# Patient Record
Sex: Male | Born: 1975 | Race: White | Hispanic: No | Marital: Single | Smoking: Former smoker
Health system: Southern US, Community
[De-identification: ages and names within clinical notes are randomized; demographics above are authoritative.]

## PROBLEM LIST (undated history)

## (undated) DIAGNOSIS — L309 Dermatitis, unspecified: Secondary | ICD-10-CM

## (undated) DIAGNOSIS — K222 Esophageal obstruction: Secondary | ICD-10-CM

## (undated) DIAGNOSIS — T7840XA Allergy, unspecified, initial encounter: Secondary | ICD-10-CM

## (undated) DIAGNOSIS — K219 Gastro-esophageal reflux disease without esophagitis: Secondary | ICD-10-CM

## (undated) DIAGNOSIS — Z8489 Family history of other specified conditions: Secondary | ICD-10-CM

## (undated) DIAGNOSIS — S0300XA Dislocation of jaw, unspecified side, initial encounter: Secondary | ICD-10-CM

## (undated) DIAGNOSIS — F419 Anxiety disorder, unspecified: Secondary | ICD-10-CM

## (undated) HISTORY — PX: HERNIA REPAIR: SHX51

## (undated) HISTORY — DX: Allergy, unspecified, initial encounter: T78.40XA

## (undated) HISTORY — DX: Anxiety disorder, unspecified: F41.9

## (undated) HISTORY — DX: Dermatitis, unspecified: L30.9

## (undated) HISTORY — DX: Gastro-esophageal reflux disease without esophagitis: K21.9

## (undated) HISTORY — PX: COLONOSCOPY, ESOPHAGOGASTRODUODENOSCOPY (EGD) AND ESOPHAGEAL DILATION: SHX5781

## (undated) HISTORY — DX: Esophageal obstruction: K22.2

## (undated) HISTORY — PX: ESOPHAGEAL DILATION: SHX303

## (undated) HISTORY — PX: TYMPANOSTOMY TUBE PLACEMENT: SHX32

## (undated) HISTORY — DX: Dislocation of jaw, unspecified side, initial encounter: S03.00XA

---

## 2015-12-23 ENCOUNTER — Encounter: Payer: Self-pay | Admitting: Family Medicine

## 2015-12-23 ENCOUNTER — Ambulatory Visit (INDEPENDENT_AMBULATORY_CARE_PROVIDER_SITE_OTHER): Payer: BC Managed Care – PPO | Admitting: Family Medicine

## 2015-12-23 VITALS — BP 112/73 | HR 71 | Temp 97.8°F | Ht 71.0 in | Wt 191.0 lb

## 2015-12-23 DIAGNOSIS — K9041 Non-celiac gluten sensitivity: Secondary | ICD-10-CM | POA: Diagnosis not present

## 2015-12-23 DIAGNOSIS — K219 Gastro-esophageal reflux disease without esophagitis: Secondary | ICD-10-CM | POA: Diagnosis not present

## 2015-12-23 DIAGNOSIS — Z9109 Other allergy status, other than to drugs and biological substances: Secondary | ICD-10-CM | POA: Diagnosis not present

## 2015-12-23 DIAGNOSIS — Z8719 Personal history of other diseases of the digestive system: Secondary | ICD-10-CM

## 2015-12-23 DIAGNOSIS — F4010 Social phobia, unspecified: Secondary | ICD-10-CM | POA: Diagnosis not present

## 2015-12-23 DIAGNOSIS — L200 Besnier's prurigo: Secondary | ICD-10-CM

## 2015-12-23 DIAGNOSIS — L239 Allergic contact dermatitis, unspecified cause: Secondary | ICD-10-CM

## 2015-12-23 DIAGNOSIS — Z23 Encounter for immunization: Secondary | ICD-10-CM | POA: Diagnosis not present

## 2015-12-23 MED ORDER — TRIAMCINOLONE ACETONIDE 0.5 % EX CREA: 1.0000 "application " | TOPICAL_CREAM | Freq: Two times a day (BID) | CUTANEOUS | Status: DC

## 2015-12-23 MED ORDER — BETAMETHASONE DIPROPIONATE 0.05 % EX CREA: TOPICAL_CREAM | Freq: Two times a day (BID) | CUTANEOUS | Status: DC

## 2015-12-23 MED ORDER — OMEPRAZOLE 20 MG PO CPDR: 20.0000 mg | DELAYED_RELEASE_CAPSULE | Freq: Every day | ORAL | Status: DC

## 2015-12-23 MED ORDER — SERTRALINE HCL 50 MG PO TABS: 50.0000 mg | ORAL_TABLET | Freq: Every day | ORAL | Status: DC

## 2015-12-23 MED ORDER — PROPRANOLOL HCL 40 MG PO TABS: 40.0000 mg | ORAL_TABLET | Freq: Three times a day (TID) | ORAL | Status: DC | PRN

## 2015-12-23 NOTE — Progress Notes (Signed)
BP 112/73 mmHg  Pulse 71  Temp(Src) 97.8 F (36.6 C)  Ht 5\' 11"  (1.803 m)  Wt 191 lb (86.637 kg)  BMI 26.65 kg/m2  SpO2 97%   Subjective:    Patient ID: Joe Patterson, male    DOB: 08/26/1976, 40 y.o.   MRN: [id number]  HPI: Joe RumpsScott W Hollander is a 40 y.o. male  Chief Complaint  Patient presents with  . Establish Care    He just needs to get reestablished and refills.   He has had eczema for maybe ten years; cold weather flares it up; gluten sensitivity flares it up too; betamethasone works well on elbows and knees; has skin allergies, has sensitive skin, uses free and clear laundry detergent; manmade chemicals are worse than pollen, etc.; he has the triamcinolone which is weaker for the eczema on the ears or in other areas on the summer  He has severe allergies, and is exquisitely sensitive to smells including strong colognes, after-shaves, smoke, other smells; letter rquests he be quartered by himself, private quarters; takes zyrtec 10 mg OTC daily; not using nasal spray; the strong colognes and after-shaves are the worst; had to be with a partner on a ride, Public relations account executivecorrectional officer; actually vomited from a severe reaction once in a car; he brought in a letter from his previous provider and asked if I could write something similar (see letter, submitted for scanning, should be found later under media tab)  He has reflux and had an esophageal stricture; he was told to take the PPI forever; he was told to keep on taking it, even if not bothering him because the stricture could come back; trying to eat Paleo with plenty of vegetables; no blood in the stools  He takes propranolol; used to be a Engineer, 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  Review of Systems Per HPI unless specifically indicated above     Objective:    BP 112/73 mmHg  Pulse 71  Temp(Src) 97.8 F (36.6 C)  Ht 5\' 11"  (1.803 m)  Wt 191 lb (86.637 kg)  BMI 26.65 kg/m2  SpO2 97%  Wt Readings from Last 3 Encounters:  12/23/15 191 lb (86.637 kg)    Physical Exam  Constitutional: He appears well-developed and well-nourished. No distress.  HENT:  Head: Normocephalic and atraumatic.  Eyes: EOM are normal. No scleral icterus.  Neck: No thyromegaly present.  Cardiovascular: Normal rate and regular rhythm.   Pulmonary/Chest: Effort normal and breath sounds normal.  Abdominal: Soft. Bowel sounds are normal. He exhibits no distension.  Musculoskeletal: He exhibits no edema.  Neurological: Coordination normal.  Skin: Skin is warm and dry. No pallor.  Psychiatric: He has a normal mood and affect. His behavior is normal. Judgment and thought content normal.      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    And strong odors/chemicals; letter written so patient can avoid/minimize exposure to offending agents      History of esophageal stricture    Discussed risk of long-term use of PPIs; continue, with caveat to consume adequate Mg2+, Ca2+, iron; no red flags at this time      Non-celiac gluten sensitivity    Patient denies celiac disease; avoiding gluten in diet       Other Visit Diagnoses    Need for Tdap vaccination        given today;  tetanus-diphtheria components good for up to 10 years, pertussis booster good for life    Relevant Orders    Tdap vaccine greater than or equal to 7yo IM (Completed)       Follow up plan: No Follow-up on file.  An after-visit summary was printed and given to the patient at check-out.  Please see the patient instructions which may contain other information and recommendations beyond what is mentioned above in the assessment and plan. Meds ordered this encounter  Medications  . DISCONTD: sertraline (ZOLOFT) 50 MG tablet    Sig: Take 50 mg by mouth daily.  Marland Kitchen DISCONTD: betamethasone dipropionate (DIPROLENE) 0.05 % cream    Sig: Apply topically 2 (two) times daily.   Marland Kitchen DISCONTD: omeprazole (PRILOSEC) 20 MG capsule    Sig: Take 20 mg by mouth daily.  Marland Kitchen DISCONTD: triamcinolone cream (KENALOG) 0.5 %    Sig: Apply 1 application topically 2 (two) times daily.  Marland Kitchen DISCONTD: propranolol (INDERAL) 40 MG tablet    Sig: Take 40 mg by mouth as needed.  . cetirizine (ZYRTEC) 10 MG tablet    Sig: Take 10 mg by mouth daily.  Marland Kitchen triamcinolone cream (KENALOG) 0.5 %    Sig: Apply 1 application topically 2 (two) times daily.    Dispense:  30 g    Refill:  11    Okay for either 90 day or 30 day supply, whichever insurance will cover  . sertraline (ZOLOFT) 50 MG tablet    Sig: Take 1 tablet (50 mg total) by mouth daily.    Dispense:  30 tablet    Refill:  11    Okay for either 90 day or 30 day supply, whichever insurance will cover  . propranolol (INDERAL) 40 MG tablet    Sig: Take 1 tablet (40 mg total) by mouth every 8 (eight) hours as needed.    Dispense:  30 tablet    Refill:  11    Okay for either 90 day or 30 day supply, whichever insurance will cover  . omeprazole (PRILOSEC) 20 MG capsule    Sig: Take 1 capsule (20 mg total) by mouth daily.    Dispense:  30 capsule    Refill:  11    Okay for either 90 day or 30 day supply, whichever insurance will cover  . betamethasone dipropionate  (DIPROLENE) 0.05 % cream    Sig: Apply topically 2 (two) times daily.    Dispense:  30 g    Refill:  11    Okay for either 90 day or 30 day supply, whichever insurance will cover   Face-to-face time with patient was more than 30 minutes, >50% time spent counseling and coordination of care

## 2015-12-23 NOTE — Patient Instructions (Signed)
Return for physical when due Your refills have been sent to your pharmacy

## 2015-12-29 DIAGNOSIS — K9041 Non-celiac gluten sensitivity: Secondary | ICD-10-CM

## 2015-12-29 DIAGNOSIS — F4010 Social phobia, unspecified: Secondary | ICD-10-CM | POA: Insufficient documentation

## 2015-12-29 DIAGNOSIS — L239 Allergic contact dermatitis, unspecified cause: Secondary | ICD-10-CM | POA: Insufficient documentation

## 2015-12-29 DIAGNOSIS — K219 Gastro-esophageal reflux disease without esophagitis: Secondary | ICD-10-CM | POA: Insufficient documentation

## 2015-12-29 DIAGNOSIS — Z8719 Personal history of other diseases of the digestive system: Secondary | ICD-10-CM | POA: Insufficient documentation

## 2015-12-29 DIAGNOSIS — Z9109 Other allergy status, other than to drugs and biological substances: Secondary | ICD-10-CM | POA: Insufficient documentation

## 2015-12-29 HISTORY — DX: Non-celiac gluten sensitivity: K90.41

## 2015-12-29 NOTE — Assessment & Plan Note (Signed)
Refills provided of beta-blocker and SSRI; continue same, working well per patient

## 2015-12-29 NOTE — Assessment & Plan Note (Signed)
Topical agents, avoidance of offenders when possible

## 2015-12-29 NOTE — Assessment & Plan Note (Signed)
Patient denies celiac disease; avoiding gluten in diet

## 2015-12-29 NOTE — Assessment & Plan Note (Signed)
Discussed risk of long-term use of PPIs; continue, with caveat to consume adequate Mg2+, Ca2+, iron; no red flags at this time

## 2015-12-29 NOTE — Assessment & Plan Note (Signed)
And strong odors/chemicals; letter written so patient can avoid/minimize exposure to offending agents

## 2016-06-22 ENCOUNTER — Ambulatory Visit
Admission: RE | Admit: 2016-06-22 | Discharge: 2016-06-22 | Disposition: A | Discharge status: Home / Self Care | Payer: Managed Care, Other (non HMO) | Source: Ambulatory Visit | Attending: Family Medicine | Admitting: Family Medicine

## 2016-06-22 ENCOUNTER — Ambulatory Visit (INDEPENDENT_AMBULATORY_CARE_PROVIDER_SITE_OTHER): Payer: Managed Care, Other (non HMO) | Admitting: Family Medicine

## 2016-06-22 ENCOUNTER — Encounter: Payer: Self-pay | Admitting: Family Medicine

## 2016-06-22 VITALS — BP 114/64 | HR 77 | Temp 97.7°F | Resp 14 | Wt 193.0 lb

## 2016-06-22 DIAGNOSIS — M25511 Pain in right shoulder: Secondary | ICD-10-CM | POA: Diagnosis not present

## 2016-06-22 DIAGNOSIS — M25512 Pain in left shoulder: Secondary | ICD-10-CM | POA: Diagnosis present

## 2016-06-22 DIAGNOSIS — M26609 Unspecified temporomandibular joint disorder, unspecified side: Secondary | ICD-10-CM

## 2016-06-22 DIAGNOSIS — L720 Epidermal cyst: Secondary | ICD-10-CM | POA: Diagnosis not present

## 2016-06-22 DIAGNOSIS — Z0000 Encounter for general adult medical examination without abnormal findings: Secondary | ICD-10-CM | POA: Diagnosis not present

## 2016-06-22 HISTORY — DX: Epidermal cyst: L72.0

## 2016-06-22 LAB — CBC WITH DIFFERENTIAL/PLATELET
Basophils Absolute: 0 cells/uL (ref 0–200)
Basophils Relative: 0 %
Eosinophils Absolute: 110 cells/uL (ref 15–500)
Eosinophils Relative: 2 %
HEMATOCRIT: 41.9 % (ref 38.5–50.0)
HEMOGLOBIN: 13.8 g/dL (ref 13.2–17.1)
LYMPHS ABS: 1155 {cells}/uL (ref 850–3900)
Lymphocytes Relative: 21 %
MCH: 29.6 pg (ref 27.0–33.0)
MCHC: 32.9 g/dL (ref 32.0–36.0)
MCV: 89.9 fL (ref 80.0–100.0)
MPV: 10.2 fL (ref 7.5–12.5)
Monocytes Absolute: 440 cells/uL (ref 200–950)
Monocytes Relative: 8 %
NEUTROS PCT: 69 %
Neutro Abs: 3795 cells/uL (ref 1500–7800)
Platelets: 270 10*3/uL (ref 140–400)
RBC: 4.66 MIL/uL (ref 4.20–5.80)
RDW: 13.5 % (ref 11.0–15.0)
WBC: 5.5 10*3/uL (ref 3.8–10.8)

## 2016-06-22 LAB — COMPREHENSIVE METABOLIC PANEL
ALK PHOS: 55 U/L (ref 40–115)
ALT: 26 U/L (ref 9–46)
AST: 22 U/L (ref 10–40)
Albumin: 5 g/dL (ref 3.6–5.1)
BILIRUBIN TOTAL: 0.4 mg/dL (ref 0.2–1.2)
BUN: 8 mg/dL (ref 7–25)
CALCIUM: 9.2 mg/dL (ref 8.6–10.3)
CO2: 24 mmol/L (ref 20–31)
Chloride: 106 mmol/L (ref 98–110)
Creat: 0.78 mg/dL (ref 0.60–1.35)
GLUCOSE: 86 mg/dL (ref 65–99)
Potassium: 4.3 mmol/L (ref 3.5–5.3)
SODIUM: 140 mmol/L (ref 135–146)
Total Protein: 6.7 g/dL (ref 6.1–8.1)

## 2016-06-22 LAB — LIPID PANEL
Cholesterol: 153 mg/dL (ref 125–200)
HDL: 44 mg/dL (ref >=40)
LDL CALC: 93 mg/dL (ref <130)
Total CHOL/HDL Ratio: 3.5 Ratio (ref <=5.0)
Triglycerides: 81 mg/dL (ref <150)
VLDL: 16 mg/dL (ref <30)

## 2016-06-22 NOTE — Progress Notes (Signed)
Patient ID: Joe Patterson, male   DOB: 04/06/1976, 40 y.o.   MRN: [id number]   Subjective:   Joe Patterson is a 40 y.o. male here for a complete physical exam  Interim issues since last visit: tightness in the right shoulder for 24 hours; maybe arthritis; discomfort in other shoulder too; used to sleep on his right side and had trouble for years, and then started sleeping on the left side, and now having trouble with the left shoulder; takes tylenol or ibuprofen for relief; today it's not so bad to need something; mother has a lot of arthritis, lots of joint problems; mother has TMJ; willing to do xrays; does weight lifting and doing overhead presses with dumbbells hurts; no limiting of ROM; hurts later after weight-lifting; no weakness in arms; no electricity or numbness in arms; does a lot of typing during the day; wears wrist braces at night, helping; ibuprofen does not upset stomach  USPSTF grade A and B recommendations Alcohol: no alcohol, none in 16 years Depression:  [facility] screen [facility]/22/2016  Decreased Interest 0  Down, Depressed, Hopeless 0  PHQ - 2 Score 0   Hypertension: well-controlled Obesity: none Tobacco use: former smoker, quit 9 years ago; used nicotine gum then regular gum, chews all the time HIV: declined STD testing and prevention (chl/gon/syphilis): declined Lipids: fasting Glucose: fasting Colorectal cancer: uncle has Crohn's disease Aspirin: n/a Diet: good eater Exercise: weight-lift, warm-ups, jumping jacks to get warmed up Skin cancer: nothing worrisome  Past Medical History  Diagnosis Date  . Anxiety   . Eczema   . GERD (gastroesophageal reflux disease)   . TMJ (dislocation of temporomandibular joint)   . Esophageal stricture    Past Surgical History  Procedure Laterality Date  . Hernia repair    . Tympanostomy tube placement      as a child   Family History  Problem Relation Age of Onset  . Cancer Maternal Grandmother    lymphoma  . Heart disease Maternal Grandfather     early 6440s  . Lung disease Paternal Grandmother   . COPD Neg Hx   . Diabetes Neg Hx   . Hypertension Neg Hx   . Stroke Neg Hx    Social History  Substance Use Topics  . Smoking status: Former Smoker -- 1.00 packs/day for 15 years    Types: Cigarettes    Quit date: 12/23/2007  . Smokeless tobacco: Former NeurosurgeonUser    Types: Chew  . Alcohol Use: No   Review of Systems  Respiratory: Negative for shortness of breath.   Cardiovascular: Negative for chest pain.  Musculoskeletal: Positive for arthralgias (shoulder pain bilaterally).    Objective:   Filed Vitals:   06/22/16 0829  BP: 114/64  Pulse: 77  Temp: 97.7 F (36.5 C)  TempSrc: Oral  Resp: 14  Weight: 193 lb (87.544 kg)  SpO2: 94%   Body mass index is 26.93 kg/(m^2). Wt Readings from Last 3 Encounters:  06/22/16 193 lb (87.544 kg)  12/23/15 191 lb (86.637 kg)   Physical Exam  Constitutional: He appears well-developed and well-nourished. No distress.  HENT:  Head: Normocephalic and atraumatic.  Nose: Nose normal.  Mouth/Throat: Oropharynx is clear and moist.  Eyes: EOM are normal. No scleral icterus.  Neck: No JVD present. No thyromegaly present.  Cardiovascular: Normal rate, regular rhythm and normal heart sounds.   Pulmonary/Chest: Effort normal and breath sounds normal. No respiratory distress. He has no wheezes. He has no rales.  Abdominal: Soft. Bowel sounds are normal. He exhibits no distension. There is no tenderness. There is no guarding.  Musculoskeletal: Normal range of motion. He exhibits no edema.       Right shoulder: He exhibits crepitus. He exhibits normal range of motion, no tenderness and no swelling.       Left shoulder: He exhibits crepitus. He exhibits normal range of motion, no tenderness and no swelling.  Lymphadenopathy:    He has no cervical adenopathy.  Neurological: He is alert. He displays normal reflexes. He exhibits normal muscle tone.  Coordination normal.  Skin: Skin is warm and dry. No rash noted. He is not diaphoretic. No erythema. No pallor.  cystic lesion upper center back; two oval lesions upper right side back with erythema (patient reports these are unchanged for years)  Psychiatric: He has a normal mood and affect. His behavior is normal. Judgment and thought content normal.   Assessment/Plan:   Problem List Items Addressed This Visit      Musculoskeletal and Integument   TMJ (temporomandibular joint syndrome)    Try turmeric as an anti-inflammatory; patient already aware that chewing gum not helpful        Other   Shoulder pain, bilateral    Suspect arthritis; offered xrays; suggested turmeric, tylenol, topicals, temperature (heat or ice)      Relevant Orders   DG Shoulder Left (Completed)   DG Shoulder Right (Completed)   Preventative health care - Primary    USPSTF grade A and B recommendations reviewed with patient; age-appropriate recommendations, preventive care, screening tests, etc discussed and encouraged; healthy living encouraged; see AVS for patient education given to patient      Relevant Orders   Comprehensive Metabolic Panel (CMET) (Completed)   Lipid panel (Completed)   CBC with Differential/Platelet (Completed)   Cyst of skin and subcutaneous tissue    Large cyst upper central back; two other lesions which appear different upper right side back; refer to dermatologist      Relevant Orders   Ambulatory referral to Dermatology      No orders of the defined types were placed in this encounter.   Orders Placed This Encounter  Procedures  . DG Shoulder Left    Order Specific Question:  Reason for Exam (SYMPTOM  OR DIAGNOSIS REQUIRED)    Answer:  bilateral shoulder pain, suspect OA    Order Specific Question:  Preferred imaging location?    Answer:  [facility]  . DG Shoulder Right    Order Specific Question:  Reason for Exam (SYMPTOM  OR DIAGNOSIS REQUIRED)    Answer:   bilateral shoulder pain, suspect OA    Order Specific Question:  Preferred imaging location?    Answer:  [facility]  . Comprehensive Metabolic Panel (CMET)    Order Specific Question:  Has the patient fasted?    Answer:  Yes  . Lipid panel    Order Specific Question:  Has the patient fasted?    Answer:  Yes  . CBC with Differential/Platelet  . Ambulatory referral to Dermatology    Referral Priority:  Routine    Referral Type:  Consultation    Referral Reason:  Specialty Services Required    Requested Specialty:  Dermatology    Number of Visits Requested:  1    Follow up plan: Return in about 1 year (around 06/22/2017) for complete physical.  An After Visit Summary was printed and given to the patient.

## 2016-06-22 NOTE — Assessment & Plan Note (Signed)
Suspect arthritis; offered xrays; suggested turmeric, tylenol, topicals, temperature (heat or ice)

## 2016-06-22 NOTE — Patient Instructions (Addendum)
Let's get xrays of the shoulders today  Try turmeric as a natural anti-inflammatory (for pain and arthritis). It comes in capsules where you buy aspirin and fish oil, but also as a spice where you buy pepper and garlic powder.  Let's get fasting labs today  Health Maintenance, Male A healthy lifestyle and preventative care can promote health and wellness.  Maintain regular health, dental, and eye exams.  Eat a healthy diet. Foods like vegetables, fruits, whole grains, low-fat dairy products, and lean protein foods contain the nutrients you need and are low in calories. Decrease your intake of foods high in solid fats, added sugars, and salt. Get information about a proper diet from your health care provider, if necessary.  Regular physical exercise is one of the most important things you can do for your health. Most adults should get at least 150 minutes of moderate-intensity exercise (any activity that increases your heart rate and causes you to sweat) each week. In addition, most adults need muscle-strengthening exercises on 2 or more days a week.   Maintain a healthy weight. The body mass index (BMI) is a screening tool to identify possible weight problems. It provides an estimate of body fat based on height and weight. Your health care provider can find your BMI and can help you achieve or maintain a healthy weight. For males 20 years and older:  A BMI below 18.5 is considered underweight.  A BMI of 18.5 to 24.9 is normal.  A BMI of 25 to 29.9 is considered overweight.  A BMI of 30 and above is considered obese.  Maintain normal blood lipids and cholesterol by exercising and minimizing your intake of saturated fat. Eat a balanced diet with plenty of fruits and vegetables. Blood tests for lipids and cholesterol should begin at age 39 and be repeated every 5 years. If your lipid or cholesterol levels are high, you are over age 73, or you are at high risk for heart disease, you may need  your cholesterol levels checked more frequently.Ongoing high lipid and cholesterol levels should be treated with medicines if diet and exercise are not working.  If you smoke, find out from your health care provider how to quit. If you do not use tobacco, do not start.  Lung cancer screening is recommended for adults aged 55-80 years who are at high risk for developing lung cancer because of a history of smoking. A yearly low-dose CT scan of the lungs is recommended for people who have at least a 30-pack-year history of smoking and are current smokers or have quit within the past 15 years. A pack year of smoking is smoking an average of 1 pack of cigarettes a day for 1 year (for example, a 30-pack-year history of smoking could mean smoking 1 pack a day for 30 years or 2 packs a day for 15 years). Yearly screening should continue until the smoker has stopped smoking for at least 15 years. Yearly screening should be stopped for people who develop a health problem that would prevent them from having lung cancer treatment.  If you choose to drink alcohol, do not have more than 2 drinks per day. One drink is considered to be 12 oz (360 mL) of beer, 5 oz (150 mL) of wine, or 1.5 oz (45 mL) of liquor.  Avoid the use of street drugs. Do not share needles with anyone. Ask for help if you need support or instructions about stopping the use of drugs.  High blood pressure  causes heart disease and increases the risk of stroke. High blood pressure is more likely to develop in:  People who have blood pressure in the end of the normal range (100-139/85-89 mm Hg).  People who are overweight or obese.  People who are African American.  If you are 2118-39 years of age, have your blood pressure checked every 3-5 years. If you are 39 years of age or older, have your blood pressure checked every year. You should have your blood pressure measured twice--once when you are at a hospital or clinic, and once when you are not  at a hospital or clinic. Record the average of the two measurements. To check your blood pressure when you are not at a hospital or clinic, you can use:  An automated blood pressure machine at a pharmacy.  A home blood pressure monitor.  If you are 8245-39 years old, ask your health care provider if you should take aspirin to prevent heart disease.  Diabetes screening involves taking a blood sample to check your fasting blood sugar level. This should be done once every 3 years after age 39 if you are at a normal weight and without risk factors for diabetes. Testing should be considered at a younger age or be carried out more frequently if you are overweight and have at least 1 risk factor for diabetes.  Colorectal cancer can be detected and often prevented. Most routine colorectal cancer screening begins at the age of 39 and continues through age 39. However, your health care provider may recommend screening at an earlier age if you have risk factors for colon cancer. On a yearly basis, your health care provider may provide home test kits to check for hidden blood in the stool. A small camera at the end of a tube may be used to directly examine the colon (sigmoidoscopy or colonoscopy) to detect the earliest forms of colorectal cancer. Talk to your health care provider about this at age 39 when routine screening begins. A direct exam of the colon should be repeated every 5-10 years through age 39, unless early forms of precancerous polyps or small growths are found.  People who are at an increased risk for hepatitis B should be screened for this virus. You are considered at high risk for hepatitis B if:  You were born in a country where hepatitis B occurs often. Talk with your health care provider about which countries are considered high risk.  Your parents were born in a high-risk country and you have not received a shot to protect against hepatitis B (hepatitis B vaccine).  You have HIV or  AIDS.  You use needles to inject street drugs.  You live with, or have sex with, someone who has hepatitis B.  You are a man who has sex with other men (MSM).  You get hemodialysis treatment.  You take certain medicines for conditions like cancer, organ transplantation, and autoimmune conditions.  Hepatitis C blood testing is recommended for all people born from [id number] through 1965 and any individual with known risk factors for hepatitis C.  Healthy men should no longer receive prostate-specific antigen (PSA) blood tests as part of routine cancer screening. Talk to your health care provider about prostate cancer screening.  Testicular cancer screening is not recommended for adolescents or adult males who have no symptoms. Screening includes self-exam, a health care provider exam, and other screening tests. Consult with your health care provider about any symptoms you have or any concerns you have  about testicular cancer.  Practice safe sex. Use condoms and avoid high-risk sexual practices to reduce the spread of sexually transmitted infections (STIs).  You should be screened for STIs, including gonorrhea and chlamydia if:  You are sexually active and are younger than 24 years.  You are older than 24 years, and your health care provider tells you that you are at risk for this type of infection.  Your sexual activity has changed since you were last screened, and you are at an increased risk for chlamydia or gonorrhea. Ask your health care provider if you are at risk.  If you are at risk of being infected with HIV, it is recommended that you take a prescription medicine daily to prevent HIV infection. This is called pre-exposure prophylaxis (PrEP). You are considered at risk if:  You are a man who has sex with other men (MSM).  You are a heterosexual man who is sexually active with multiple partners.  You take drugs by injection.  You are sexually active with a partner who has  HIV.  Talk with your health care provider about whether you are at high risk of being infected with HIV. If you choose to begin PrEP, you should first be tested for HIV. You should then be tested every 3 months for as long as you are taking PrEP.  Use sunscreen. Apply sunscreen liberally and repeatedly throughout the day. You should seek shade when your shadow is shorter than you. Protect yourself by wearing long sleeves, pants, a wide-brimmed hat, and sunglasses year round whenever you are outdoors.  Tell your health care provider of new moles or changes in moles, especially if there is a change in shape or color. Also, tell your health care provider if a mole is larger than the size of a pencil eraser.  A one-time screening for abdominal aortic aneurysm (AAA) and surgical repair of large AAAs by ultrasound is recommended for men aged 65-75 years who are current or former smokers.  Stay current with your vaccines (immunizations).   This information is not intended to replace advice given to you by your health care provider. Make sure you discuss any questions you have with your health care provider.   Document Released: 05/26/2008 Document Revised: 12/19/2014 Document Reviewed: 04/25/2011 Elsevier Interactive Patient Education [employer]

## 2016-06-23 NOTE — Assessment & Plan Note (Signed)
Try turmeric as an anti-inflammatory; patient already aware that chewing gum not helpful

## 2016-06-23 NOTE — Assessment & Plan Note (Signed)
Large cyst upper central back; two other lesions which appear different upper right side back; refer to dermatologist

## 2016-06-23 NOTE — Assessment & Plan Note (Signed)
USPSTF grade A and B recommendations reviewed with patient; age-appropriate recommendations, preventive care, screening tests, etc discussed and encouraged; healthy living encouraged; see AVS for patient education given to patient  

## 2016-10-06 ENCOUNTER — Ambulatory Visit (INDEPENDENT_AMBULATORY_CARE_PROVIDER_SITE_OTHER): Payer: Managed Care, Other (non HMO) | Admitting: Family Medicine

## 2016-10-06 ENCOUNTER — Encounter: Payer: Self-pay | Admitting: Family Medicine

## 2016-10-06 VITALS — BP 128/82 | HR 93 | Temp 100.7°F | Resp 14 | Ht 71.0 in | Wt 201.4 lb

## 2016-10-06 DIAGNOSIS — R509 Fever, unspecified: Secondary | ICD-10-CM | POA: Diagnosis not present

## 2016-10-06 DIAGNOSIS — J111 Influenza due to unidentified influenza virus with other respiratory manifestations: Secondary | ICD-10-CM

## 2016-10-06 DIAGNOSIS — R69 Illness, unspecified: Secondary | ICD-10-CM | POA: Diagnosis not present

## 2016-10-06 MED ORDER — OSELTAMIVIR PHOSPHATE 75 MG PO CAPS: 75.0000 mg | ORAL_CAPSULE | Freq: Two times a day (BID) | ORAL | 0 refills | Status: AC

## 2016-10-06 NOTE — Progress Notes (Signed)
BP 128/82 (BP Location: Left Arm, Patient Position: Sitting, Cuff Size: Normal)   Pulse 93   Temp (!) 100.7 F (38.2 C) (Oral)   Resp 14   Ht '5\' 11"'  (1.803 m)   Wt 201 lb 6 oz (91.3 kg)   SpO2 96%   BMI 28.09 kg/m    Subjective:    Patient ID: Joe Patterson, male    DOB: 24-Aug-1976, 40 y.o.   MRN: [id number]  HPI: Joe Patterson is a 40 y.o. male  Chief Complaint  Patient presents with  . 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 Anxiety   . Eczema   . Esophageal stricture   . GERD (gastroesophageal reflux disease)   . TMJ (dislocation of temporomandibular joint)    Past Surgical History:  Procedure Laterality Date  . HERNIA REPAIR    . TYMPANOSTOMY TUBE PLACEMENT     as a child   Social History  Substance Use Topics  . Smoking status: Former Smoker    Packs/day: 1.00    Years: 15.00    Types: Cigarettes    Quit date: 12/23/2007  . Smokeless tobacco: Former Systems developer    Types: Chew  . Alcohol use No   Interim medical history since last visit reviewed. Allergies and medications reviewed  Review of Systems Per HPI unless  specifically indicated above     Objective:    BP 128/82 (BP Location: Left Arm, Patient Position: Sitting, Cuff Size: Normal)   Pulse 93   Temp (!) 100.7 F (38.2 C) (Oral)   Resp 14   Ht '5\' 11"'  (1.803 m)   Wt 201 lb 6 oz (91.3 kg)   SpO2 96%   BMI 28.09 kg/m   Wt Readings from Last 3 Encounters:  10/06/16 201 lb 6 oz (91.3 kg)  06/22/16 193 lb (87.5 kg)  12/23/15 191 lb (86.6 kg)    Physical Exam  Constitutional: He appears well-developed and well-nourished. No distress.  HENT:  Head: Normocephalic and atraumatic.  Right Ear: External ear normal.  Left Ear: External ear normal.  Nose: Nose normal.  Mouth/Throat: Oropharynx is clear and moist. No oropharyngeal exudate.  Eyes: Conjunctivae and EOM are normal. Right eye exhibits no discharge. Left eye exhibits no discharge. No scleral icterus.  Neck: Normal range of motion. Neck supple. No thyromegaly present.  Cardiovascular: Normal rate and regular rhythm.   No extrasystoles are present.  No murmur heard. Pulmonary/Chest: Effort normal and breath sounds normal. He has no decreased breath sounds. He has no wheezes. He has no rhonchi.  Abdominal: Soft. Bowel  sounds are normal. He exhibits no distension.  Musculoskeletal: He exhibits no edema.  Lymphadenopathy:    He has no cervical adenopathy.  Neurological: He is alert. He displays no tremor. No cranial nerve deficit. Coordination and gait normal.  Negative Kernigs and brudzkinski's signs  Skin: Skin is warm and dry. No rash noted. He is not diaphoretic. No pallor.  Psychiatric: He has a normal mood and affect. His behavior is normal. Judgment and thought content normal.   Results for orders placed or performed in visit on 06/22/16  Comprehensive Metabolic Panel (CMET)  Result Value Ref Range   Sodium 140 135 - 146 mmol/L   Potassium 4.3 3.5 - 5.3 mmol/L   Chloride 106 98 - 110 mmol/L   CO2 24 20 - 31 mmol/L   Glucose, Bld 86 65 - 99 mg/dL   BUN 8 7 - 25 mg/dL    Creat 0.78 0.60 - 1.35 mg/dL   Total Bilirubin 0.4 0.2 - 1.2 mg/dL   Alkaline Phosphatase 55 40 - 115 U/L   AST 22 10 - 40 U/L   ALT 26 9 - 46 U/L   Total Protein 6.7 6.1 - 8.1 g/dL   Albumin 5.0 3.6 - 5.1 g/dL   Calcium 9.2 8.6 - 10.3 mg/dL  Lipid panel  Result Value Ref Range   Cholesterol 153 125 - 200 mg/dL   Triglycerides 81 <150 mg/dL   HDL 44 >=40 mg/dL   Total CHOL/HDL Ratio 3.5 <=5.0 Ratio   VLDL 16 <30 mg/dL   LDL Cholesterol 93 <130 mg/dL  CBC with Differential/Platelet  Result Value Ref Range   WBC 5.5 3.8 - 10.8 K/uL   RBC 4.66 4.20 - 5.80 MIL/uL   Hemoglobin 13.8 13.2 - 17.1 g/dL   HCT 41.9 38.5 - 50.0 %   MCV 89.9 80.0 - 100.0 fL   MCH 29.6 27.0 - 33.0 pg   MCHC 32.9 32.0 - 36.0 g/dL   RDW 13.5 11.0 - 15.0 %   Platelets 270 140 - 400 K/uL   MPV 10.2 7.5 - 12.5 fL   Neutro Abs 3,795 1,500 - 7,800 cells/uL   Lymphs Abs 1,155 850 - 3,900 cells/uL   Monocytes Absolute 440 200 - 950 cells/uL   Eosinophils Absolute 110 15 - 500 cells/uL   Basophils Absolute 0 0 - 200 cells/uL   Neutrophils Relative % 69 %   Lymphocytes Relative 21 %   Monocytes Relative 8 %   Eosinophils Relative 2 %   Basophils Relative 0 %   Smear Review Criteria for review not met       Assessment & Plan:   Problem List Items Addressed This Visit      Other   Fever    influenza-liek illness; reasons to seek help reviewed; start tamiflu; hydration, rest, out of work, contagious until afebrile x 48 hours       Other Visit Diagnoses    Influenza-like illness    -  Primary   staff initially reported positive flu, then said it was negative; will treat with tamiflu given clinical presentation; reviewed warnings; reasons to seek help      Follow up plan: Return if symptoms worsen or fail to improve.  An after-visit summary was printed and given to the patient at [facility].  Please see the patient instructions which may contain other information and recommendations beyond what is  mentioned above in the assessment and plan.  Meds ordered this encounter  Medications  . oseltamivir (TAMIFLU)  75 MG capsule    Sig: Take 1 capsule (75 mg total) by mouth 2 (two) times daily.    Dispense:  10 capsule    Refill:  0

## 2016-10-06 NOTE — Patient Instructions (Addendum)

## 2016-10-07 ENCOUNTER — Ambulatory Visit: Payer: Managed Care, Other (non HMO) | Admitting: Family Medicine

## 2016-10-10 ENCOUNTER — Ambulatory Visit
Admission: RE | Admit: 2016-10-10 | Discharge: 2016-10-10 | Disposition: A | Discharge status: Home / Self Care | Payer: Managed Care, Other (non HMO) | Source: Ambulatory Visit | Attending: Family Medicine | Admitting: Family Medicine

## 2016-10-10 ENCOUNTER — Encounter: Payer: Self-pay | Admitting: Family Medicine

## 2016-10-10 ENCOUNTER — Telehealth: Payer: Self-pay | Admitting: Family Medicine

## 2016-10-10 DIAGNOSIS — R05 Cough: Secondary | ICD-10-CM | POA: Insufficient documentation

## 2016-10-10 DIAGNOSIS — R509 Fever, unspecified: Secondary | ICD-10-CM | POA: Insufficient documentation

## 2016-10-10 DIAGNOSIS — R059 Cough, unspecified: Secondary | ICD-10-CM

## 2016-10-10 NOTE — Assessment & Plan Note (Signed)
xray

## 2016-10-10 NOTE — Telephone Encounter (Signed)
Pt.notified

## 2016-10-10 NOTE — Telephone Encounter (Signed)
Please advise 

## 2016-10-10 NOTE — Telephone Encounter (Signed)
Pt states he came in last Thursday 10/06/16 and was diagnosed with the flu. Pt states he has taken all of the Tamiflu and pt states he still has fever of 101.0 and he is having tightness in chest and feels no better than he did last week. Pt would like a call back to be advised.

## 2016-10-10 NOTE — Telephone Encounter (Signed)
Please thank him for calling Ask him to go to get a CXR across the street and we'll make sure he doesn't have pneumonia Visit to urgent care if worse

## 2016-10-13 NOTE — Assessment & Plan Note (Signed)
influenza-liek illness; reasons to seek help reviewed; start tamiflu; hydration, rest, out of work, contagious until afebrile x 48 hours

## 2016-12-15 ENCOUNTER — Ambulatory Visit: Payer: Managed Care, Other (non HMO) | Admitting: Family Medicine

## 2016-12-15 ENCOUNTER — Ambulatory Visit (INDEPENDENT_AMBULATORY_CARE_PROVIDER_SITE_OTHER): Payer: Managed Care, Other (non HMO) | Admitting: Family Medicine

## 2016-12-15 ENCOUNTER — Encounter: Payer: Self-pay | Admitting: Family Medicine

## 2016-12-15 DIAGNOSIS — F4010 Social phobia, unspecified: Secondary | ICD-10-CM

## 2016-12-15 DIAGNOSIS — K219 Gastro-esophageal reflux disease without esophagitis: Secondary | ICD-10-CM

## 2016-12-15 MED ORDER — TRIAMCINOLONE ACETONIDE 0.5 % EX CREA: 1.0000 "application " | TOPICAL_CREAM | Freq: Two times a day (BID) | CUTANEOUS | 11 refills | Status: DC

## 2016-12-15 MED ORDER — TRIAMCINOLONE ACETONIDE 0.1 % EX CREA: 1.0000 "application " | TOPICAL_CREAM | Freq: Two times a day (BID) | CUTANEOUS | 11 refills | Status: DC

## 2016-12-15 MED ORDER — SERTRALINE HCL 50 MG PO TABS: 50.0000 mg | ORAL_TABLET | Freq: Every day | ORAL | 3 refills | Status: DC

## 2016-12-15 MED ORDER — PROPRANOLOL HCL 40 MG PO TABS: 80.0000 mg | ORAL_TABLET | Freq: Two times a day (BID) | ORAL | 3 refills | Status: DC | PRN

## 2016-12-15 MED ORDER — OMEPRAZOLE 20 MG PO CPDR: 20.0000 mg | DELAYED_RELEASE_CAPSULE | Freq: Every day | ORAL | 3 refills | Status: DC

## 2016-12-15 NOTE — Progress Notes (Signed)
BP 114/82   Pulse 78   Temp 98 F (36.7 C) (Oral)   Resp 14   Wt 197 lb (89.4 kg)   SpO2 95%   BMI 27.48 kg/m    Subjective:    Patient ID: Joe Patterson, male    DOB: 08/09/1976, 41 y.o.   MRN: [id number]  HPI: Joe Patterson is a 41 y.o. male  Chief Complaint  Patient presents with  . Medication Refill  . Follow-up   GERD, esophageal stricture hx; on PPI Went to the dentist and the dentist saw evidence of reflux in his teeth; elevated HOB Silent acid reflux; too many chili beans did cause symptoms once, but mostly silent Hx of esophageal dilatation, maybe 7 years ago; not getting hung up now, nothing similar Food was sticking mid chest, but not now  Anxiety; on sertraline; can't really tell any problems with that; used to take 150 mg daily years ago, managed to get down to 50 mg but did not do well going any lower on the dose; his med list says that he takes propranolol 80 mg three times a day; needing a lot of coffee to do his job, then has meetings and has to take more beta-blocker; if he has an afternoon meeting, then he takes 120 mg in afternoon; he only takes the propranolol only for social anxiety and only at work or parties; he talked to his sister who is a Teacher, early years/prepharmacist and she said it was okay to take it this way he says; he monitors his pulse; does not go below 60, no problems with 120 mg of propranolol at a time  Eczema; under pretty good control; he went to dermatologist, they gave him 0.1% and he would like both, some more mild for face if needed  [facility] 2[facility] 12/15/2016 10/06/2016 06/22/2016  Decreased Interest 0 0 0  Down, Depressed, Hopeless 0 0 0  PHQ - 2 Score 0 0 0   Relevant past medical, surgical, family and social history reviewed Past Medical History:  Diagnosis Date  . Anxiety   . Eczema   . Esophageal stricture   . GERD (gastroesophageal reflux disease)   . TMJ (dislocation of temporomandibular joint)    Past Surgical History:    Procedure Laterality Date  . HERNIA REPAIR    . TYMPANOSTOMY TUBE PLACEMENT     as a child   Family History  Problem Relation Age of Onset  . Cancer Maternal Grandmother     lymphoma  . Heart disease Maternal Grandfather     early 3540s  . Lung disease Paternal Grandmother   . COPD Neg Hx   . Diabetes Neg Hx   . Hypertension Neg Hx   . Stroke Neg Hx    Social History  Substance Use Topics  . Smoking status: Former Smoker    Packs/day: 1.00    Years: 15.00    Types: Cigarettes    Quit date: 12/23/2007  . Smokeless tobacco: Former NeurosurgeonUser    Types: Chew  . Alcohol use No   Interim medical history since last visit reviewed. Allergies and medications reviewed  Review of Systems Per HPI unless specifically indicated above     Objective:    BP 114/82   Pulse 78   Temp 98 F (36.7 C) (Oral)   Resp 14   Wt 197 lb (89.4 kg)   SpO2 95%   BMI 27.48 kg/m   Wt Readings from Last 3 Encounters:  12/15/16  197 lb (89.4 kg)  10/06/16 201 lb 6 oz (91.3 kg)  06/22/16 193 lb (87.5 kg)    Physical Exam  Constitutional: He appears well-developed and well-nourished. No distress.  Eyes: EOM are normal. No scleral icterus.  Cardiovascular: Normal rate, regular rhythm and normal heart sounds.   Pulmonary/Chest: Effort normal and breath sounds normal. No respiratory distress. He has no wheezes. He has no rales.  Abdominal: Soft. Bowel sounds are normal. He exhibits no distension. There is no tenderness. There is no guarding.  Musculoskeletal: Normal range of motion. He exhibits no edema.  Neurological: He is alert. He displays normal reflexes. He exhibits normal muscle tone. Coordination normal.  Skin: Skin is warm and dry. No rash noted. He is not diaphoretic. No pallor.  Psychiatric: He has a normal mood and affect. His behavior is normal. Judgment and thought content normal. His mood appears not anxious. He does not exhibit a depressed mood.      Assessment & Plan:   Problem List  Items Addressed This Visit      Digestive   Gastroesophageal reflux    Continue current therapy with PPI; cautioned about risk; back to GI if s/s of stricture recur      Relevant Medications   omeprazole (PRILOSEC) 20 MG capsule     Other   Social anxiety disorder    Once we clarified how he is taking his propranolol, will continue same PRN dose; cautions given, and I reviewed with him max doses; monitor pulse; he agrees; continue the SSRI          Follow up plan: Return in about 6 months (around 06/14/2017) for complete physical.  An after-visit summary was printed and given to the patient at check-out.  Please see the patient instructions which may contain other information and recommendations beyond what is mentioned above in the assessment and plan.  Meds ordered this encounter  Medications  . omeprazole (PRILOSEC) 20 MG capsule    Sig: Take 1 capsule (20 mg total) by mouth daily.    Dispense:  90 capsule    Refill:  3    Okay for either 90 day or 30 day supply, whichever insurance will cover  . sertraline (ZOLOFT) 50 MG tablet    Sig: Take 1 tablet (50 mg total) by mouth daily.    Dispense:  90 tablet    Refill:  3    Okay for either 90 day or 30 day supply, whichever insurance will cover  . propranolol (INDERAL) 40 MG tablet    Sig: Take 2 tablets (80 mg total) by mouth every 12 (twelve) hours as needed.    Dispense:  200 tablet    Refill:  3    Okay for either 90 day or 30 day supply, whichever insurance will cover  . triamcinolone cream (KENALOG) 0.5 %    Sig: Apply 1 application topically 2 (two) times daily. (stronger cream)    Dispense:  30 g    Refill:  11    Okay for either 90 day or 30 day supply, whichever insurance will cover  . triamcinolone cream (KENALOG) 0.1 %    Sig: Apply 1 application topically 2 (two) times daily. If needed (weaker strength)    Dispense:  30 g    Refill:  11    No orders of the defined types were placed in this  encounter.

## 2016-12-18 NOTE — Assessment & Plan Note (Signed)
Continue current therapy with PPI; cautioned about risk; back to GI if s/s of stricture recur

## 2016-12-18 NOTE — Assessment & Plan Note (Addendum)
Once we clarified how he is taking his propranolol, will continue same PRN dose; cautions given, and I reviewed with him max doses; monitor pulse; he agrees; continue the SSRI

## 2017-02-22 ENCOUNTER — Telehealth: Payer: Self-pay | Admitting: Family Medicine

## 2017-02-22 NOTE — Telephone Encounter (Signed)
Pt was told if he needed the cream for his ezema [facility] to [facility]

## 2017-02-23 NOTE — Telephone Encounter (Signed)
It looks like I approved a year of each of the strengths; please have him contact pharmacy

## 2017-02-23 NOTE — Telephone Encounter (Signed)
Left detailed voicemail

## 2017-02-27 ENCOUNTER — Encounter: Payer: Self-pay | Admitting: Family Medicine

## 2017-03-01 ENCOUNTER — Telehealth: Payer: Self-pay

## 2017-03-01 MED ORDER — BETAMETHASONE DIPROPIONATE 0.05 % EX CREA: TOPICAL_CREAM | Freq: Two times a day (BID) | CUTANEOUS | 1 refills | Status: DC

## 2017-03-01 NOTE — Telephone Encounter (Signed)
done

## 2017-03-01 NOTE — Telephone Encounter (Signed)
Requesting rx refill on betmethasone 0.05%

## 2017-05-23 ENCOUNTER — Other Ambulatory Visit: Payer: Self-pay

## 2017-05-23 NOTE — Telephone Encounter (Signed)
Left voicemail with pharmacy 

## 2017-05-23 NOTE — Telephone Encounter (Signed)
I sent in a prescription in January for 30 grams with eleven refills He should not be out of this already If so, please recommend that he see a dermatologist

## 2017-06-20 ENCOUNTER — Encounter: Payer: Managed Care, Other (non HMO) | Admitting: Family Medicine

## 2017-07-05 ENCOUNTER — Encounter: Payer: Self-pay | Admitting: Family Medicine

## 2017-07-05 ENCOUNTER — Ambulatory Visit (INDEPENDENT_AMBULATORY_CARE_PROVIDER_SITE_OTHER): Payer: Managed Care, Other (non HMO) | Admitting: Family Medicine

## 2017-07-05 VITALS — BP 132/70 | HR 83 | Temp 97.9°F | Resp 16 | Ht 70.03 in | Wt 211.6 lb

## 2017-07-05 DIAGNOSIS — F4010 Social phobia, unspecified: Secondary | ICD-10-CM

## 2017-07-05 DIAGNOSIS — Z9109 Other allergy status, other than to drugs and biological substances: Secondary | ICD-10-CM

## 2017-07-05 DIAGNOSIS — Z0000 Encounter for general adult medical examination without abnormal findings: Secondary | ICD-10-CM

## 2017-07-05 DIAGNOSIS — K219 Gastro-esophageal reflux disease without esophagitis: Secondary | ICD-10-CM | POA: Diagnosis not present

## 2017-07-05 DIAGNOSIS — L239 Allergic contact dermatitis, unspecified cause: Secondary | ICD-10-CM | POA: Diagnosis not present

## 2017-07-05 HISTORY — DX: Encounter for general adult medical examination without abnormal findings: Z00.00

## 2017-07-05 LAB — COMPLETE METABOLIC PANEL WITH GFR
ALBUMIN: 4.7 g/dL (ref 3.6–5.1)
ALK PHOS: 43 U/L (ref 40–115)
ALT: 42 U/L (ref 9–46)
AST: 27 U/L (ref 10–40)
BILIRUBIN TOTAL: 0.6 mg/dL (ref 0.2–1.2)
BUN: 14 mg/dL (ref 7–25)
CO2: 26 mmol/L (ref 20–31)
Calcium: 9.4 mg/dL (ref 8.6–10.3)
Chloride: 105 mmol/L (ref 98–110)
Creat: 0.87 mg/dL (ref 0.60–1.35)
GFR, Est African American: >89 mL/min (ref >=60)
GFR, Est Non African American: >89 mL/min (ref >=60)
Glucose, Bld: 88 mg/dL (ref 65–99)
Potassium: 4 mmol/L (ref 3.5–5.3)
SODIUM: 140 mmol/L (ref 135–146)
TOTAL PROTEIN: 6.7 g/dL (ref 6.1–8.1)

## 2017-07-05 LAB — LIPID PANEL
CHOL/HDL RATIO: 4.1 ratio (ref <5.0)
Cholesterol: 196 mg/dL (ref <200)
HDL: 48 mg/dL (ref >40)
LDL Cholesterol: 127 mg/dL — ABNORMAL HIGH (ref <100)
Triglycerides: 106 mg/dL (ref <150)
VLDL: 21 mg/dL (ref <30)

## 2017-07-05 LAB — CBC WITH DIFFERENTIAL/PLATELET
BASOS PCT: 0 %
Basophils Absolute: 0 cells/uL (ref 0–200)
EOS PCT: 2 %
Eosinophils Absolute: 112 cells/uL (ref 15–500)
HCT: 41.4 % (ref 38.5–50.0)
Hemoglobin: 13.7 g/dL (ref 13.2–17.1)
Lymphocytes Relative: 18 %
Lymphs Abs: 1008 cells/uL (ref 850–3900)
MCH: 29.8 pg (ref 27.0–33.0)
MCHC: 33.1 g/dL (ref 32.0–36.0)
MCV: 90 fL (ref 80.0–100.0)
MPV: 8.9 fL (ref 7.5–12.5)
Monocytes Absolute: 616 cells/uL (ref 200–950)
Monocytes Relative: 11 %
NEUTROS ABS: 3864 {cells}/uL (ref 1500–7800)
Neutrophils Relative %: 69 %
Platelets: 260 10*3/uL (ref 140–400)
RBC: 4.6 MIL/uL (ref 4.20–5.80)
RDW: 13 % (ref 11.0–15.0)
WBC: 5.6 10*3/uL (ref 3.8–10.8)

## 2017-07-05 LAB — TSH: TSH: 1.75 m[IU]/L (ref 0.40–4.50)

## 2017-07-05 MED ORDER — TRIAMCINOLONE ACETONIDE 0.1 % EX CREA: 1.0000 "application " | TOPICAL_CREAM | Freq: Two times a day (BID) | CUTANEOUS | 11 refills | Status: DC

## 2017-07-05 MED ORDER — BETAMETHASONE DIPROPIONATE 0.05 % EX CREA: TOPICAL_CREAM | Freq: Two times a day (BID) | CUTANEOUS | 5 refills | Status: DC

## 2017-07-05 NOTE — Patient Instructions (Addendum)
We'll get labs today If you have not heard anything from my staff in a week about any orders/referrals/studies from today, please contact us here to follow-up [phone]   Health Maintenance, Male A healthy lifestyle and preventive care is important for your health and wellness. Ask your health care provider about what schedule of regular examinations is right for you. What should I know about weight and diet? Eat a Healthy Diet  Eat plenty of vegetables, fruits, whole grains, low-fat dairy products, and lean protein.  Do not eat a lot of foods high in solid fats, added sugars, or salt.  Maintain a Healthy Weight Regular exercise can help you achieve or maintain a healthy weight. You should:  Do at least 150 minutes of exercise each week. The exercise should increase your heart rate and make you sweat (moderate-intensity exercise).  Do strength-training exercises at least twice a week.  Watch Your Levels of Cholesterol and Blood Lipids  Have your blood tested for lipids and cholesterol every 5 years starting at 40 years of age. If you are at high risk for heart disease, you should start having your blood tested when you are 40 years old. You may need to have your cholesterol levels checked more often if: ? Your lipid or cholesterol levels are high. ? You are older than 40 years of age. ? You are at high risk for heart disease.  What should I know about cancer screening? Many types of cancers can be detected early and may often be prevented. Lung Cancer  You should be screened every year for lung cancer if: ? You are a current smoker who has smoked for at least 30 years. ? You are a former smoker who has quit within the past 15 years.  Talk to your health care provider about your screening options, when you should start screening, and how often you should be screened.  Colorectal Cancer  Routine colorectal cancer screening usually begins at 40 years of age and should be  repeated every 5-10 years until you are 40 years old. You may need to be screened more often if early forms of precancerous polyps or small growths are found. Your health care provider may recommend screening at an earlier age if you have risk factors for colon cancer.  Your health care provider may recommend using home test kits to check for hidden blood in the stool.  A small camera at the end of a tube can be used to examine your colon (sigmoidoscopy or colonoscopy). This checks for the earliest forms of colorectal cancer.  Prostate and Testicular Cancer  Depending on your age and overall health, your health care provider may do certain tests to screen for prostate and testicular cancer.  Talk to your health care provider about any symptoms or concerns you have about testicular or prostate cancer.  Skin Cancer  Check your skin from head to toe regularly.  Tell your health care provider about any new moles or changes in moles, especially if: ? There is a change in a mole's size, shape, or color. ? You have a mole that is larger than a pencil eraser.  Always use sunscreen. Apply sunscreen liberally and repeat throughout the day.  Protect yourself by wearing long sleeves, pants, a wide-brimmed hat, and sunglasses when outside.  What should I know about heart disease, diabetes, and high blood pressure?  If you are 18-39 years of age, have your blood pressure checked every 3-5 years. If you are 40   years of age or older, have your blood pressure checked every year. You should have your blood pressure measured twice-once when you are at a hospital or clinic, and once when you are not at a hospital or clinic. Record the average of the two measurements. To check your blood pressure when you are not at a hospital or clinic, you can use: ? An automated blood pressure machine at a pharmacy. ? A home blood pressure monitor.  Talk to your health care provider about your target blood  pressure.  If you are between 45-79 years old, ask your health care provider if you should take aspirin to prevent heart disease.  Have regular diabetes screenings by checking your fasting blood sugar level. ? If you are at a normal weight and have a low risk for diabetes, have this test once every three years after the age of 45. ? If you are overweight and have a high risk for diabetes, consider being tested at a younger age or more often.  A one-time screening for abdominal aortic aneurysm (AAA) by ultrasound is recommended for men aged 65-75 years who are current or former smokers. What should I know about preventing infection? Hepatitis B If you have a higher risk for hepatitis B, you should be screened for this virus. Talk with your health care provider to find out if you are at risk for hepatitis B infection. Hepatitis C Blood testing is recommended for:  Everyone born from 1945 through 1965.  Anyone with known risk factors for hepatitis C.  Sexually Transmitted Diseases (STDs)  You should be screened each year for STDs including gonorrhea and chlamydia if: ? You are sexually active and are younger than 40 years of age. ? You are older than 40 years of age and your health care provider tells you that you are at risk for this type of infection. ? Your sexual activity has changed since you were last screened and you are at an increased risk for chlamydia or gonorrhea. Ask your health care provider if you are at risk.  Talk with your health care provider about whether you are at high risk of being infected with HIV. Your health care provider may recommend a prescription medicine to help prevent HIV infection.  What else can I do?  Schedule regular health, dental, and eye exams.  Stay current with your vaccines (immunizations).  Do not use any tobacco products, such as cigarettes, chewing tobacco, and e-cigarettes. If you need help quitting, ask your health care  provider.  Limit alcohol intake to no more than 2 drinks per day. One drink equals 12 ounces of beer, 5 ounces of wine, or 1 ounces of hard liquor.  Do not use street drugs.  Do not share needles.  Ask your health care provider for help if you need support or information about quitting drugs.  Tell your health care provider if you often feel depressed.  Tell your health care provider if you have ever been abused or do not feel safe at home. This information is not intended to replace advice given to you by your health care provider. Make sure you discuss any questions you have with your health care provider. Document Released: 05/26/2008 Document Revised: 07/27/2016 Document Reviewed: 09/01/2015 Elsevier Interactive Patient Education  2018 [employer]  

## 2017-07-05 NOTE — Assessment & Plan Note (Signed)
Using the beta-blocker

## 2017-07-05 NOTE — Progress Notes (Signed)
Patient ID: Joe Patterson, male   DOB: 10/28/1976, 41 y.o.   MRN: [id number]   Subjective:   Joe Patterson is a 41 y.o. male here for a complete physical exam  Interim issues since last visit: saw dermatologist, they did a skin exam Also to discuss his medicines He uses the steroid creams for eczema; allergic to strong scents, perfumes etc; needs another note for work  GERD; he switched to taking the PPI at night; dentist noticed the acid reflux marks on his teeth; now taking at night; elevated HOB slightly; silent, so not aware of triggers; extra fluoride toothpaste  Social anxiety; on SSRI; using beta-blocker and dosing per needs; no slow heart rate or feeling overly tired  Using RYR extract for cholesterol; trying to avoid processed meats  -------------------------------------------------------- USPSTF grade A and B recommendations Depression:  [facility] 07/05/2017 12/15/2016 10/06/2016 06/22/2016  Decreased Interest 0 0 0 0  Down, Depressed, Hopeless 0 0 0 0  PHQ - 2 Score 0 0 0 0   Hypertension: BP Readings from Last 3 Encounters:  07/05/17 132/70  12/15/16 114/82  10/06/16 128/82   Obesity: max weight was 230 pounds, got down, back on bread now Wt Readings from Last 3 Encounters:  07/05/17 211 lb 9.6 oz (96 kg)  12/15/16 197 lb (89.4 kg)  10/06/16 201 lb 6 oz (91.3 kg)   BMI Readings from Last 3 Encounters:  07/05/17 30.34 kg/m  12/15/16 27.48 kg/m  10/06/16 28.09 kg/m    Alcohol: no Tobacco use: n/a HIV, hep B, hep C: declined Single STD testing and prevention (chl/gon/syphilis): test Lipids:  Lab Results  Component Value Date   CHOL 153 06/22/2016   Lab Results  Component Value Date   HDL 44 06/22/2016   Lab Results  Component Value Date   LDLCALC 93 06/22/2016   Lab Results  Component Value Date   TRIG 81 06/22/2016   Lab Results  Component Value Date   CHOLHDL 3.5 06/22/2016   No results found for: LDLDIRECT Glucose:   Glucose, Bld  Date Value Ref Range Status  06/22/2016 86 65 - 99 mg/dL Final   Colorectal cancer: start at age 40 per 3 groups, 45 per 1 group Prostate cancer: n/a No results found for: PSA Lung cancer:  n/a AAA: n/a Aspirin: n/a Diet: pretty good diet, eating too much; not many sweets, lots of bread Exercise: probably not, trying though, trying to do stuff that's good for his back Skin cancer: no new moles   Past Medical History:  Diagnosis Date  . Anxiety   . Eczema   . Esophageal stricture   . GERD (gastroesophageal reflux disease)   . TMJ (dislocation of temporomandibular joint)    Past Surgical History:  Procedure Laterality Date  . HERNIA REPAIR    . TYMPANOSTOMY TUBE PLACEMENT     as a child   Family History  Problem Relation Age of Onset  . Multiple sclerosis Mother   . Hearing loss Father   . Cancer Maternal Grandmother        lymphoma  . Heart disease Maternal Grandfather        early 8740s  . Lung disease Paternal Grandmother   . Blindness Paternal Grandmother   . Parkinson's disease Paternal Grandfather   . COPD Neg Hx   . Diabetes Neg Hx   . Hypertension Neg Hx   . Stroke Neg Hx   . Colon cancer Neg Hx    Social  History  Substance Use Topics  . Smoking status: Former Smoker    Packs/day: 1.00    Years: 15.00    Types: Cigarettes    Quit date: 12/23/2007  . Smokeless tobacco: Former Neurosurgeon    Types: Chew    Quit date: 2009  . Alcohol use No   Review of Systems  Constitutional: Positive for unexpected weight change.  HENT: Positive for hearing loss (just a little bit, maybe concerts and loud music, some hereditary).   Eyes: Negative for visual disturbance.  Respiratory: Negative for shortness of breath.   Cardiovascular: Negative for chest pain.  Gastrointestinal: Positive for blood in stool (hemorrhoids, has been checked out).  Endocrine: Negative for polydipsia.  Genitourinary: Negative for hematuria.  Skin:       Eczema; aware strong  cream can cause atrophy, stretch marks, skin thinning  Allergic/Immunologic: Negative for food allergies.  Neurological: Negative for tremors.  Hematological: Bruises/bleeds easily (when he uses stronger cream).  Psychiatric/Behavioral: Negative for dysphoric mood.    Objective:   Vitals:   07/05/17 0822  BP: 132/70  Pulse: 83  Resp: 16  Temp: 97.9 F (36.6 C)  TempSrc: Oral  SpO2: 97%  Weight: 211 lb 9.6 oz (96 kg)  Height: 5' 10.03" (1.779 m)   Body mass index is 30.34 kg/m. Wt Readings from Last 3 Encounters:  07/05/17 211 lb 9.6 oz (96 kg)  12/15/16 197 lb (89.4 kg)  10/06/16 201 lb 6 oz (91.3 kg)   Physical Exam  Constitutional: He appears well-developed and well-nourished. No distress.  HENT:  Head: Normocephalic and atraumatic.  Nose: Nose normal.  Mouth/Throat: Oropharynx is clear and moist.  Eyes: EOM are normal. No scleral icterus.  Neck: No JVD present. No thyromegaly present.  Cardiovascular: Normal rate, regular rhythm and normal heart sounds.   Pulmonary/Chest: Effort normal and breath sounds normal. No respiratory distress. He has no wheezes. He has no rales.  Abdominal: Soft. Bowel sounds are normal. He exhibits no distension. There is no tenderness. There is no guarding.  Musculoskeletal: Normal range of motion. He exhibits no edema.  Lymphadenopathy:    He has no cervical adenopathy.  Neurological: He is alert. He displays normal reflexes. He exhibits normal muscle tone. Coordination normal.  Reflex Scores:      Patellar reflexes are 2+ on the right side and 2+ on the left side. Skin: Skin is warm and dry. Rash (eczematous erythematous rash on the right shin) noted. He is not diaphoretic. No cyanosis or erythema. No pallor.  Psychiatric: He has a normal mood and affect. His behavior is normal. Judgment and thought content normal.   Assessment/Plan:   Problem List Items Addressed This Visit      Digestive   Gastroesophageal reflux (Chronic)     Elevate HOB; avoid trigger foods, continue medicine        Musculoskeletal and Integument   Eczema, allergic    Suggested eucrisa, but patient wishes to continue the two steroid creams; discussed the risk of atrophy and stretch marks; continue one stronger and one weaker cream for now; avoid triggers        Other   Social anxiety disorder (Chronic)    Using the beta-blocker      Preventative health care - Primary    USPSTF grade A and B recommendations reviewed with patient; age-appropriate recommendations, preventive care, screening tests, etc discussed and encouraged; healthy living encouraged; see AVS for patient education given to patient  Relevant Orders   CBC with Differential/Platelet   COMPLETE METABOLIC PANEL WITH GFR   Lipid panel   TSH   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 Red Yeast Rice Extract (RED YEAST RICE PO)    Sig: Take 500 mg by mouth daily.  . betamethasone dipropionate (DIPROLENE) 0.05 % cream    Sig: Apply topically 2 (two) times daily. Very sparingly; do not apply to face, underarms, or in groin    Dispense:  30 g    Refill:  5  . triamcinolone cream (KENALOG) 0.1 %    Sig: Apply 1 application topically 2 (two) times daily. If needed (weaker strength)    Dispense:  80 g    Refill:  11   Orders Placed This Encounter  Procedures  . CBC with Differential/Platelet  . COMPLETE METABOLIC PANEL WITH GFR  . Lipid panel  . TSH    Follow up plan: Return in about 1 year (around 07/05/2018) for complete physical.  An After Visit Summary was printed and given to the patient.

## 2017-07-05 NOTE — Assessment & Plan Note (Signed)
Elevate HOB; avoid trigger foods, continue medicine

## 2017-07-05 NOTE — Assessment & Plan Note (Signed)
Suggested eucrisa, but patient wishes to continue the two steroid creams; discussed the risk of atrophy and stretch marks; continue one stronger and one weaker cream for now; avoid triggers

## 2017-07-05 NOTE — Assessment & Plan Note (Signed)
Letter written today for work

## 2017-07-05 NOTE — Assessment & Plan Note (Signed)
USPSTF grade A and B recommendations reviewed with patient; age-appropriate recommendations, preventive care, screening tests, etc discussed and encouraged; healthy living encouraged; see AVS for patient education given to patient  

## 2017-11-14 ENCOUNTER — Encounter: Payer: Self-pay | Admitting: Family Medicine

## 2017-11-14 ENCOUNTER — Ambulatory Visit: Payer: Managed Care, Other (non HMO) | Admitting: Family Medicine

## 2017-11-14 DIAGNOSIS — F4010 Social phobia, unspecified: Secondary | ICD-10-CM | POA: Diagnosis not present

## 2017-11-14 DIAGNOSIS — E669 Obesity, unspecified: Secondary | ICD-10-CM

## 2017-11-14 DIAGNOSIS — L239 Allergic contact dermatitis, unspecified cause: Secondary | ICD-10-CM | POA: Diagnosis not present

## 2017-11-14 DIAGNOSIS — E785 Hyperlipidemia, unspecified: Secondary | ICD-10-CM

## 2017-11-14 DIAGNOSIS — K219 Gastro-esophageal reflux disease without esophagitis: Secondary | ICD-10-CM

## 2017-11-14 DIAGNOSIS — R635 Abnormal weight gain: Secondary | ICD-10-CM

## 2017-11-14 DIAGNOSIS — M26609 Unspecified temporomandibular joint disorder, unspecified side: Secondary | ICD-10-CM | POA: Diagnosis not present

## 2017-11-14 DIAGNOSIS — Z9109 Other allergy status, other than to drugs and biological substances: Secondary | ICD-10-CM

## 2017-11-14 DIAGNOSIS — J309 Allergic rhinitis, unspecified: Secondary | ICD-10-CM | POA: Insufficient documentation

## 2017-11-14 DIAGNOSIS — J301 Allergic rhinitis due to pollen: Secondary | ICD-10-CM

## 2017-11-14 HISTORY — DX: Abnormal weight gain: R63.5

## 2017-11-14 LAB — LIPID PANEL
Cholesterol: 224 mg/dL — ABNORMAL HIGH (ref <200)
HDL: 44 mg/dL (ref >40)
LDL Cholesterol (Calc): 160 mg/dL (calc) — ABNORMAL HIGH
NON-HDL CHOLESTEROL (CALC): 180 mg/dL — AB (ref <130)
TRIGLYCERIDES: 91 mg/dL (ref <150)
Total CHOL/HDL Ratio: 5.1 (calc) — ABNORMAL HIGH (ref <5.0)

## 2017-11-14 LAB — TSH: TSH: 1.63 mIU/L (ref 0.40–4.50)

## 2017-11-14 MED ORDER — SERTRALINE HCL 50 MG PO TABS: 50.0000 mg | ORAL_TABLET | Freq: Every day | ORAL | 11 refills | Status: DC

## 2017-11-14 MED ORDER — CETIRIZINE HCL 10 MG PO TABS: 10.0000 mg | ORAL_TABLET | Freq: Every day | ORAL | 3 refills | Status: DC

## 2017-11-14 MED ORDER — OMEPRAZOLE 20 MG PO CPDR: 20.0000 mg | DELAYED_RELEASE_CAPSULE | Freq: Every day | ORAL | 11 refills | Status: DC

## 2017-11-14 NOTE — Assessment & Plan Note (Signed)
Seeing dentist and wearing night guard

## 2017-11-14 NOTE — Assessment & Plan Note (Signed)
Low dose sertraline

## 2017-11-14 NOTE — Assessment & Plan Note (Signed)
Highly sensitive; will provide letter today to allow for environment free of strong scents and chemicals

## 2017-11-14 NOTE — Progress Notes (Signed)
BP 120/78   Pulse 81   Temp 97.8 F (36.6 C) (Oral)   Resp 16   Wt 225 lb 3.2 oz (102.2 kg)   SpO2 99%   BMI 32.29 kg/m    Subjective:    Patient ID: Joe Patterson, male    DOB: 1976-09-16, 41 y.o.   MRN: [id number]  HPI: Joe Patterson is a 41 y.o. male  Chief Complaint  Patient presents with  . 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; zyrtec is slightly better; no somnolence with zyrtec; allergic to ragweed, pollen in the spring, dust year-round; no pets at home; thinks he is allergic to cats; he is highly sensitive to perfumes and colognes; needs note; perfume causes intense headaches and shutting of eyes if strong enough; needs work environment free of strong perfumed and colognes and strong chemicals and scents  GERD; taking omeprazole; doing okay; silent kind, dentist noticed changes in enamel; elevated HOB; tries to not eat late; loves spicy foods; no blood in the stool  High cholesterol; LDL was 127; previously higher but now on RYR capsules; tries to limit bacon and sausage, but not as good lately  Obesity; has gained weight over the last year; has been up to 230 pounds; no thyroid disease in the family; normal BMs; no fatigue or decreased energy  On zoloft for anxiety and mood; it's doing "okay"; affects his TMJ, worse with higher dose; doing better on lower dose; using propranolol if needed  Eczema, on topical agent, working well; same location  Depression screen [facility] 2/9 11/14/2017 07/05/2017 12/15/2016 10/06/2016 06/22/2016  Decreased Interest 0 0 0 0 0  Down, Depressed, Hopeless 0 0 0 0 0  PHQ - 2 Score 0 0 0 0 0    Relevant past medical, surgical, family and social history reviewed Past Medical History:  Diagnosis Date  . Anxiety   . Eczema   . Esophageal stricture   . GERD (gastroesophageal reflux disease)   . TMJ (dislocation of  temporomandibular joint)    Past Surgical History:  Procedure Laterality Date  . HERNIA REPAIR    . TYMPANOSTOMY TUBE PLACEMENT     as a child   Family History  Problem Relation Age of Onset  . Multiple sclerosis Mother   . Hearing loss Father   . Cancer Maternal Grandmother        lymphoma  . Heart disease Maternal Grandfather        early 74s  . Lung disease Paternal Grandmother   . Blindness Paternal Grandmother   . Parkinson's disease Paternal Grandfather   . COPD Neg Hx   . Diabetes Neg Hx   . Hypertension Neg Hx   . Stroke Neg Hx   . Colon cancer Neg Hx    Social History   Tobacco Use  . Smoking status: Former Smoker    Packs/day: 1.00    Years: 15.00    Pack years: 15.00    Types: Cigarettes    Last attempt to quit: 12/23/2007    Years since quitting: 9.9  . Smokeless tobacco: Former Systems developer    Types: Chew    Quit date: 2009  Substance Use Topics  . Alcohol use: No  . Drug use: No    Interim medical history since last visit reviewed. Allergies and medications reviewed  Review of Systems Per  HPI unless specifically indicated above     Objective:    BP 120/78   Pulse 81   Temp 97.8 F (36.6 C) (Oral)   Resp 16   Wt 225 lb 3.2 oz (102.2 kg)   SpO2 99%   BMI 32.29 kg/m   Wt Readings from Last 3 Encounters:  11/14/17 225 lb 3.2 oz (102.2 kg)  07/05/17 211 lb 9.6 oz (96 kg)  12/15/16 197 lb (89.4 kg)    Physical Exam  Constitutional: He appears well-developed and well-nourished. No distress.  Eyes: EOM are normal. No scleral icterus.  Cardiovascular: Normal rate, regular rhythm and normal heart sounds.  Pulmonary/Chest: Effort normal and breath sounds normal. No respiratory distress. He has no wheezes. He has no rales.  Abdominal: Soft. Bowel sounds are normal. He exhibits no distension. There is no tenderness. There is no guarding.  Musculoskeletal: Normal range of motion. He exhibits no edema.  Neurological: He is alert. He displays normal  reflexes. He exhibits normal muscle tone. Coordination normal.  Skin: Skin is warm and dry. No rash noted. He is not diaphoretic. No pallor.  Psychiatric: He has a normal mood and affect. His behavior is normal. Judgment and thought content normal. His mood appears not anxious. He does not exhibit a depressed mood.    Results for orders placed or performed in visit on 07/05/17  CBC with Differential/Platelet  Result Value Ref Range   WBC 5.6 3.8 - 10.8 K/uL   RBC 4.60 4.20 - 5.80 MIL/uL   Hemoglobin 13.7 13.2 - 17.1 g/dL   HCT 41.4 38.5 - 50.0 %   MCV 90.0 80.0 - 100.0 fL   MCH 29.8 27.0 - 33.0 pg   MCHC 33.1 32.0 - 36.0 g/dL   RDW 13.0 11.0 - 15.0 %   Platelets 260 140 - 400 K/uL   MPV 8.9 7.5 - 12.5 fL   Neutro Abs 3,864 1,500 - 7,800 cells/uL   Lymphs Abs 1,008 850 - 3,900 cells/uL   Monocytes Absolute 616 200 - 950 cells/uL   Eosinophils Absolute 112 15 - 500 cells/uL   Basophils Absolute 0 0 - 200 cells/uL   Neutrophils Relative % 69 %   Lymphocytes Relative 18 %   Monocytes Relative 11 %   Eosinophils Relative 2 %   Basophils Relative 0 %   Smear Review Criteria for review not met   COMPLETE METABOLIC PANEL WITH GFR  Result Value Ref Range   Sodium 140 135 - 146 mmol/L   Potassium 4.0 3.5 - 5.3 mmol/L   Chloride 105 98 - 110 mmol/L   CO2 26 20 - 31 mmol/L   Glucose, Bld 88 65 - 99 mg/dL   BUN 14 7 - 25 mg/dL   Creat 0.87 0.60 - 1.35 mg/dL   Total Bilirubin 0.6 0.2 - 1.2 mg/dL   Alkaline Phosphatase 43 40 - 115 U/L   AST 27 10 - 40 U/L   ALT 42 9 - 46 U/L   Total Protein 6.7 6.1 - 8.1 g/dL   Albumin 4.7 3.6 - 5.1 g/dL   Calcium 9.4 8.6 - 10.3 mg/dL   GFR, Est African American >89 >=60 mL/min   GFR, Est Non African American >89 >=60 mL/min  Lipid panel  Result Value Ref Range   Cholesterol 196 <200 mg/dL   Triglycerides 106 <150 mg/dL   HDL 48 >40 mg/dL   Total CHOL/HDL Ratio 4.1 <5.0 Ratio   VLDL 21 <30 mg/dL   LDL Cholesterol  127 (H) <100 mg/dL  TSH    Result Value Ref Range   TSH 1.75 0.40 - 4.50 mIU/L      Assessment & Plan:   Problem List Items Addressed This Visit      Respiratory   Allergic rhinitis    On medicines        Digestive   Gastroesophageal reflux (Chronic)    Avoid trigger foods when possible; elevate HOB; continue PPI      Relevant Medications   omeprazole (PRILOSEC) 20 MG capsule     Musculoskeletal and Integument   TMJ (temporomandibular joint syndrome)    Seeing dentist and wearing night guard      Eczema, allergic    Continue medicines as already doing        Other   Weight gain    Check TSH one more time; if normal, then likely just diet and inactivity      Relevant Orders   Lipid panel   TSH   Social anxiety disorder (Chronic)    Low dose sertraline      Relevant Medications   sertraline (ZOLOFT) 50 MG tablet   Obesity (BMI 30.0-34.9)    Encouraged weight loss, check thyroid      Borderline hyperlipidemia    Limit saturated fats; check lipids; work on weight loss      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  An after-visit summary was printed and given to the patient at [place].  Please see the patient instructions which may contain other information and recommendations beyond what is mentioned above in the assessment and plan.  Meds ordered this encounter  Medications  . cetirizine (ZYRTEC) 10 MG tablet    Sig: Take 1 tablet (10 mg total) by mouth daily.    Dispense:  90 tablet    Refill:  3  . omeprazole (PRILOSEC) 20 MG capsule    Sig: Take 1 capsule (20 mg total) by mouth daily.    Dispense:  30 capsule    Refill:  11    Okay for either 90 day or 30 day supply, whichever insurance will cover  . sertraline (ZOLOFT) 50 MG tablet    Sig: Take 1 tablet (50 mg  total) by mouth daily.    Dispense:  30 tablet    Refill:  11    Okay for either 90 day or 30 day supply, whichever insurance will cover    Orders Placed This Encounter  Procedures  . Lipid panel  . TSH

## 2017-11-14 NOTE — Assessment & Plan Note (Signed)
Limit saturated fats; check lipids; work on weight loss

## 2017-11-14 NOTE — Assessment & Plan Note (Signed)
Check TSH one more time; if normal, then likely just diet and inactivity

## 2017-11-14 NOTE — Patient Instructions (Addendum)
We'll get labs today Check out the information at familydoctor.org entitled "Nutrition for Weight Loss: What You Need to Know about Fad Diets" Try to lose between 1-2 pounds per week by taking in fewer calories and burning off more calories You can succeed by limiting portions, limiting foods dense in calories and fat, becoming more active, and drinking 8 glasses of water a day (64 ounces) Don't skip meals, especially breakfast, as skipping meals may alter your metabolism Do not use over-the-counter weight loss pills or gimmicks that claim rapid weight loss A healthy BMI (or body mass index) is between 18.5 and 24.9 You can calculate your ideal BMI at the NIH website [employer].huhttp://www.nhlbi.nih.gov/health/educational/lose_wt/BMI/bmicalc.htm If you have not heard anything from my staff in a week about any orders/referrals/studies from today, please contact us here to follow-up [phone]

## 2017-11-14 NOTE — Assessment & Plan Note (Signed)
Avoid trigger foods when possible; elevate HOB; continue PPI

## 2017-11-14 NOTE — Assessment & Plan Note (Signed)
On medicines

## 2017-11-14 NOTE — Assessment & Plan Note (Signed)
Continue medicines as already doing

## 2017-11-14 NOTE — Assessment & Plan Note (Signed)
Encouraged weight loss, check thyroid

## 2017-11-20 ENCOUNTER — Other Ambulatory Visit: Payer: Self-pay | Admitting: Family Medicine

## 2017-11-20 DIAGNOSIS — Z5181 Encounter for therapeutic drug level monitoring: Secondary | ICD-10-CM

## 2017-11-20 DIAGNOSIS — E782 Mixed hyperlipidemia: Secondary | ICD-10-CM

## 2017-11-20 HISTORY — DX: Encounter for therapeutic drug level monitoring: Z51.81

## 2017-11-20 MED ORDER — ATORVASTATIN CALCIUM 20 MG PO TABS: 20.0000 mg | ORAL_TABLET | Freq: Every day | ORAL | 1 refills | Status: DC

## 2018-01-04 ENCOUNTER — Encounter: Payer: Self-pay | Admitting: Family Medicine

## 2018-01-04 ENCOUNTER — Ambulatory Visit: Payer: BC Managed Care – PPO | Admitting: Family Medicine

## 2018-01-04 VITALS — BP 102/64 | HR 111 | Temp 100.2°F | Resp 16 | Wt 226.3 lb

## 2018-01-04 DIAGNOSIS — R112 Nausea with vomiting, unspecified: Secondary | ICD-10-CM

## 2018-01-04 DIAGNOSIS — R52 Pain, unspecified: Secondary | ICD-10-CM

## 2018-01-04 DIAGNOSIS — R509 Fever, unspecified: Secondary | ICD-10-CM | POA: Diagnosis not present

## 2018-01-04 LAB — POCT INFLUENZA A/B
INFLUENZA A, POC: NEGATIVE
Influenza B, POC: NEGATIVE

## 2018-01-04 MED ORDER — OSELTAMIVIR PHOSPHATE 75 MG PO CAPS: 75.0000 mg | ORAL_CAPSULE | Freq: Two times a day (BID) | ORAL | 0 refills | Status: DC

## 2018-01-04 MED ORDER — PROMETHAZINE HCL 25 MG PO TABS: 25.0000 mg | ORAL_TABLET | Freq: Three times a day (TID) | ORAL | 0 refills | Status: DC | PRN

## 2018-01-04 NOTE — Progress Notes (Signed)
BP 102/64   Pulse (!) 111   Temp 100.2 F (37.9 C) (Oral)   Resp 16   Wt 226 lb 4.8 oz (102.6 kg)   SpO2 94%   BMI 32.45 kg/m    Subjective:    Patient ID: Joe Patterson, male    DOB: Jun 28, 1976, 42 y.o.   MRN: [id number]  HPI: Joe Patterson is a 42 y.o. male  Chief Complaint  Patient presents with  . Emesis    all night, with body aches    HPI Patient is here for an acute visit He got sick last night, started vomiting, no blood Having body aches No diarrhea Works in juvenile [employer]; no known outbreak in the facility Fever and chills, got up to 102 degrees; headache; no rash other than normal eczema Taking tylenol No travel Did get flu shot this past season Will pick up some Gatorade and try to drink more  Depression screen [facility] 2/9 01/04/2018 11/14/2017 07/05/2017 12/15/2016 10/06/2016  Decreased Interest 0 0 0 0 0  Down, Depressed, Hopeless 0 0 0 0 0  PHQ - 2 Score 0 0 0 0 0    Relevant past medical, surgical, family and social history reviewed Past Medical History:  Diagnosis Date  . Anxiety   . Eczema   . Esophageal stricture   . GERD (gastroesophageal reflux disease)   . TMJ (dislocation of temporomandibular joint)    Past Surgical History:  Procedure Laterality Date  . HERNIA REPAIR    . TYMPANOSTOMY TUBE PLACEMENT     as a child   Family History  Problem Relation Age of Onset  . Multiple sclerosis Mother   . Hearing loss Father   . Cancer Maternal Grandmother        lymphoma  . Heart disease Maternal Grandfather        early 18s  . Lung disease Paternal Grandmother   . Blindness Paternal Grandmother   . Parkinson's disease Paternal Grandfather   . COPD Neg Hx   . Diabetes Neg Hx   . Hypertension Neg Hx   . Stroke Neg Hx   . Colon cancer Neg Hx    Social History   Tobacco Use  . Smoking status: Former Smoker    Packs/day: 1.00    Years: 15.00    Pack years: 15.00    Types: Cigarettes    Last attempt to quit: 12/23/2007      Years since quitting: 10.0  . Smokeless tobacco: Former Neurosurgeon    Types: Chew    Quit date: 2009  Substance Use Topics  . Alcohol use: No  . Drug use: No    Interim medical history since last visit reviewed. Allergies and medications reviewed  Review of Systems Per HPI unless specifically indicated above     Objective:    BP 102/64   Pulse (!) 111   Temp 100.2 F (37.9 C) (Oral)   Resp 16   Wt 226 lb 4.8 oz (102.6 kg)   SpO2 94%   BMI 32.45 kg/m   Wt Readings from Last 3 Encounters:  01/04/18 226 lb 4.8 oz (102.6 kg)  11/14/17 225 lb 3.2 oz (102.2 kg)  07/05/17 211 lb 9.6 oz (96 kg)    Physical Exam  Constitutional: He appears well-developed and well-nourished. No distress (looks like he doesn't feel well, but no acute sitress, nontoxic).  HENT:  Right Ear: External ear normal.  Left Ear: External ear normal.  Nose: Nose normal.  Mouth/Throat: Oropharynx is clear and moist. No oropharyngeal exudate.  Eyes: Right conjunctiva is injected. Left conjunctiva is injected. No scleral icterus.  Cardiovascular: Regular rhythm. Tachycardia present.  Pulmonary/Chest: Effort normal and breath sounds normal. No accessory muscle usage. No respiratory distress. He has no decreased breath sounds. He has no wheezes. He has no rhonchi.  Lymphadenopathy:    He has no cervical adenopathy.  Neurological: He is alert.  Skin: No rash noted. He is not diaphoretic. No pallor.  Warm to the touch    Results for orders placed or performed in visit on 01/04/18  POCT Influenza A/B  Result Value Ref Range   Influenza A, POC Negative Negative   Influenza B, POC Negative Negative      Assessment & Plan:   Problem List Items Addressed This Visit    None    Visit Diagnoses    Fever, unspecified fever cause    -  Primary   he looks like he has influenza and I will treat him as such; discussed risk of pneumonia, reasons to go to urgent care; rest, hydration, out of work   Relevant Orders    POCT Influenza A/B (Completed)   Nausea and vomiting, intractability of vomiting not specified, unspecified vomiting type       promethazine, gatorade; to urgent care or ER if not able to keep down fluids or stay hydrated   Relevant Orders   POCT Influenza A/B (Completed)   Body aches       he looks like he has the flu   Relevant Orders   POCT Influenza A/B (Completed)       Follow up plan: No Follow-up on file.  An after-visit summary was printed and given to the patient at check-out.  Please see the patient instructions which may contain other information and recommendations beyond what is mentioned above in the assessment and plan.  Meds ordered this encounter  Medications  . promethazine (PHENERGAN) 25 MG tablet    Sig: Take 1 tablet (25 mg total) by mouth every 8 (eight) hours as needed for nausea or vomiting.    Dispense:  20 tablet    Refill:  0  . oseltamivir (TAMIFLU) 75 MG capsule    Sig: Take 1 capsule (75 mg total) by mouth 2 (two) times daily.    Dispense:  10 capsule    Refill:  0    Orders Placed This Encounter  Procedures  . POCT Influenza A/B

## 2018-01-04 NOTE — Patient Instructions (Addendum)
Out work through next Thursday, return on Friday of next week Call us if not ready to go back and we can extend further Start the Tamiflu Use the promethazine if needed for nausea  Influenza, Adult Influenza, more commonly known as "the flu," is a viral infection that primarily affects the respiratory tract. The respiratory tract includes organs that help you breathe, such as the lungs, nose, and throat. The flu causes many common cold symptoms, as well as a high fever and body aches. The flu spreads easily from person to person (is contagious). Getting a flu shot (influenza vaccination) every year is the best way to prevent influenza. What are the causes? Influenza is caused by a virus. You can catch the virus by:  Breathing in droplets from an infected person's cough or sneeze.  Touching something that was recently contaminated with the virus and then touching your mouth, nose, or eyes.  What increases the risk? The following factors may make you more likely to get the flu:  Not cleaning your hands frequently with soap and water or alcohol-based hand sanitizer.  Having close contact with many people during cold and flu season.  Touching your mouth, eyes, or nose without washing or sanitizing your hands first.  Not drinking enough fluids or not eating a healthy diet.  Not getting enough sleep or exercise.  Being under a high amount of stress.  Not getting a yearly (annual) flu shot.  You may be at a higher risk of complications from the flu, such as a severe lung infection (pneumonia), if you:  Are over the age of 79.  Are pregnant.  Have a weakened disease-fighting system (immune system). You may have a weakened immune system if you: ? Have HIV or AIDS. ? Are undergoing chemotherapy. ? Aretaking medicines that reduce the activity of (suppress) the immune system.  Have a long-term (chronic) illness, such as heart disease, kidney disease, diabetes, or lung disease.  Have  a liver disorder.  Are obese.  Have anemia.  What are the signs or symptoms? Symptoms of this condition typically last 4-10 days and may include:  Fever.  Chills.  Headache, body aches, or muscle aches.  Sore throat.  Cough.  Runny or congested nose.  Chest discomfort and cough.  Poor appetite.  Weakness or tiredness (fatigue).  Dizziness.  Nausea or vomiting.  How is this diagnosed? This condition may be diagnosed based on your medical history and a physical exam. Your health care provider may do a nose or throat swab test to confirm the diagnosis. How is this treated? If influenza is detected early, you can be treated with antiviral medicine that can reduce the length of your illness and the severity of your symptoms. This medicine may be given by mouth (orally) or through an IV tube that is inserted in one of your veins. The goal of treatment is to relieve symptoms by taking care of yourself at home. This may include taking over-the-counter medicines, drinking plenty of fluids, and adding humidity to the air in your home. In some cases, influenza goes away on its own. Severe influenza or complications from influenza may be treated in a hospital. Follow these instructions at home:  Take over-the-counter and prescription medicines only as told by your health care provider.  Use a cool mist humidifier to add humidity to the air in your home. This can make breathing easier.  Rest as needed.  Drink enough fluid to keep your urine clear or pale yellow.  Cover your mouth and nose when you cough or sneeze.  Wash your hands with soap and water often, especially after you cough or sneeze. If soap and water are not available, use hand sanitizer.  Stay home from work or school as told by your health care provider. Unless you are visiting your health care provider, try to avoid leaving home until your fever has been gone for 24 hours without the use of medicine.  Keep all  follow-up visits as told by your health care provider. This is important. How is this prevented?  Getting an annual flu shot is the best way to avoid getting the flu. You may get the flu shot in late summer, fall, or winter. Ask your health care provider when you should get your flu shot.  Wash your hands often or use hand sanitizer often.  Avoid contact with people who are sick during cold and flu season.  Eat a healthy diet, drink plenty of fluids, get enough sleep, and exercise regularly. Contact a health care provider if:  You develop new symptoms.  You have: ? Chest pain. ? Diarrhea. ? A fever.  Your cough gets worse.  You produce more mucus.  You feel nauseous or you vomit. Get help right away if:  You develop shortness of breath or difficulty breathing.  Your skin or nails turn a bluish color.  You have severe pain or stiffness in your neck.  You develop a sudden headache or sudden pain in your face or ear.  You cannot stop vomiting. This information is not intended to replace advice given to you by your health care provider. Make sure you discuss any questions you have with your health care provider. Document Released: 11/25/2000 Document Revised: 05/05/2016 Document Reviewed: 09/22/2015 Elsevier Interactive Patient Education  2017 [employer]

## 2018-01-05 ENCOUNTER — Ambulatory Visit: Payer: Managed Care, Other (non HMO) | Admitting: Family Medicine

## 2018-01-11 ENCOUNTER — Other Ambulatory Visit: Payer: Self-pay

## 2018-01-11 ENCOUNTER — Other Ambulatory Visit: Payer: Self-pay | Admitting: Family Medicine

## 2018-01-11 DIAGNOSIS — E782 Mixed hyperlipidemia: Secondary | ICD-10-CM

## 2018-01-11 DIAGNOSIS — Z5181 Encounter for therapeutic drug level monitoring: Secondary | ICD-10-CM

## 2018-01-11 NOTE — Telephone Encounter (Signed)
Please let pt know that we'll see what his lipid panel shows before refilling the medicine, in case the dose needs to be changed There are orders already in the system He should not run out of medicine before the labs are back Thank you

## 2018-01-11 NOTE — Telephone Encounter (Signed)
Left detailed refill

## 2018-01-15 ENCOUNTER — Other Ambulatory Visit: Payer: Self-pay | Admitting: Family Medicine

## 2018-01-19 ENCOUNTER — Other Ambulatory Visit: Payer: Self-pay

## 2018-01-19 DIAGNOSIS — E782 Mixed hyperlipidemia: Secondary | ICD-10-CM

## 2018-01-19 MED ORDER — ATORVASTATIN CALCIUM 20 MG PO TABS: 20.0000 mg | ORAL_TABLET | Freq: Every day | ORAL | 0 refills | Status: DC

## 2018-01-19 NOTE — Telephone Encounter (Signed)
Incoming request for atorvastatin from pt's pharmacy. Called pt left message informing him of the need to come and have labs done. Please advise on refill. Thanks!

## 2018-01-19 NOTE — Telephone Encounter (Signed)
Thank you I've sent in two week supply, but need to see labs to see if dose goes up or down

## 2018-01-23 LAB — LIPID PANEL
CHOL/HDL RATIO: 3 (calc) (ref <5.0)
CHOLESTEROL: 111 mg/dL (ref <200)
HDL: 37 mg/dL — AB (ref >40)
LDL CHOLESTEROL (CALC): 59 mg/dL
Non-HDL Cholesterol (Calc): 74 mg/dL (calc) (ref <130)
Triglycerides: 71 mg/dL (ref <150)

## 2018-01-23 LAB — ALT: ALT: 25 U/L (ref 9–46)

## 2018-01-24 ENCOUNTER — Other Ambulatory Visit: Payer: Self-pay | Admitting: Family Medicine

## 2018-01-24 DIAGNOSIS — E782 Mixed hyperlipidemia: Secondary | ICD-10-CM

## 2018-01-24 MED ORDER — ATORVASTATIN CALCIUM 20 MG PO TABS: 20.0000 mg | ORAL_TABLET | Freq: Every day | ORAL | 11 refills | Status: DC

## 2018-01-24 NOTE — Progress Notes (Signed)
Refills of statin sent 

## 2018-02-24 ENCOUNTER — Other Ambulatory Visit: Payer: Self-pay | Admitting: Family Medicine

## 2018-04-02 ENCOUNTER — Other Ambulatory Visit: Payer: Self-pay | Admitting: Family Medicine

## 2018-04-03 ENCOUNTER — Other Ambulatory Visit: Payer: Self-pay | Admitting: Family Medicine

## 2018-05-08 ENCOUNTER — Ambulatory Visit: Payer: BC Managed Care – PPO | Admitting: Nurse Practitioner

## 2018-05-08 ENCOUNTER — Encounter: Payer: Self-pay | Admitting: Nurse Practitioner

## 2018-05-08 ENCOUNTER — Encounter: Payer: Self-pay | Admitting: Emergency Medicine

## 2018-05-08 VITALS — BP 120/82 | HR 73 | Temp 98.1°F | Resp 18 | Ht 70.0 in | Wt 231.1 lb

## 2018-05-08 DIAGNOSIS — E782 Mixed hyperlipidemia: Secondary | ICD-10-CM

## 2018-05-08 DIAGNOSIS — J301 Allergic rhinitis due to pollen: Secondary | ICD-10-CM

## 2018-05-08 DIAGNOSIS — B001 Herpesviral vesicular dermatitis: Secondary | ICD-10-CM | POA: Insufficient documentation

## 2018-05-08 DIAGNOSIS — K2100 Gastro-esophageal reflux disease with esophagitis, without bleeding: Secondary | ICD-10-CM

## 2018-05-08 DIAGNOSIS — L239 Allergic contact dermatitis, unspecified cause: Secondary | ICD-10-CM | POA: Diagnosis not present

## 2018-05-08 MED ORDER — OMEPRAZOLE 20 MG PO CPDR: 20.0000 mg | DELAYED_RELEASE_CAPSULE | Freq: Every day | ORAL | 5 refills | Status: DC

## 2018-05-08 MED ORDER — TRIAMCINOLONE ACETONIDE 0.1 % EX CREA: 1.0000 "application " | TOPICAL_CREAM | Freq: Two times a day (BID) | CUTANEOUS | 3 refills | Status: DC

## 2018-05-08 MED ORDER — BETAMETHASONE DIPROPIONATE 0.05 % EX CREA: TOPICAL_CREAM | Freq: Two times a day (BID) | CUTANEOUS | 0 refills | Status: DC

## 2018-05-08 MED ORDER — MONTELUKAST SODIUM 10 MG PO TABS: 10.0000 mg | ORAL_TABLET | Freq: Every day | ORAL | 5 refills | Status: DC

## 2018-05-08 MED ORDER — ATORVASTATIN CALCIUM 20 MG PO TABS: 20.0000 mg | ORAL_TABLET | Freq: Every day | ORAL | 5 refills | Status: DC

## 2018-05-08 MED ORDER — OMEPRAZOLE 40 MG PO CPDR
40.0000 mg | DELAYED_RELEASE_CAPSULE | Freq: Every day | ORAL | 5 refills | Status: DC
Start: 2018-05-08 — End: 2018-11-03

## 2018-05-08 MED ORDER — VALACYCLOVIR HCL 500 MG PO TABS: 500.0000 mg | ORAL_TABLET | Freq: Two times a day (BID) | ORAL | 3 refills | Status: DC

## 2018-05-08 NOTE — Progress Notes (Addendum)
Name: Joe Patterson   MRN: [id number]    DOB: [birth year]   Date:05/08/2018       Progress Note  Subjective  Chief Complaint  Chief Complaint  Patient presents with  . Allergic Rhinitis   . Blister  . Medication Refill    HPI Fever blisters Patient states has outbreaks maybe once a year was on medication in the past but doesn't recall name has been over a years since received new script. Last took in December. Patient notes has been having sneezing, and headaches relieved with tylenol, feels like allergies are getting worse with dry heat. Notes he usually get blisters when he gets ill and noticed on pop up last night midline above upper lip  Endorses dry cough, mild shob when outside- grass and pollen was getting to him. Denies fever, malaise, myalgia   GERD: takes Prilosec every night, dentist informed him acid is eating away at his molars- had esophageal strictures in the past, states is silent is asymptomatic for him   Cholesterol: takes lipitor every night denies myaglias  Zoloft: works well  has TMJ can make it worse but benefits outweigh pain for him.   Depression screen [facility] 2/9 05/08/2018 01/04/2018 11/14/2017  Decreased Interest 0 0 0  Down, Depressed, Hopeless 0 0 0  PHQ - 2 Score 0 0 0  Altered sleeping 0 - -  Tired, decreased energy 0 - -  Change in appetite 1 - -  Feeling bad or failure about yourself  0 - -  Trouble concentrating 0 - -  Moving slowly or fidgety/restless 0 - -  Suicidal thoughts 0 - -  PHQ-9 Score 1 - -  Difficult doing work/chores Not difficult at all - -   GAD 7 : Generalized Anxiety Score 05/08/2018  Nervous, Anxious, on Edge 0  Control/stop worrying 0  Worry too much - different things 0  Trouble relaxing 0  Restless 0  Easily annoyed or irritable 0  Afraid - awful might happen 0  Total GAD 7 Score 0  Anxiety Difficulty Not difficult at all      Patient Active Problem List   Diagnosis Date Noted  . Fever blister 05/08/2018  .  Medication monitoring encounter 11/20/2017  . Allergic rhinitis 11/14/2017  . Weight gain 11/14/2017  . Hyperlipidemia 11/14/2017  . Obesity (BMI 30.0-34.9) 11/14/2017  . Preventative health care 07/05/2017  . TMJ (temporomandibular joint syndrome) 06/22/2016  . Shoulder pain, bilateral 06/22/2016  . Cyst of skin and subcutaneous tissue 06/22/2016  . Social anxiety disorder 12/29/2015  . Allergy to perfume 12/29/2015  . History of esophageal stricture 12/29/2015  . Eczema, allergic 12/29/2015  . Gastroesophageal reflux 12/29/2015  . 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 Anxiety   . Eczema   . Esophageal stricture   . GERD (gastroesophageal reflux disease)   . TMJ (dislocation of temporomandibular joint)     Past Surgical History:  Procedure Laterality Date  . HERNIA REPAIR    . TYMPANOSTOMY TUBE PLACEMENT     as a child    Social History   Tobacco Use  . Smoking status: Former Smoker    Packs/day: 1.00    Years: 15.00    Pack years: 15.00    Types: Cigarettes    Last attempt to quit: 12/23/2007    Years since quitting: 10.3  . Smokeless tobacco: Former Neurosurgeon    Types: Chew    Quit date: 2009  Substance Use Topics  . Alcohol use: No     Current Outpatient Medications:  .  atorvastatin (LIPITOR) 20 MG tablet, Take 1 tablet (20 mg total) by mouth at bedtime., Disp: 30 tablet, Rfl: 5 .  betamethasone dipropionate (DIPROLENE) 0.05 % cream, Apply topically 2 (two) times daily. Use SPARINGLY; can cause stretch marks, Disp: 45 g, Rfl: 0 .  cetirizine (ZYRTEC) 10 MG tablet, Take 1 tablet (10 mg total) by mouth daily., Disp: 90 tablet, Rfl: 3 .  omeprazole (PRILOSEC) 40 MG capsule, Take 1 capsule (40 mg total) by mouth daily., Disp: 30 capsule, Rfl: 5 .  sertraline (ZOLOFT) 50 MG tablet, Take 1 tablet (50 mg total) by mouth daily., Disp: 30 tablet, Rfl: 11 .  triamcinolone cream (KENALOG) 0.1 %, Apply 1 application topically  2 (two) times daily. If needed (weaker strength), Disp: 453.6 g, Rfl: 3 .  montelukast (SINGULAIR) 10 MG tablet, Take 1 tablet (10 mg total) by mouth at bedtime., Disp: 30 tablet, Rfl: 5 .  valACYclovir (VALTREX) 500 MG tablet, Take 1 tablet (500 mg total) by mouth 2 (two) times daily. Take twice a day for 3 days within 24 hours of symptom onset, Disp: 6 tablet, Rfl: 3  No Known Allergies  ROS  Constitutional: Negative for fever or weight change.  Respiratory: Positive for cough and shortness of breath.   Cardiovascular: Negative for chest pain or palpitations.  Gastrointestinal: Negative for abdominal pain, no bowel changes.  Musculoskeletal: Negative for gait problem or joint swelling.  Skin: Negative for rash.  Neurological: Negative for dizziness Postive for headache relieved by tylenol.  No other specific complaints in a complete review of systems (except as listed in HPI above).  Objective  Vitals:   05/08/18 1024  BP: 120/82  Pulse: 73  Resp: 18  Temp: 98.1 F (36.7 C)  TempSrc: Oral  SpO2: 96%  Weight: 231 lb 1.6 oz (104.8 kg)  Height:  (1.778 m)   Body mass index is 33.16 kg/m.  Nursing Note and Vital Signs reviewed.  Physical Exam  Skin:        Constitutional: Patient appears well-developed and well-nourished.  No distress.  HEENT: head atraumatic, normocephalic, pupils equal and reactive to light, TM's without erythema or bulging, bilateral TM scarring noted,  no maxillary or frontal sinus tenderness , neck supple without lymphadenopathy, oropharynx pink and moist without exudate, no nasal discharge Cardiovascular: Normal rate, regular rhythm, S1/S2 present.  No murmur or rub heard.  Pulmonary/Chest: Effort normal and breath sounds clear. No respiratory distress or retractions. Abdominal: Soft and non-tender, bowel sounds present  Psychiatric: Patient has a normal mood and affect. behavior is normal. Judgment and thought content normal.  No results  found for this or any previous visit (from the past 72 hour(s)).  Assessment & Plan 1. Fever blister - valACYclovir (VALTREX) 500 MG tablet; Take 1 tablet (500 mg total) by mouth 2 (two) times daily. Take twice a day for 3 days within 24 hours of symptom onset  Dispense: 6 tablet; Refill: 3  2. Seasonal allergic rhinitis due to pollen - montelukast (SINGULAIR) 10 MG tablet; Take 1 tablet (10 mg total) by mouth at bedtime.  Dispense: 30 tablet; Refill: 5  3. Mixed hyperlipidemia - atorvastatin (LIPITOR) 20 MG tablet; Take 1 tablet (20 mg total) by mouth at bedtime.  Dispense: 30 tablet; Refill: 5  4. Gastroesophageal reflux disease with esophagitis - increased dose, given information on non-pharm tactics to reduce reflux, will refer to  GI if unimproved - omeprazole (PRILOSEC) 40 MG capsule; Take 1 capsule (40 mg total) by mouth daily.  Dispense: 30 capsule; Refill: 5  5. Eczema, allergic - betamethasone dipropionate (DIPROLENE) 0.05 % cream; Apply topically 2 (two) times daily. Use SPARINGLY; can cause stretch marks  Dispense: 45 g; Refill: 0 - triamcinolone cream (KENALOG) 0.1 %; Apply 1 application topically 2 (two) times daily. If needed (weaker strength)  Dispense: 453.6 g; Refill: 3    -Red flags and when to present for emergency care or RTC including fever >101.44F, chest pain, shortness of breath, new/worsening/un-resolving symptoms,  reviewed with patient at time of visit. Follow up and care instructions discussed and provided in AVS. -Reviewed Health Maintenance:   -------------------------------------------- I have reviewed this encounter including the documentation in this note and/or discussed this patient with the provider, Sharyon Cable DNP AGNP-C. I am certifying that I agree with the content of this note as supervising physician. Baruch Gouty, MD [facility] 05/08/2018, 11:48 AM

## 2018-05-08 NOTE — Patient Instructions (Signed)
Food Choices for Gastroesophageal Reflux Disease, Adult When you have gastroesophageal reflux disease (GERD), the foods you eat and your eating habits are very important. Choosing the right foods can help ease your discomfort. What guidelines do I need to follow?  Choose fruits, vegetables, whole grains, and low-fat dairy products.  Choose low-fat meat, fish, and poultry.  Limit fats such as oils, salad dressings, butter, nuts, and avocado.  Keep a food diary. This helps you identify foods that cause symptoms.  Avoid foods that cause symptoms. These may be different for everyone.  Eat small meals often instead of 3 large meals a day.  Eat your meals slowly, in a place where you are relaxed.  Limit fried foods.  Cook foods using methods other than frying.  Avoid drinking alcohol.  Avoid drinking large amounts of liquids with your meals.  Avoid bending over or lying down until 2-3 hours after eating. What foods are not recommended? These are some foods and drinks that may make your symptoms worse: Vegetables  Tomatoes. Tomato juice. Tomato and spaghetti sauce. Chili peppers. Onion and garlic. Horseradish. Fruits  Oranges, grapefruit, and lemon (fruit and juice). Meats  High-fat meats, fish, and poultry. This includes hot dogs, ribs, ham, sausage, salami, and bacon. Dairy  Whole milk and chocolate milk. Sour cream. Cream. Butter. Ice cream. Cream cheese. Drinks  Coffee and tea. Bubbly (carbonated) drinks or energy drinks. Condiments  Hot sauce. Barbecue sauce. Sweets/Desserts  Chocolate and cocoa. Donuts. Peppermint and spearmint. Fats and Oils  High-fat foods. This includes French fries and potato chips. Other  Vinegar. Strong spices. This includes black pepper, white pepper, red pepper, cayenne, curry powder, cloves, ginger, and chili powder. The items listed above may not be a complete list of foods and drinks to avoid. Contact your dietitian for more information.    This information is not intended to replace advice given to you by your health care provider. Make sure you discuss any questions you have with your health care provider. Document Released: 05/29/2012 Document Revised: 05/05/2016 Document Reviewed: 10/02/2013 Elsevier Interactive Patient Education  2017 [employer]  

## 2018-05-15 ENCOUNTER — Ambulatory Visit: Payer: BC Managed Care – PPO | Admitting: Family Medicine

## 2018-06-02 ENCOUNTER — Other Ambulatory Visit: Payer: Self-pay | Admitting: Nurse Practitioner

## 2018-06-02 DIAGNOSIS — L239 Allergic contact dermatitis, unspecified cause: Secondary | ICD-10-CM

## 2018-08-05 ENCOUNTER — Other Ambulatory Visit: Payer: Self-pay | Admitting: Family Medicine

## 2018-08-05 DIAGNOSIS — L239 Allergic contact dermatitis, unspecified cause: Secondary | ICD-10-CM

## 2018-08-06 ENCOUNTER — Other Ambulatory Visit: Payer: Self-pay | Admitting: Nurse Practitioner

## 2018-11-03 ENCOUNTER — Other Ambulatory Visit: Payer: Self-pay | Admitting: Nurse Practitioner

## 2018-11-03 DIAGNOSIS — J301 Allergic rhinitis due to pollen: Secondary | ICD-10-CM

## 2018-11-03 DIAGNOSIS — K2100 Gastro-esophageal reflux disease with esophagitis, without bleeding: Secondary | ICD-10-CM

## 2018-11-12 ENCOUNTER — Ambulatory Visit: Payer: BC Managed Care – PPO | Admitting: Family Medicine

## 2018-11-21 ENCOUNTER — Ambulatory Visit: Payer: BC Managed Care – PPO | Admitting: Family Medicine

## 2018-11-21 ENCOUNTER — Encounter: Payer: Self-pay | Admitting: Family Medicine

## 2018-11-21 ENCOUNTER — Other Ambulatory Visit: Payer: Self-pay

## 2018-11-21 VITALS — BP 120/66 | HR 74 | Temp 98.0°F | Ht 70.0 in | Wt 246.2 lb

## 2018-11-21 DIAGNOSIS — F4010 Social phobia, unspecified: Secondary | ICD-10-CM

## 2018-11-21 DIAGNOSIS — Z0000 Encounter for general adult medical examination without abnormal findings: Secondary | ICD-10-CM

## 2018-11-21 DIAGNOSIS — J301 Allergic rhinitis due to pollen: Secondary | ICD-10-CM

## 2018-11-21 DIAGNOSIS — B001 Herpesviral vesicular dermatitis: Secondary | ICD-10-CM

## 2018-11-21 DIAGNOSIS — L239 Allergic contact dermatitis, unspecified cause: Secondary | ICD-10-CM | POA: Diagnosis not present

## 2018-11-21 DIAGNOSIS — K219 Gastro-esophageal reflux disease without esophagitis: Secondary | ICD-10-CM

## 2018-11-21 DIAGNOSIS — R635 Abnormal weight gain: Secondary | ICD-10-CM

## 2018-11-21 DIAGNOSIS — Z5181 Encounter for therapeutic drug level monitoring: Secondary | ICD-10-CM

## 2018-11-21 DIAGNOSIS — K2100 Gastro-esophageal reflux disease with esophagitis, without bleeding: Secondary | ICD-10-CM

## 2018-11-21 DIAGNOSIS — E782 Mixed hyperlipidemia: Secondary | ICD-10-CM

## 2018-11-21 DIAGNOSIS — Z8719 Personal history of other diseases of the digestive system: Secondary | ICD-10-CM

## 2018-11-21 HISTORY — DX: Morbid (severe) obesity due to excess calories: E66.01

## 2018-11-21 MED ORDER — TRIAMCINOLONE ACETONIDE 0.1 % EX CREA: 1.0000 "application " | TOPICAL_CREAM | Freq: Two times a day (BID) | CUTANEOUS | 3 refills | Status: DC

## 2018-11-21 MED ORDER — SERTRALINE HCL 50 MG PO TABS: 50.0000 mg | ORAL_TABLET | Freq: Every day | ORAL | 11 refills | Status: DC

## 2018-11-21 MED ORDER — BETAMETHASONE DIPROPIONATE 0.05 % EX CREA: TOPICAL_CREAM | Freq: Two times a day (BID) | CUTANEOUS | 1 refills | Status: DC

## 2018-11-21 MED ORDER — OMEPRAZOLE 40 MG PO CPDR: 40.0000 mg | DELAYED_RELEASE_CAPSULE | Freq: Two times a day (BID) | ORAL | 0 refills | Status: DC

## 2018-11-21 MED ORDER — MONTELUKAST SODIUM 10 MG PO TABS: ORAL_TABLET | ORAL | 11 refills | Status: DC

## 2018-11-21 MED ORDER — ATORVASTATIN CALCIUM 20 MG PO TABS: 20.0000 mg | ORAL_TABLET | Freq: Every day | ORAL | 5 refills | Status: DC

## 2018-11-21 MED ORDER — VALACYCLOVIR HCL 500 MG PO TABS: 500.0000 mg | ORAL_TABLET | Freq: Two times a day (BID) | ORAL | 3 refills | Status: DC

## 2018-11-21 NOTE — Assessment & Plan Note (Signed)
Labs drawn today; he'll return for CPE in 4 weeks

## 2018-11-21 NOTE — Progress Notes (Signed)
BP 120/66   Pulse 74   Temp 98 F (36.7 C)   Ht 5\' 10"  (1.778 m)   Wt 246 lb 3.2 oz (111.7 kg)   SpO2 98%   BMI 35.33 kg/m    Subjective:    Patient ID: Joe Patterson, male    DOB: 11/01/1976, 42 y.o.   MRN: [id number]  HPI: Joe Patterson is a 42 y.o. male  Chief Complaint  Patient presents with  . Follow-up    HPI Here for medicine refills  High cholesterol; on atorvastatin; no new aches or pain; he has had aches and pains before he started taking the medicine; different times in his hands and back and shoulders; arthritis runs in the family; he is eating way too much of everything and will change after the New Year; he has been going to the gym; gaining weight; he does not think it's his thyroid; working 3rd shift  Eczema; he uses two different creams; strong cream on the back that will really itch bad; he uses it on the elbows; he uses the milder cream on the areas where people can't see; no stretch  Allergic rhinitis; using singulair and zyrtec, getting the OTC antihistamine  Patient says he is getting worsening GERD symptoms on the 40 mg of omeprazole; he had an esophageal stricture; he will get acid regurgitation all the way up into the mouth; the dentist says it is terrible on his teeth; he is going to need crowns; using 2 inch elevation board under the head of the bed  He is doing well with sertraline; more for just anxiety; wishes to keep dose where it is  Depression screen [facility] 2/9 11/21/2018 05/08/2018 01/04/2018 11/14/2017 07/05/2017  Decreased Interest 0 0 0 0 0  Down, Depressed, Hopeless 0 0 0 0 0  PHQ - 2 Score 0 0 0 0 0  Altered sleeping 0 0 - - -  Tired, decreased energy 0 0 - - -  Change in appetite 0 1 - - -  Feeling bad or failure about yourself  0 0 - - -  Trouble concentrating 0 0 - - -  Moving slowly or fidgety/restless 0 0 - - -  Suicidal thoughts 0 0 - - -  PHQ-9 Score 0 1 - - -  Difficult doing work/chores Not difficult at all Not difficult  at all - - -   Fall Risk  11/21/2018 01/04/2018 11/14/2017 07/05/2017 12/15/2016  Falls in the past year? 0 No No No No    Relevant past medical, surgical, family and social history reviewed Past Medical History:  Diagnosis Date  . Anxiety   . Eczema   . Esophageal stricture   . GERD (gastroesophageal reflux disease)   . TMJ (dislocation of temporomandibular joint)    Past Surgical History:  Procedure Laterality Date  . HERNIA REPAIR    . TYMPANOSTOMY TUBE PLACEMENT     as a child   Family History  Problem Relation Age of Onset  . Multiple sclerosis Mother   . Hearing loss Father   . Cancer Maternal Grandmother        lymphoma  . Heart disease Maternal Grandfather        early 64s  . Lung disease Paternal Grandmother   . Blindness Paternal Grandmother   . Parkinson's disease Paternal Grandfather   . COPD Neg Hx   . Diabetes Neg Hx   . Hypertension Neg Hx   . Stroke Neg Hx   .  Colon cancer Neg Hx    Social History   Tobacco Use  . Smoking status: Former Smoker    Packs/day: 1.00    Years: 15.00    Pack years: 15.00    Types: Cigarettes    Last attempt to quit: 12/23/2007    Years since quitting: 10.9  . Smokeless tobacco: Former Neurosurgeon    Types: Chew    Quit date: 2009  Substance Use Topics  . Alcohol use: No  . Drug use: No     Office Visit from 11/21/2018 in [facility]  AUDIT-C Score  0      Interim medical history since last visit reviewed. Allergies and medications reviewed  Review of Systems  Constitutional: Positive for unexpected weight change (he is just eating a lot; says it is not his thyroid; he's just eating).  Gastrointestinal: Negative for abdominal pain and blood in stool.   Per HPI unless specifically indicated above     Objective:    BP 120/66   Pulse 74   Temp 98 F (36.7 C)   Ht 5\' 10"  (1.778 m)   Wt 246 lb 3.2 oz (111.7 kg)   SpO2 98%   BMI 35.33 kg/m   Wt Readings from Last 3 Encounters:  11/21/18 246  lb 3.2 oz (111.7 kg)  05/08/18 231 lb 1.6 oz (104.8 kg)  01/04/18 226 lb 4.8 oz (102.6 kg)    Physical Exam  Constitutional: He appears well-developed and well-nourished. No distress.  HENT:  Head: Normocephalic and atraumatic.  Eyes: EOM are normal. No scleral icterus.  Neck: No thyromegaly present.  Cardiovascular: Normal rate and regular rhythm.  Pulmonary/Chest: Effort normal and breath sounds normal.  Abdominal: Soft. Bowel sounds are normal. He exhibits no distension.  Musculoskeletal: He exhibits no edema.  Neurological: Coordination normal.  Skin: Skin is warm and dry. No pallor.  Psychiatric: He has a normal mood and affect. His behavior is normal. Judgment and thought content normal.    Results for orders placed or performed in visit on 01/11/18  ALT  Result Value Ref Range   ALT 25 9 - 46 U/L  Lipid panel  Result Value Ref Range   Cholesterol 111 <200 mg/dL   HDL 37 (L) >16 mg/dL   Triglycerides 71 <109 mg/dL   LDL Cholesterol (Calc) 59 mg/dL (calc)   Total CHOL/HDL Ratio 3.0 <5.0 (calc)   Non-HDL Cholesterol (Calc) 74 <604 mg/dL (calc)      Assessment & Plan:   Problem List Items Addressed This Visit      Respiratory   Allergic rhinitis    Continue singulair and zyrtec      Relevant Medications   montelukast (SINGULAIR) 10 MG tablet     Digestive   Gastroesophageal reflux - Primary (Chronic)    Worsening symptoms on 40 mg of omeprazole with reports of dental erosion; refer to GI for consideration of EGD; discussed possibility of Barrett's, effects of acid on esophagus; elevate HOB; discussed risks of PPI use long-term; he'll try to lose weight in the new year      Relevant Medications   omeprazole (PRILOSEC) 40 MG capsule   Other Relevant Orders   Ambulatory referral to Gastroenterology   Fever blister    infrequent; refills of valacyclovir provided      Relevant Medications   valACYclovir (VALTREX) 500 MG tablet     Musculoskeletal and  Integument   Eczema, allergic    Refilled the two creams that he uses;  stronger one for worse areas, milder one for others; continue singulair      Relevant Medications   betamethasone dipropionate (DIPROLENE) 0.05 % cream   triamcinolone cream (KENALOG) 0.1 %     Other   Weight gain    Patient does not want me to check his thyroid; he says this is all his own eating      Social anxiety disorder (Chronic)    Continue sertraline at current dose      Relevant Medications   sertraline (ZOLOFT) 50 MG tablet   Preventative health care    Labs drawn today; he'll return for CPE in 4 weeks      Relevant Orders   CBC with Differential/Platelet   COMPLETE METABOLIC PANEL WITH GFR   Lipid panel   Morbid obesity (HCC)    Patient did not want me to check his thyroid function; he reports poor eating, explaining how much food there is at this time of year, "people give you candy"; he will try to work on this in the new year but says "there's no sense" in trying to work on it now; he will go to the gym, has a plan for working on weight loss in January; I pointed out that he is putting himself at risk for diabetes, etc with excessive eating and weight gain; labs drawn today were not fasting as he says he had a "big breakfast"      Medication monitoring encounter    Check liver and kidney function today      Hyperlipidemia    Reviewed last two lipid panels; continue statin; he is unfortunately eating very poorly and gaining weight per his own report; will check today; he will try to do better in the new year      Relevant Medications   atorvastatin (LIPITOR) 20 MG tablet   History of esophageal stricture    Refer to GI          Follow up plan: Return in about 4 weeks (around 12/19/2018) for complete physical.  An after-visit summary was printed and given to the patient at check-out.  Please see the patient instructions which may contain other information and recommendations beyond  what is mentioned above in the assessment and plan.  Meds ordered this encounter  Medications  . montelukast (SINGULAIR) 10 MG tablet    Sig: TAKE 1 TABLET BY MOUTH EVERYDAY AT BEDTIME    Dispense:  30 tablet    Refill:  11  . atorvastatin (LIPITOR) 20 MG tablet    Sig: Take 1 tablet (20 mg total) by mouth at bedtime.    Dispense:  30 tablet    Refill:  5  . betamethasone dipropionate (DIPROLENE) 0.05 % cream    Sig: Apply topically 2 (two) times daily. Use SPARINGLY; can cause stretch marks    Dispense:  45 g    Refill:  1  . triamcinolone cream (KENALOG) 0.1 %    Sig: Apply 1 application topically 2 (two) times daily. If needed (weaker strength)    Dispense:  453.6 g    Refill:  3  . omeprazole (PRILOSEC) 40 MG capsule    Sig: Take 1 capsule (40 mg total) by mouth 2 (two) times daily.    Dispense:  60 capsule    Refill:  0  . sertraline (ZOLOFT) 50 MG tablet    Sig: Take 1 tablet (50 mg total) by mouth daily.    Dispense:  30 tablet    Refill:  11    Okay for either 90 day or 30 day supply, whichever insurance will cover  . valACYclovir (VALTREX) 500 MG tablet    Sig: Take 1 tablet (500 mg total) by mouth 2 (two) times daily. Take twice a day for 3 days within 24 hours of symptom onset    Dispense:  6 tablet    Refill:  3    Orders Placed This Encounter  Procedures  . CBC with Differential/Platelet  . COMPLETE METABOLIC PANEL WITH GFR  . Lipid panel  . Ambulatory referral to Gastroenterology

## 2018-11-21 NOTE — Assessment & Plan Note (Signed)
Patient did not want me to check his thyroid function; he reports poor eating, explaining how much food there is at this time of year, "people give you candy"; he will try to work on this in the new year but says "there's no sense" in trying to work on it now; he will go to the gym, has a plan for working on weight loss in January; I pointed out that he is putting himself at risk for diabetes, etc with excessive eating and weight gain; labs drawn today were not fasting as he says he had a "big breakfast"

## 2018-11-21 NOTE — Assessment & Plan Note (Signed)
Reviewed last two lipid panels; continue statin; he is unfortunately eating very poorly and gaining weight per his own report; will check today; he will try to do better in the new year

## 2018-11-21 NOTE — Assessment & Plan Note (Signed)
Refilled the two creams that he uses; stronger one for worse areas, milder one for others; continue singulair

## 2018-11-21 NOTE — Assessment & Plan Note (Signed)
Refer to GI 

## 2018-11-21 NOTE — Patient Instructions (Addendum)
Try turmeric as a natural anti-inflammatory (for pain and arthritis). It comes in capsules where you buy aspirin and fish oil, but also as a spice where you buy pepper and garlic powder.  Caution: prolonged use of proton pump inhibitors like omeprazole (Prilosec), pantoprazole (Protonix), esomeprazole (Nexium), and others like Dexilant and Aciphex may increase your risk of pneumonia, Clostridium difficile colitis, osteoporosis, anemia and other health complications Try to limit or avoid triggers like coffee, caffeinated beverages, onions, chocolate, spicy foods, peppermint, acidic foods like pizza, spaghetti sauce, and orange juice Lose weight if you are overweight or obese Try elevating the head of your bed by placing a small wedge between your mattress and box springs to keep acid in the stomach at night instead of coming up into your esophagus  We'll have you see the gastroenterologist for consideration of an EGD  Check out the information at familydoctor.org entitled "Nutrition for Weight Loss: What You Need to Know about Fad Diets" Try to lose between 1-2 pounds per week by taking in fewer calories and burning off more calories You can succeed by limiting portions, limiting foods dense in calories and fat, becoming more active, and drinking 8 glasses of water a day (64 ounces) Don't skip meals, especially breakfast, as skipping meals may alter your metabolism Do not use over-the-counter weight loss pills or gimmicks that claim rapid weight loss A healthy BMI (or body mass index) is between 18.5 and 24.9 You can calculate your ideal BMI at the NIH website JobEconomics.hu  Preventing Unhealthy Weight Gain, Adult Staying at a healthy weight is important. When fat builds up in your body, you may become overweight or obese. These conditions put you at greater risk for developing certain health problems, such as heart disease, diabetes, sleeping  problems, joint problems, and some cancers. Unhealthy weight gain is often the result of making unhealthy choices in what you eat. It is also a result of not getting enough exercise. You can make changes to your lifestyle to prevent obesity and stay as healthy as possible. What nutrition changes can be made? To maintain a healthy weight and prevent obesity:  Eat only as much as your body needs. To do this: ? Pay attention to signs that you are hungry or full. Stop eating as soon as you feel full. ? If you feel hungry, try drinking water first. Drink enough water so your urine is clear or pale yellow. ? Eat smaller portions. ? Look at serving sizes on food labels. Most foods contain more than one serving per container. ? Eat the recommended amount of calories for your gender and activity level. While most active people should eat around 2,000 calories per day, if you are trying to lose weight or are not very active, you main need to eat less calories. Talk to your health care provider or dietitian about how many calories you should eat each day.  Choose healthy foods, such as: ? Fruits and vegetables. Try to fill at least half of your plate at each meal with fruits and vegetables. ? Whole grains, such as whole wheat bread, brown rice, and quinoa. ? Lean meats, such as chicken or fish. ? Other healthy proteins, such as beans, eggs, or tofu. ? Healthy fats, such as nuts, seeds, fatty fish, and olive oil. ? Low-fat or fat-free dairy.  Check food labels and avoid food and drinks that: ? Are high in calories. ? Have added sugar. ? Are high in sodium. ? Have saturated fats or trans  fats.  Limit how much you eat of the following foods: ? Prepackaged meals. ? Fast food. ? Fried foods. ? Processed meat, such as bacon, sausage, and deli meats. ? Fatty cuts of red meat and poultry with skin.  Cook foods in healthier ways, such as by baking, broiling, or grilling.  When grocery shopping, try to  shop around the outside of the store. This helps you buy mostly fresh foods and avoid canned and prepackaged foods.  What lifestyle changes can be made?  Exercise at least 30 minutes 5 or more days each week. Exercising includes brisk walking, yard work, biking, running, swimming, and team sports like basketball and soccer. Ask your health care provider which exercises are safe for you.  Do not use any products that contain nicotine or tobacco, such as cigarettes and e-cigarettes. If you need help quitting, ask your health care provider.  Limit alcohol intake to no more than 1 drink a day for nonpregnant women and 2 drinks a day for men. One drink equals 12 oz of beer, 5 oz of wine, or 1 oz of hard liquor.  Try to get 7-9 hours of sleep each night. What other changes can be made?  Keep a food and activity journal to keep track of: ? What you ate and how many calories you had. Remember to count sauces, dressings, and side dishes. ? Whether you were active, and what exercises you did. ? Your calorie, weight, and activity goals.  Check your weight regularly. Track any changes. If you notice you have gained weight, make changes to your diet or activity routine.  Avoid taking weight-loss medicines or supplements. Talk to your health care provider before starting any new medicine or supplement.  Talk to your health care provider before trying any new diet or exercise plan. Why are these changes important? Eating healthy, staying active, and having healthy habits not only help prevent obesity, they also:  Help you to manage stress and emotions.  Help you to connect with friends and family.  Improve your self-esteem.  Improve your sleep.  Prevent long-term health problems.  What can happen if changes are not made? Being obese or overweight can cause you to develop joint or bone problems, which can make it hard for you to stay active or do activities you enjoy. Being obese or overweight  also puts stress on your heart and lungs and can lead to health problems like diabetes, heart disease, and some cancers. Where to find more information: Talk with your health care provider or a dietitian about healthy eating and healthy lifestyle choices. You may also find other information through these resources:  U.S. Department of Agriculture MyPlate: https://ball-collins.biz/www.choosemyplate.gov  American Heart Association: www.heart.org  Centers for Disease Control and Prevention: FootballExhibition.com.brwww.cdc.gov  Summary  Staying at a healthy weight is important. It helps prevent certain diseases and health problems, such as heart disease, diabetes, joint problems, sleep disorders, and some cancers.  Being obese or overweight can cause you to develop joint or bone problems, which can make it hard for you to stay active or do activities you enjoy.  You can prevent unhealthy weight gain by eating a healthy diet, exercising regularly, not smoking, limiting alcohol, and getting enough sleep.  Talk with your health care provider or a dietitian for guidance about healthy eating and healthy lifestyle choices. This information is not intended to replace advice given to you by your health care provider. Make sure you discuss any questions you have with your  health care provider. Document Released: 11/29/2016 Document Revised: 01/04/2017 Document Reviewed: 01/04/2017 Elsevier Interactive Patient Education  2018 [employer].  Obesity, Adult Obesity is the condition of having too much total body fat. Being overweight or obese means that your weight is greater than what is considered healthy for your body size. Obesity is determined by a measurement called BMI. BMI is an estimate of body fat and is calculated from height and weight. For adults, a BMI of 30 or higher is considered obese. Obesity can eventually lead to other health concerns and major illnesses, including:  Stroke.  Coronary artery disease (CAD).  Type 2  diabetes.  Some types of cancer, including cancers of the colon, breast, uterus, and gallbladder.  Osteoarthritis.  High blood pressure (hypertension).  High cholesterol.  Sleep apnea.  Gallbladder stones.  Infertility problems.  What are the causes? The main cause of obesity is taking in (consuming) more calories than your body uses for energy. Other factors that contribute to this condition may include:  Being born with genes that make you more likely to become obese.  Having a medical condition that causes obesity. These conditions include: ? Hypothyroidism. ? Polycystic ovarian syndrome (PCOS). ? Binge-eating disorder. ? Cushing syndrome.  Taking certain medicines, such as steroids, antidepressants, and seizure medicines.  Not being physically active (sedentary lifestyle).  Living where there are limited places to exercise safely or buy healthy foods.  Not getting enough sleep.  What increases the risk? The following factors may increase your risk of this condition:  Having a family history of obesity.  Being a woman of African-American descent.  Being a man of Hispanic descent.  What are the signs or symptoms? Having excessive body fat is the main symptom of this condition. How is this diagnosed? This condition may be diagnosed based on:  Your symptoms.  Your medical history.  A physical exam. Your health care provider may measure: ? Your BMI. If you are an adult with a BMI between 25 and less than 30, you are considered overweight. If you are an adult with a BMI of 30 or higher, you are considered obese. ? The distances around your hips and your waist (circumferences). These may be compared to each other to help diagnose your condition. ? Your skinfold thickness. Your health care provider may gently pinch a fold of your skin and measure it.  How is this treated? Treatment for this condition often includes changing your lifestyle. Treatment may include  some or all of the following:  Dietary changes. Work with your health care provider and a dietitian to set a weight-loss goal that is healthy and reasonable for you. Dietary changes may include eating: ? Smaller portions. A portion size is the amount of a particular food that is healthy for you to eat at one time. This varies from person to person. ? Low-calorie or low-fat options. ? More whole grains, fruits, and vegetables.  Regular physical activity. This may include aerobic activity (cardio) and strength training.  Medicine to help you lose weight. Your health care provider may prescribe medicine if you are unable to lose 1 pound a week after 6 weeks of eating more healthily and doing more physical activity.  Surgery. Surgical options may include gastric banding and gastric bypass. Surgery may be done if: ? Other treatments have not helped to improve your condition. ? You have a BMI of 40 or higher. ? You have life-threatening health problems related to obesity.  Follow these instructions at  home:  Eating and drinking   Follow recommendations from your health care provider about what you eat and drink. Your health care provider may advise you to: ? Limit fast foods, sweets, and processed snack foods. ? Choose low-fat options, such as low-fat milk instead of whole milk. ? Eat 5 or more servings of fruits or vegetables every day. ? Eat at home more often. This gives you more control over what you eat. ? Choose healthy foods when you eat out. ? Learn what a healthy portion size is. ? Keep low-fat snacks on hand. ? Avoid sugary drinks, such as soda, fruit juice, iced tea sweetened with sugar, and flavored milk. ? Eat a healthy breakfast.  Drink enough water to keep your urine clear or pale yellow.  Do not go without eating for long periods of time (do not fast) or follow a fad diet. Fasting and fad diets can be unhealthy and even dangerous. Physical Activity  Exercise regularly,  as told by your health care provider. Ask your health care provider what types of exercise are safe for you and how often you should exercise.  Warm up and stretch before being active.  Cool down and stretch after being active.  Rest between periods of activity. Lifestyle  Limit the time that you spend in front of your TV, computer, or video game system.  Find ways to reward yourself that do not involve food.  Limit alcohol intake to no more than 1 drink a day for nonpregnant women and 2 drinks a day for men. One drink equals 12 oz of beer, 5 oz of wine, or 1 oz of hard liquor. General instructions  Keep a weight loss journal to keep track of the food you eat and how much you exercise you get.  Take over-the-counter and prescription medicines only as told by your health care provider.  Take vitamins and supplements only as told by your health care provider.  Consider joining a support group. Your health care provider may be able to recommend a support group.  Keep all follow-up visits as told by your health care provider. This is important. Contact a health care provider if:  You are unable to meet your weight loss goal after 6 weeks of dietary and lifestyle changes. This information is not intended to replace advice given to you by your health care provider. Make sure you discuss any questions you have with your health care provider. Document Released: 01/05/2005 Document Revised: 05/02/2016 Document Reviewed: 09/16/2015 Elsevier Interactive Patient Education  2018 [employer].

## 2018-11-21 NOTE — Assessment & Plan Note (Signed)
Worsening symptoms on 40 mg of omeprazole with reports of dental erosion; refer to GI for consideration of EGD; discussed possibility of Barrett's, effects of acid on esophagus; elevate HOB; discussed risks of PPI use long-term; he'll try to lose weight in the new year

## 2018-11-21 NOTE — Progress Notes (Signed)
Greetings. It was a pleasure to see you today. Your complete blood count is normal. You have normal numbers of white blood cells, red blood cells, and platelets. The other labs are pending. Peace, Dr. Lada

## 2018-11-21 NOTE — Assessment & Plan Note (Signed)
infrequent; refills of valacyclovir provided

## 2018-11-21 NOTE — Assessment & Plan Note (Signed)
Continue sertraline at current dose  

## 2018-11-21 NOTE — Assessment & Plan Note (Signed)
Patient does not want me to check his thyroid; he says this is all his own eating

## 2018-11-21 NOTE — Assessment & Plan Note (Signed)
Continue singulair and zyrtec.  

## 2018-11-21 NOTE — Assessment & Plan Note (Signed)
Check liver and kidney function today 

## 2018-11-22 LAB — COMPLETE METABOLIC PANEL WITH GFR
AG Ratio: 2 (calc) (ref 1.0–2.5)
ALT: 31 U/L (ref 9–46)
AST: 25 U/L (ref 10–40)
Albumin: 4.6 g/dL (ref 3.6–5.1)
Alkaline phosphatase (APISO): 51 U/L (ref 40–115)
BUN: 10 mg/dL (ref 7–25)
CO2: 29 mmol/L (ref 20–32)
Calcium: 9.9 mg/dL (ref 8.6–10.3)
Chloride: 103 mmol/L (ref 98–110)
Creat: 0.97 mg/dL (ref 0.60–1.35)
GFR, Est African American: 112 mL/min/{1.73_m2} (ref >=60)
GFR, Est Non African American: 97 mL/min/{1.73_m2} (ref >=60)
Globulin: 2.3 g/dL (calc) (ref 1.9–3.7)
Glucose, Bld: 133 mg/dL (ref 65–139)
Potassium: 3.9 mmol/L (ref 3.5–5.3)
Sodium: 140 mmol/L (ref 135–146)
Total Bilirubin: 0.3 mg/dL (ref 0.2–1.2)
Total Protein: 6.9 g/dL (ref 6.1–8.1)

## 2018-11-22 LAB — CBC WITH DIFFERENTIAL/PLATELET
BASOS PCT: 0.4 %
Basophils Absolute: 28 cells/uL (ref 0–200)
Eosinophils Absolute: 170 cells/uL (ref 15–500)
Eosinophils Relative: 2.4 %
HCT: 39.1 % (ref 38.5–50.0)
Hemoglobin: 13.3 g/dL (ref 13.2–17.1)
Lymphs Abs: 1633 cells/uL (ref 850–3900)
MCH: 29.5 pg (ref 27.0–33.0)
MCHC: 34 g/dL (ref 32.0–36.0)
MCV: 86.7 fL (ref 80.0–100.0)
MPV: 10.4 fL (ref 7.5–12.5)
Monocytes Relative: 8 %
NEUTROS PCT: 66.2 %
Neutro Abs: 4700 cells/uL (ref 1500–7800)
Platelets: 301 10*3/uL (ref 140–400)
RBC: 4.51 10*6/uL (ref 4.20–5.80)
RDW: 12.3 % (ref 11.0–15.0)
Total Lymphocyte: 23 %
WBC mixed population: 568 cells/uL (ref 200–950)
WBC: 7.1 10*3/uL (ref 3.8–10.8)

## 2018-11-22 LAB — LIPID PANEL
Cholesterol: 148 mg/dL (ref <200)
HDL: 44 mg/dL (ref >40)
LDL Cholesterol (Calc): 71 mg/dL (calc)
Non-HDL Cholesterol (Calc): 104 mg/dL (calc) (ref <130)
Total CHOL/HDL Ratio: 3.4 (calc) (ref <5.0)
Triglycerides: 253 mg/dL — ABNORMAL HIGH (ref <150)

## 2018-11-26 ENCOUNTER — Encounter: Payer: Self-pay | Admitting: Family Medicine

## 2018-11-26 DIAGNOSIS — E781 Pure hyperglyceridemia: Secondary | ICD-10-CM | POA: Insufficient documentation

## 2018-11-26 DIAGNOSIS — R7309 Other abnormal glucose: Secondary | ICD-10-CM | POA: Insufficient documentation

## 2018-11-27 ENCOUNTER — Other Ambulatory Visit: Payer: Self-pay | Admitting: Family Medicine

## 2018-12-12 HISTORY — PX: APPENDECTOMY: SHX54

## 2018-12-29 ENCOUNTER — Other Ambulatory Visit: Payer: Self-pay | Admitting: Family Medicine

## 2018-12-29 DIAGNOSIS — K2100 Gastro-esophageal reflux disease with esophagitis, without bleeding: Secondary | ICD-10-CM

## 2018-12-31 NOTE — Telephone Encounter (Signed)
I referred patient to GI over a month ago He should be getting in to see them soon I will provide just one more refill of this and we'll ask him to see specialist within 30 days

## 2018-12-31 NOTE — Telephone Encounter (Signed)
Left detailed voicemail

## 2019-01-11 ENCOUNTER — Encounter: Payer: BC Managed Care – PPO | Admitting: Family Medicine

## 2019-01-16 ENCOUNTER — Encounter: Payer: Self-pay | Admitting: Gastroenterology

## 2019-01-16 ENCOUNTER — Ambulatory Visit: Payer: BC Managed Care – PPO | Admitting: Gastroenterology

## 2019-01-16 ENCOUNTER — Other Ambulatory Visit: Payer: Self-pay

## 2019-01-16 VITALS — BP 123/85 | HR 78 | Ht 70.0 in | Wt 234.8 lb

## 2019-01-16 DIAGNOSIS — K219 Gastro-esophageal reflux disease without esophagitis: Secondary | ICD-10-CM | POA: Diagnosis not present

## 2019-01-16 MED ORDER — OMEPRAZOLE 20 MG PO CPDR: 20.0000 mg | DELAYED_RELEASE_CAPSULE | Freq: Two times a day (BID) | ORAL | 3 refills | Status: DC

## 2019-01-16 NOTE — Progress Notes (Signed)
Wyline Mood MD, MRCP(U.K) [address]  [address]  [place], [place] [address]  Main: [phone]  Fax: [phone]   Gastroenterology Consultation  Referring Provider:     Kerman Passey, MD Primary Care Physician:  Kerman Passey, MD Primary Gastroenterologist:  Dr. Wyline Mood  Reason for Consultation:     GERD        HPI:   Joe Patterson is a 43 y.o. y/o male referred for consultation & management  by Dr. Sherie Don, Janit Bern, MD.    He has been referred for GERD. 11/21/2018 : Hb 13.3 grams    Reflux:  Onset : > 10 years under control Symptoms: 8-9 years back- had an esophageal stricture with dysphagia and was evaluated , had an EGD with dilation and since then no issues with dysphagia, Recently having acid in his throat and his dentist told him having effects on his teeth,  Recent weight gain: has intentionally lost some weight and then gained- stable  Medications: Prilosec 40 mg BID- before meals  Narcotics or anticholinergics use : no  Dinner time : bed time at 10 am , has dinner at 6.30 am in between is not laying flat   Prior EGD: yes Family history of esophageal cancer:no  Not a smoker- ex smoker quit 12 years     Past Medical History:  Diagnosis Date  . Anxiety   . Eczema   . Esophageal stricture   . GERD (gastroesophageal reflux disease)   . TMJ (dislocation of temporomandibular joint)     Past Surgical History:  Procedure Laterality Date  . HERNIA REPAIR    . TYMPANOSTOMY TUBE PLACEMENT     as a child    Prior to Admission medications   Medication Sig Start Date End Date Taking? Authorizing Provider  atorvastatin (LIPITOR) 20 MG tablet Take 1 tablet (20 mg total) by mouth at bedtime. 11/21/18  Yes Lada, Janit Bern, MD  betamethasone dipropionate (DIPROLENE) 0.05 % cream Apply topically 2 (two) times daily. Use SPARINGLY; can cause stretch marks 11/21/18  Yes Lada, Janit Bern, MD  cetirizine (ZYRTEC) 10 MG tablet Take 1 tablet (10 mg total) by  mouth daily. 11/14/17  Yes Lada, Janit Bern, MD  montelukast (SINGULAIR) 10 MG tablet TAKE 1 TABLET BY MOUTH EVERYDAY AT BEDTIME 11/21/18  Yes Lada, Janit Bern, MD  omeprazole (PRILOSEC) 40 MG capsule TAKE 1 CAPSULE BY MOUTH TWICE A DAY 12/31/18  Yes Lada, Janit Bern, MD  sertraline (ZOLOFT) 50 MG tablet Take 1 tablet (50 mg total) by mouth daily. 11/21/18  Yes Lada, Janit Bern, MD  triamcinolone cream (KENALOG) 0.1 % Apply 1 application topically 2 (two) times daily. If needed (weaker strength) 11/21/18  Yes Lada, Janit Bern, MD  valACYclovir (VALTREX) 500 MG tablet Take 1 tablet (500 mg total) by mouth 2 (two) times daily. Take twice a day for 3 days within 24 hours of symptom onset 11/21/18  Yes Lada, Janit Bern, MD    Family History  Problem Relation Age of Onset  . Multiple sclerosis Mother   . Hearing loss Father   . Cancer Maternal Grandmother        lymphoma  . Heart disease Maternal Grandfather        early 40s  . Lung disease Paternal Grandmother   . Blindness Paternal Grandmother   . Parkinson's disease Paternal Grandfather   . COPD Neg Hx   . Diabetes Neg Hx   . Hypertension Neg Hx   .  Stroke Neg Hx   . Colon cancer Neg Hx      Social History   Tobacco Use  . Smoking status: Former Smoker    Packs/day: 1.00    Years: 15.00    Pack years: 15.00    Types: Cigarettes    Last attempt to quit: 12/23/2007    Years since quitting: 11.0  . Smokeless tobacco: Former NeurosurgeonUser    Types: Chew    Quit date: 2009  Substance Use Topics  . Alcohol use: No  . Drug use: No    Allergies as of 01/16/2019  . (No Known Allergies)    Review of Systems:    All systems reviewed and negative except where noted in HPI.   Physical Exam:  BP 123/85   Pulse 78   Ht 5\' 10"  (1.778 m)   Wt 234 lb 12.8 oz (106.5 kg)   BMI 33.69 kg/m  No LMP for male patient. Psych:  Alert and cooperative. Normal mood and affect. General:   Alert,  Well-developed, well-nourished, pleasant and cooperative in  NAD Head:  Normocephalic and atraumatic. Eyes:  Sclera clear, no icterus.   Conjunctiva pink. Ears:  Normal auditory acuity. Nose:  No deformity, discharge, or lesions. Mouth:  No deformity or lesions,oropharynx pink & moist. Neck:  Supple; no masses or thyromegaly. Lungs:  Respirations even and unlabored.  Clear throughout to auscultation.   No wheezes, crackles, or rhonchi. No acute distress. Heart:  Regular rate and rhythm; no murmurs, clicks, rubs, or gallops. Abdomen:  Normal bowel sounds.  No bruits.  Soft, non-tender and non-distended without masses, hepatosplenomegaly or hernias noted.  No guarding or rebound tenderness.    Msk:  Symmetrical without gross deformities. Good, equal movement & strength bilaterally. Pulses:  Normal pulses noted. Extremities:  No clubbing or edema.  No cyanosis. Neurologic:  Alert and oriented x3;  grossly normal neurologically. Skin:  Intact without significant lesions or rashes. No jaundice. Lymph Nodes:  No significant cervical adenopathy. Psych:  Alert and cooperative. Normal mood and affect.  Imaging Studies: No results found.  Assessment and Plan:   Joe Patterson is a 43 y.o. y/o male has been referred for GERD.  GERD : Counseled on life style changes, suggest to use PPI first thing in the morning on empty stomach and eat 30 minutes after. Advised on the use of a wedge pillow at night , avoid meals for 2 hours prior to bed time. Weight loss .Discussed the risks and benefits of long term PPI use including but not limited to bone loss, chronic kidney disease, infections , low magnesium . Aim to use at the lowest dose for the shortest period of time . Will try to decrease Prilosec to 20 mg BID,screen for Barrettes , discussed briefly about considering surgical options for GERD due to his young age.    Follow up in 8 weeks   Dr Wyline MoodKiran Jhovani Griswold MD,MRCP(U.K)

## 2019-01-16 NOTE — Patient Instructions (Signed)

## 2019-01-25 ENCOUNTER — Other Ambulatory Visit: Payer: Self-pay | Admitting: Family Medicine

## 2019-01-25 DIAGNOSIS — K2100 Gastro-esophageal reflux disease with esophagitis, without bleeding: Secondary | ICD-10-CM

## 2019-01-28 NOTE — Telephone Encounter (Signed)
Called pt regarding omeprazole refill  Unable to contact LVM to return call

## 2019-01-28 NOTE — Telephone Encounter (Signed)
Joe Patterson can you check what dose he has been on and if on 40 mg BID , if he is willing to go down to once daily

## 2019-02-05 ENCOUNTER — Ambulatory Visit (INDEPENDENT_AMBULATORY_CARE_PROVIDER_SITE_OTHER): Payer: BC Managed Care – PPO | Admitting: Family Medicine

## 2019-02-05 ENCOUNTER — Encounter: Payer: Self-pay | Admitting: Family Medicine

## 2019-02-05 VITALS — BP 122/86 | HR 77 | Temp 97.9°F | Resp 12 | Ht 70.0 in | Wt 233.3 lb

## 2019-02-05 DIAGNOSIS — Z114 Encounter for screening for human immunodeficiency virus [HIV]: Secondary | ICD-10-CM | POA: Diagnosis not present

## 2019-02-05 DIAGNOSIS — R7309 Other abnormal glucose: Secondary | ICD-10-CM | POA: Diagnosis not present

## 2019-02-05 DIAGNOSIS — Z0000 Encounter for general adult medical examination without abnormal findings: Secondary | ICD-10-CM

## 2019-02-05 NOTE — Assessment & Plan Note (Signed)
Check fasting glucose (truly fasting) and A1c

## 2019-02-05 NOTE — Progress Notes (Signed)
BP 122/86   Pulse 77   Temp 97.9 F (36.6 C) (Oral)   Resp 12   Ht 5\' 10"  (1.778 m)   Wt 233 lb 4.8 oz (105.8 kg)   SpO2 95%   BMI 33.48 kg/m    Subjective:    Patient ID: Joe Patterson, male    DOB: 08/04/1976, 43 y.o.   MRN: [id number]  HPI: Joe RumpsScott W Shall is a 43 y.o. male  Chief Complaint  Patient presents with  . Annual Exam    HPI USPSTF grade A and B recommendations Depression:  Dep[facility] screen [facility] 2/9 02/05/2019 11/21/2018 05/08/2018 01/04/2018 11/14/2017  Decreased Interest 0 0 0 0 0  Down, Depressed, Hopeless 0 0 0 0 0  PHQ - 2 Score 0 0 0 0 0  Altered sleeping 0 0 0 - -  Tired, decreased energy 0 0 0 - -  Change in appetite 0 0 1 - -  Feeling bad or failure about yourself  0 0 0 - -  Trouble concentrating 0 0 0 - -  Moving slowly or fidgety/restless 0 0 0 - -  Suicidal thoughts 0 0 0 - -  PHQ-9 Score 0 0 1 - -  Difficult doing work/chores - Not difficult at all Not difficult at all - -   Hypertension: BP Readings from Last 3 Encounters:  02/05/19 122/86  01/16/19 123/85  11/21/18 120/66   Obesity: gave up sugar, ice cream New Year's resolution  Wt Readings from Last 3 Encounters:  02/05/19 233 lb 4.8 oz (105.8 kg)  01/16/19 234 lb 12.8 oz (106.5 kg)  11/21/18 246 lb 3.2 oz (111.7 kg)   BMI Readings from Last 3 Encounters:  02/05/19 33.48 kg/m  01/16/19 33.69 kg/m  11/21/18 35.33 kg/m    Immunizations: flu shot UTD/ tetanus UTD  Skin cancer: seeing dermatologist; no worrisome moles Lung cancer:  nonsmoker Prostate cancer: no fam hx; no problem urinating No results found for: PSA Colorectal cancer: no fam hx; start at age 43 AAA: n/a Aspirin: n/a Diet: trying to eat better, cut out sugar and still big on white bread Exercise: doing regular exercise a few times a week, elliptical Alcohol:    [facility]  AUDIT-C Score  0     Tobacco use: nonsmoker HIV, hep B, 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        Non-fasting reference interval .   07/05/2017 88 65 - 99 mg/dL Final  78/29/562107/11/2016 86 65 - 99 mg/dL Final   Lipids:  Lab Results  Component Value Date   CHOL 148 11/21/2018   CHOL 111 01/23/2018   CHOL 224 (H) 11/14/2017   Lab Results  Component Value Date   HDL 44 11/21/2018   HDL 37 (L) 01/23/2018   HDL 44 11/14/2017   Lab Results  Component Value Date   LDLCALC 71 11/21/2018   LDLCALC 59 01/23/2018   LDLCALC 160 (H) 11/14/2017   Lab Results  Component Value Date   TRIG 253 (H) 11/21/2018   TRIG 71 01/23/2018   TRIG 91 11/14/2017   Lab Results  Component Value Date   CHOLHDL 3.4 11/21/2018   CHOLHDL 3.0 01/23/2018   CHOLHDL 5.1 (H) 11/14/2017   No results found for:  LDLDIRECT   Depression screen [facility] 2/9 02/05/2019 11/21/2018 05/08/2018 01/04/2018 11/14/2017  Decreased Interest 0 0 0 0 0  Down, Depressed, Hopeless 0 0 0 0 0  PHQ - 2 Score 0 0 0 0 0  Altered sleeping 0 0 0 - -  Tired, decreased energy 0 0 0 - -  Change in appetite 0 0 1 - -  Feeling bad or failure about yourself  0 0 0 - -  Trouble concentrating 0 0 0 - -  Moving slowly or fidgety/restless 0 0 0 - -  Suicidal thoughts 0 0 0 - -  PHQ-9 Score 0 0 1 - -  Difficult doing work/chores - Not difficult at all Not difficult at all - -   Fall Risk  02/05/2019 11/21/2018 01/04/2018 11/14/2017 07/05/2017  Falls in the past year? 0 0 No No No  Number falls in past yr: 0 - - - -  Injury with Fall? 0 - - - -  Follow up Falls evaluation completed - - - -    Relevant past medical, surgical, family and social history reviewed Past Medical History:  Diagnosis Date  . Anxiety   . Eczema   . Esophageal stricture   . GERD (gastroesophageal reflux disease)   . TMJ (dislocation of temporomandibular joint)    Past Surgical History:    Procedure Laterality Date  . HERNIA REPAIR    . TYMPANOSTOMY TUBE PLACEMENT     as a child   Family History  Problem Relation Age of Onset  . Multiple sclerosis Mother   . Hearing loss Father   . Cancer Maternal Grandmother        lymphoma  . Heart disease Maternal Grandfather        early 50s  . Lung disease Paternal Grandmother   . Blindness Paternal Grandmother   . Parkinson's disease Paternal Grandfather   . COPD Neg Hx   . Diabetes Neg Hx   . Hypertension Neg Hx   . Stroke Neg Hx   . Colon cancer Neg Hx    Social History   Tobacco Use  . Smoking status: Former Smoker    Packs/day: 1.00    Years: 15.00    Pack years: 15.00    Types: Cigarettes    Last attempt to quit: 12/23/2007    Years since quitting: 11.1  . Smokeless tobacco: Former Neurosurgeon    Types: Chew    Quit date: 2009  Substance Use Topics  . Alcohol use: No  . Drug use: No     Office Visit from 02/05/2019 in [facility]  AUDIT-C Score  0      Interim medical history since last visit reviewed. Allergies and medications reviewed  Review of Systems  Constitutional: Negative for unexpected weight change.  HENT: Positive for hearing loss (for a long time, no worse; offered referral to ENT, declined by patient).   Eyes: Negative for visual disturbance.       Wears glasses  Respiratory: Negative for shortness of breath and wheezing.   Cardiovascular: Negative for chest pain.  Gastrointestinal: Negative for blood in stool.  Endocrine: Negative for polydipsia.  Genitourinary: Negative for hematuria.  Skin:       Nothing worrisome  Allergic/Immunologic: Negative for food allergies.  Neurological: Negative for tremors.  Hematological: Negative for adenopathy. Does not bruise/bleed easily.  Psychiatric/Behavioral: Negative for dysphoric mood.   Per HPI unless specifically indicated above     Objective:    BP  122/86   Pulse 77   Temp 97.9 F (36.6 C) (Oral)   Resp 12   Ht 5'  10" (1.778 m)   Wt 233 lb 4.8 oz (105.8 kg)   SpO2 95%   BMI 33.48 kg/m   Wt Readings from Last 3 Encounters:  02/05/19 233 lb 4.8 oz (105.8 kg)  01/16/19 234 lb 12.8 oz (106.5 kg)  11/21/18 246 lb 3.2 oz (111.7 kg)    Physical Exam Constitutional:      General: He is not in acute distress.    Appearance: He is well-developed. He is not diaphoretic.  HENT:     Head: Normocephalic and atraumatic.     Right Ear: Tympanic membrane and ear canal normal.     Left Ear: Tympanic membrane and ear canal normal.     Nose: Nose normal.  Eyes:     General: No scleral icterus. Neck:     Thyroid: No thyromegaly.     Vascular: No JVD.  Cardiovascular:     Rate and Rhythm: Normal rate and regular rhythm.     Heart sounds: Normal heart sounds.  Pulmonary:     Effort: Pulmonary effort is normal. No respiratory distress.     Breath sounds: Normal breath sounds. No wheezing or rales.  Abdominal:     General: Bowel sounds are normal. There is no distension.     Palpations: Abdomen is soft.     Tenderness: There is no abdominal tenderness. There is no guarding.  Musculoskeletal: Normal range of motion.  Lymphadenopathy:     Cervical: No cervical adenopathy.  Skin:    General: Skin is warm and dry.     Coloration: Skin is not pale.     Findings: No erythema or rash.  Neurological:     Mental Status: He is alert.     Motor: No abnormal muscle tone.     Coordination: Coordination normal.     Deep Tendon Reflexes: Reflexes normal.  Psychiatric:        Behavior: Behavior normal.        Thought Content: Thought content normal.        Judgment: Judgment normal.     Results for orders placed or performed in visit on 11/21/18  CBC with Differential/Platelet  Result Value Ref Range   WBC 7.1 3.8 - 10.8 Thousand/uL   RBC 4.51 4.20 - 5.80 Million/uL   Hemoglobin 13.3 13.2 - 17.1 g/dL   HCT 37.9 02.4 - 09.7 %   MCV 86.7 80.0 - 100.0 fL   MCH 29.5 27.0 - 33.0 pg   MCHC 34.0 32.0 - 36.0 g/dL    RDW 35.3 29.9 - 24.2 %   Platelets 301 140 - 400 Thousand/uL   MPV 10.4 7.5 - 12.5 fL   Neutro Abs 4,700 1,500 - 7,800 cells/uL   Lymphs Abs 1,633 850 - 3,900 cells/uL   WBC mixed population 568 200 - 950 cells/uL   Eosinophils Absolute 170 15 - 500 cells/uL   Basophils Absolute 28 0 - 200 cells/uL   Neutrophils Relative % 66.2 %   Total Lymphocyte 23.0 %   Monocytes Relative 8.0 %   Eosinophils Relative 2.4 %   Basophils Relative 0.4 %  COMPLETE METABOLIC PANEL WITH GFR  Result Value Ref Range   Glucose, Bld 133 65 - 139 mg/dL   BUN 10 7 - 25 mg/dL   Creat 6.83 4.19 - 6.22 mg/dL   GFR, Est Non African American 97 > OR =  60 mL/min/1.29m2   GFR, Est African American 112 > OR = 60 mL/min/1.16m2   BUN/Creatinine Ratio NOT APPLICABLE 6 - 22 (calc)   Sodium 140 135 - 146 mmol/L   Potassium 3.9 3.5 - 5.3 mmol/L   Chloride 103 98 - 110 mmol/L   CO2 29 20 - 32 mmol/L   Calcium 9.9 8.6 - 10.3 mg/dL   Total Protein 6.9 6.1 - 8.1 g/dL   Albumin 4.6 3.6 - 5.1 g/dL   Globulin 2.3 1.9 - 3.7 g/dL (calc)   AG Ratio 2.0 1.0 - 2.5 (calc)   Total Bilirubin 0.3 0.2 - 1.2 mg/dL   Alkaline phosphatase (APISO) 51 40 - 115 U/L   AST 25 10 - 40 U/L   ALT 31 9 - 46 U/L  Lipid panel  Result Value Ref Range   Cholesterol 148 <200 mg/dL   HDL 44 >66 mg/dL   Triglycerides 440 (H) <150 mg/dL   LDL Cholesterol (Calc) 71 mg/dL (calc)   Total CHOL/HDL Ratio 3.4 <5.0 (calc)   Non-HDL Cholesterol (Calc) 104 <130 mg/dL (calc)      Assessment & Plan:   Problem List Items Addressed This Visit      Other   Elevated glucose    Check fasting glucose (truly fasting) and A1c      Relevant Orders   Hemoglobin A1c    Other Visit Diagnoses    Routine general medical examination at a health care facility    -  Primary   USPSTF grade A and B reviewed, healthy living encouraged   Relevant Orders   CBC   Lipid panel   Comprehensive metabolic panel   TSH   Encounter for screening for HIV       check  today   Relevant Orders   HIV Antibody (routine testing w rflx)       Follow up plan: Return in 1 year (on 02/06/2020) for complete physical.  An after-visit summary was printed and given to the patient at check-out.  Please see the patient instructions which may contain other information and recommendations beyond what is mentioned above in the assessment and plan.  No orders of the defined types were placed in this encounter.   Orders Placed This Encounter  Procedures  . CBC  . Lipid panel  . Comprehensive metabolic panel  . TSH  . HIV Antibody (routine testing w rflx)  . Hemoglobin A1c

## 2019-02-05 NOTE — Patient Instructions (Addendum)
Health Maintenance, Male A healthy lifestyle and preventive care is important for your health and wellness. Ask your health care provider about what schedule of regular examinations is right for you. What should I know about weight and diet? Eat a Healthy Diet  Eat plenty of vegetables, fruits, whole grains, low-fat dairy products, and lean protein.  Do not eat a lot of foods high in solid fats, added sugars, or salt.  Maintain a Healthy Weight Regular exercise can help you achieve or maintain a healthy weight. You should:  Do at least 150 minutes of exercise each week. The exercise should increase your heart rate and make you sweat (moderate-intensity exercise).  Do strength-training exercises at least twice a week. Watch Your Levels of Cholesterol and Blood Lipids  Have your blood tested for lipids and cholesterol every 5 years starting at 42 years of age. If you are at high risk for heart disease, you should start having your blood tested when you are 42 years old. You may need to have your cholesterol levels checked more often if: ? Your lipid or cholesterol levels are high. ? You are older than 42 years of age. ? You are at high risk for heart disease. What should I know about cancer screening? Many types of cancers can be detected early and may often be prevented. Lung Cancer  You should be screened every year for lung cancer if: ? You are a current smoker who has smoked for at least 30 years. ? You are a former smoker who has quit within the past 15 years.  Talk to your health care provider about your screening options, when you should start screening, and how often you should be screened. Colorectal Cancer  Routine colorectal cancer screening usually begins at 42 years of age and should be repeated every 5-10 years until you are 42 years old. You may need to be screened more often if early forms of precancerous polyps or small growths are found. Your health care provider  may recommend screening at an earlier age if you have risk factors for colon cancer.  Your health care provider may recommend using home test kits to check for hidden blood in the stool.  A small camera at the end of a tube can be used to examine your colon (sigmoidoscopy or colonoscopy). This checks for the earliest forms of colorectal cancer. Prostate and Testicular Cancer  Depending on your age and overall health, your health care provider may do certain tests to screen for prostate and testicular cancer.  Talk to your health care provider about any symptoms or concerns you have about testicular or prostate cancer. Skin Cancer  Check your skin from head to toe regularly.  Tell your health care provider about any new moles or changes in moles, especially if: ? There is a change in a mole's size, shape, or color. ? You have a mole that is larger than a pencil eraser.  Always use sunscreen. Apply sunscreen liberally and repeat throughout the day.  Protect yourself by wearing long sleeves, pants, a wide-brimmed hat, and sunglasses when outside. What should I know about heart disease, diabetes, and high blood pressure?  If you are 93-75 years of age, have your blood pressure checked every 3-5 years. If you are 71 years of age or older, have your blood pressure checked every year. You should have your blood pressure measured twice-once when you are at a hospital or clinic, and once when you are not at a  hospital or clinic. Record the average of the two measurements. To check your blood pressure when you are not at a hospital or clinic, you can use: ? An automated blood pressure machine at a pharmacy. ? A home blood pressure monitor.  Talk to your health care provider about your target blood pressure.  If you are between 39-46 years old, ask your health care provider if you should take aspirin to prevent heart disease.  Have regular diabetes screenings by checking your fasting blood sugar  level. ? If you are at a normal weight and have a low risk for diabetes, have this test once every three years after the age of 15. ? If you are overweight and have a high risk for diabetes, consider being tested at a younger age or more often.  A one-time screening for abdominal aortic aneurysm (AAA) by ultrasound is recommended for men aged 65-75 years who are current or former smokers. What should I know about preventing infection? Hepatitis B If you have a higher risk for hepatitis B, you should be screened for this virus. Talk with your health care provider to find out if you are at risk for hepatitis B infection. Hepatitis C Blood testing is recommended for:  Everyone born from [birth year] through 1964.  Anyone with known risk factors for hepatitis C. Sexually Transmitted Diseases (STDs)  You should be screened each year for STDs including gonorrhea and chlamydia if: ? You are sexually active and are younger than 42 years of age. ? You are older than 42 years of age and your health care provider tells you that you are at risk for this type of infection. ? Your sexual activity has changed since you were last screened and you are at an increased risk for chlamydia or gonorrhea. Ask your health care provider if you are at risk.  Talk with your health care provider about whether you are at high risk of being infected with HIV. Your health care provider may recommend a prescription medicine to help prevent HIV infection. What else can I do?  Schedule regular health, dental, and eye exams.  Stay current with your vaccines (immunizations).  Do not use any tobacco products, such as cigarettes, chewing tobacco, and e-cigarettes. If you need help quitting, ask your health care provider.  Limit alcohol intake to no more than 2 drinks per day. One drink equals 12 ounces of beer, 5 ounces of wine, or 1 ounces of hard liquor.  Do not use street drugs.  Do not share needles.  Ask your health  care provider for help if you need support or information about quitting drugs.  Tell your health care provider if you often feel depressed.  Tell your health care provider if you have ever been abused or do not feel safe at home. This information is not intended to replace advice given to you by your health care provider. Make sure you discuss any questions you have with your health care provider. Document Released: 05/26/2008 Document Revised: 07/27/2016 Document Reviewed: 09/01/2015 Elsevier Interactive Patient Education  2019 [employer].  Try to follow the DASH guidelines (DASH stands for Dietary Approaches to Stop Hypertension). Try to limit the sodium in your diet to no more than 1,500mg  of sodium per day. Certainly try to not exceed 2,000 mg per day at the very most. Do not add salt when cooking or at the table.  Check the sodium amount on labels when shopping, and choose items lower in sodium when given  a choice. Avoid or limit foods that already contain a lot of sodium. Eat a diet rich in fruits and vegetables and whole grains, and try to lose weight if overweight or obese  Check out the information at familydoctor.org entitled "Nutrition for Weight Loss: What You Need to Know about Fad Diets" Try to lose between 1-2 pounds per week by taking in fewer calories and burning off more calories You can succeed by limiting portions, limiting foods dense in calories and fat, becoming more active, and drinking 8 glasses of water a day (64 ounces) Don't skip meals, especially breakfast, as skipping meals may alter your metabolism Do not use over-the-counter weight loss pills or gimmicks that claim rapid weight loss A healthy BMI (or body mass index) is between 18.5 and 24.9 You can calculate your ideal BMI at the NIH website JobE[employer]ics.hu  Obesity, Adult Obesity is the condition of having too much total body fat. Being overweight or obese  means that your weight is greater than what is considered healthy for your body size. Obesity is determined by a measurement called BMI. BMI is an estimate of body fat and is calculated from height and weight. For adults, a BMI of 30 or higher is considered obese. Obesity can eventually lead to other health concerns and major illnesses, including:  Stroke.  Coronary artery disease (CAD).  Type 2 diabetes.  Some types of cancer, including cancers of the colon, breast, uterus, and gallbladder.  Osteoarthritis.  High blood pressure (hypertension).  High cholesterol.  Sleep apnea.  Gallbladder stones.  Infertility problems. What are the causes? The main cause of obesity is taking in (consuming) more calories than your body uses for energy. Other factors that contribute to this condition may include:  Being born with genes that make you more likely to become obese.  Having a medical condition that causes obesity. These conditions include: ? Hypothyroidism. ? Polycystic ovarian syndrome (PCOS). ? Binge-eating disorder. ? Cushing syndrome.  Taking certain medicines, such as steroids, antidepressants, and seizure medicines.  Not being physically active (sedentary lifestyle).  Living where there are limited places to exercise safely or buy healthy foods.  Not getting enough sleep. What increases the risk? The following factors may increase your risk of this condition:  Having a family history of obesity.  Being a woman of African-American descent.  Being a man of Hispanic descent. What are the signs or symptoms? Having excessive body fat is the main symptom of this condition. How is this diagnosed? This condition may be diagnosed based on:  Your symptoms.  Your medical history.  A physical exam. Your health care provider may measure: ? Your BMI. If you are an adult with a BMI between 25 and less than 30, you are considered overweight. If you are an adult with a BMI of  30 or higher, you are considered obese. ? The distances around your hips and your waist (circumferences). These may be compared to each other to help diagnose your condition. ? Your skinfold thickness. Your health care provider may gently pinch a fold of your skin and measure it. How is this treated? Treatment for this condition often includes changing your lifestyle. Treatment may include some or all of the following:  Dietary changes. Work with your health care provider and a dietitian to set a weight-loss goal that is healthy and reasonable for you. Dietary changes may include eating: ? Smaller portions. A portion size is the amount of a particular food that is healthy for  you to eat at one time. This varies from person to person. ? Low-calorie or low-fat options. ? More whole grains, fruits, and vegetables.  Regular physical activity. This may include aerobic activity (cardio) and strength training.  Medicine to help you lose weight. Your health care provider may prescribe medicine if you are unable to lose 1 pound a week after 6 weeks of eating more healthily and doing more physical activity.  Surgery. Surgical options may include gastric banding and gastric bypass. Surgery may be done if: ? Other treatments have not helped to improve your condition. ? You have a BMI of 40 or higher. ? You have life-threatening health problems related to obesity. Follow these instructions at home:  Eating and drinking   Follow recommendations from your health care provider about what you eat and drink. Your health care provider may advise you to: ? Limit fast foods, sweets, and processed snack foods. ? Choose low-fat options, such as low-fat milk instead of whole milk. ? Eat 5 or more servings of fruits or vegetables every day. ? Eat at home more often. This gives you more control over what you eat. ? Choose healthy foods when you eat out. ? Learn what a healthy portion size is. ? Keep low-fat  snacks on hand. ? Avoid sugary drinks, such as soda, fruit juice, iced tea sweetened with sugar, and flavored milk. ? Eat a healthy breakfast.  Drink enough water to keep your urine clear or pale yellow.  Do not go without eating for long periods of time (do not fast) or follow a fad diet. Fasting and fad diets can be unhealthy and even dangerous. Physical Activity  Exercise regularly, as told by your health care provider. Ask your health care provider what types of exercise are safe for you and how often you should exercise.  Warm up and stretch before being active.  Cool down and stretch after being active.  Rest between periods of activity. Lifestyle  Limit the time that you spend in front of your TV, computer, or video game system.  Find ways to reward yourself that do not involve food.  Limit alcohol intake to no more than 1 drink a day for nonpregnant women and 2 drinks a day for men. One drink equals 12 oz of beer, 5 oz of wine, or 1 oz of hard liquor. General instructions  Keep a weight loss journal to keep track of the food you eat and how much you exercise you get.  Take over-the-counter and prescription medicines only as told by your health care provider.  Take vitamins and supplements only as told by your health care provider.  Consider joining a support group. Your health care provider may be able to recommend a support group.  Keep all follow-up visits as told by your health care provider. This is important. Contact a health care provider if:  You are unable to meet your weight loss goal after 6 weeks of dietary and lifestyle changes. This information is not intended to replace advice given to you by your health care provider. Make sure you discuss any questions you have with your health care provider. Document Released: 01/05/2005 Document Revised: 05/02/2016 Document Reviewed: 09/16/2015 Elsevier Interactive Patient Education  2019 Else[employer] Inc.  Preventing  Unhealthy [employer], Adult Staying at a healthy weight is important to your overall health. When fat builds up in your body, you may become overweight or obese. Being overweight or obese increases your risk of developing certain health problems,  such as heart disease, diabetes, sleeping problems, joint problems, and some types of cancer. Unhealthy weight gain is often the result of making unhealthy food choices or not getting enough exercise. You can make changes to your lifestyle to prevent obesity and stay as healthy as possible. What nutrition changes can be made?   Eat only as much as your body needs. To do this: ? Pay attention to signs that you are hungry or full. Stop eating as soon as you feel full. ? If you feel hungry, try drinking water first before eating. Drink enough water so your urine is clear or pale yellow. ? Eat smaller portions. Pay attention to portion sizes when eating out. ? Look at serving sizes on food labels. Most foods contain more than one serving per container. ? Eat the recommended number of calories for your gender and activity level. For most active people, a daily total of 2,000 calories is appropriate. If you are trying to lose weight or are not very active, you may need to eat fewer calories. Talk with your health care provider or a diet and nutrition specialist (dietitian) about how many calories you need each day.  Choose healthy foods, such as: ? Fruits and vegetables. At each meal, try to fill at least half of your plate with fruits and vegetables. ? Whole grains, such as whole-wheat bread, brown rice, and quinoa. ? Lean meats, such as chicken or fish. ? Other healthy proteins, such as beans, eggs, or tofu. ? Healthy fats, such as nuts, seeds, fatty fish, and olive oil. ? Low-fat or fat-free dairy products.  Check food labels, and avoid food and drinks that: ? Are high in calories. ? Have added sugar. ? Are high in sodium. ? Have saturated fats or  trans fats.  Cook foods in healthier ways, such as by baking, broiling, or grilling.  Make a meal plan for the week, and shop with a grocery list to help you stay on track with your purchases. Try to avoid going to the grocery store when you are hungry.  When grocery shopping, try to shop around the outside of the store first, where the fresh foods are. Doing this helps you to avoid prepackaged foods, which can be high in sugar, salt (sodium), and fat. What lifestyle changes can be made?   Exercise for 30 or more minutes on 5 or more days each week. Exercising may include brisk walking, yard work, biking, running, swimming, and team sports like basketball and soccer. Ask your health care provider which exercises are safe for you.  Do muscle-strengthening activities, such as lifting weights or using resistance bands, on 2 or more days a week.  Do not use any products that contain nicotine or tobacco, such as cigarettes and e-cigarettes. If you need help quitting, ask your health care provider.  Limit alcohol intake to no more than 1 drink a day for nonpregnant women and 2 drinks a day for men. One drink equals 12 oz of beer, 5 oz of wine, or 1 oz of hard liquor.  Try to get 7-9 hours of sleep each night. What other changes can be made?  Keep a food and activity journal to keep track of: ? What you ate and how many calories you had. Remember to count the calories in sauces, dressings, and side dishes. ? Whether you were active, and what exercises you did. ? Your calorie, weight, and activity goals.  Check your weight regularly. Track any changes. If you notice  you have gained weight, make changes to your diet or activity routine.  Avoid taking weight-loss medicines or supplements. Talk to your health care provider before starting any new medicine or supplement.  Talk to your health care provider before trying any new diet or exercise plan. Why are these changes important? Eating  healthy, staying active, and having healthy habits can help you to prevent obesity. Those changes also:  Help you manage stress and emotions.  Help you connect with friends and family.  Improve your self-esteem.  Improve your sleep.  Prevent long-term health problems. What can happen if changes are not made? Being obese or overweight can cause you to develop joint or bone problems, which can make it hard for you to stay active or do activities you enjoy. Being obese or overweight also puts stress on your heart and lungs and can lead to health problems like diabetes, heart disease, and some cancers. Where to find more information Talk with your health care provider or a dietitian about healthy eating and healthy lifestyle choices. You may also find information from:  [place]. Department of Agriculture, MyPlate: https://ball-collins.biz/  American Heart Association: www.heart.org  Centers for Disease Control and Prevention: FootballExhibition.com.br Summary  Staying at a healthy weight is important to your overall health. It helps you to prevent certain diseases and health problems, such as heart disease, diabetes, joint problems, sleep disorders, and some types of cancer.  Being obese or overweight can cause you to develop joint or bone problems, which can make it hard for you to stay active or do activities you enjoy.  You can prevent unhealthy weight gain by eating a healthy diet, exercising regularly, not smoking, limiting alcohol, and getting enough sleep.  Talk with your health care provider or a dietitian for guidance about healthy eating and healthy lifestyle choices. This information is not intended to replace advice given to you by your health care provider. Make sure you discuss any questions you have with your health care provider. Document Released: 11/29/2016 Document Revised: 09/08/2017 Document Reviewed: 01/04/2017 Elsevier Interactive Patient Education  2019 [employer].

## 2019-02-06 ENCOUNTER — Other Ambulatory Visit: Payer: Self-pay | Admitting: Family Medicine

## 2019-02-06 DIAGNOSIS — D649 Anemia, unspecified: Secondary | ICD-10-CM

## 2019-02-06 LAB — HEMOGLOBIN A1C
Hgb A1c MFr Bld: 5.2 % of total Hgb (ref <5.7)
Mean Plasma Glucose: 103 (calc)
eAG (mmol/L): 5.7 (calc)

## 2019-02-06 LAB — COMPREHENSIVE METABOLIC PANEL
AG Ratio: 2.2 (calc) (ref 1.0–2.5)
ALT: 30 U/L (ref 9–46)
AST: 24 U/L (ref 10–40)
Albumin: 4.7 g/dL (ref 3.6–5.1)
Alkaline phosphatase (APISO): 44 U/L (ref 36–130)
BUN: 11 mg/dL (ref 7–25)
CHLORIDE: 103 mmol/L (ref 98–110)
CO2: 26 mmol/L (ref 20–32)
Calcium: 9.8 mg/dL (ref 8.6–10.3)
Creat: 0.93 mg/dL (ref 0.60–1.35)
GLOBULIN: 2.1 g/dL (ref 1.9–3.7)
Glucose, Bld: 86 mg/dL (ref 65–99)
Potassium: 4.3 mmol/L (ref 3.5–5.3)
Sodium: 140 mmol/L (ref 135–146)
Total Bilirubin: 0.4 mg/dL (ref 0.2–1.2)
Total Protein: 6.8 g/dL (ref 6.1–8.1)

## 2019-02-06 LAB — CBC
HCT: 38.6 % (ref 38.5–50.0)
Hemoglobin: 13 g/dL — ABNORMAL LOW (ref 13.2–17.1)
MCH: 29 pg (ref 27.0–33.0)
MCHC: 33.7 g/dL (ref 32.0–36.0)
MCV: 86.2 fL (ref 80.0–100.0)
MPV: 9.5 fL (ref 7.5–12.5)
Platelets: 294 10*3/uL (ref 140–400)
RBC: 4.48 10*6/uL (ref 4.20–5.80)
RDW: 12.5 % (ref 11.0–15.0)
WBC: 7.6 10*3/uL (ref 3.8–10.8)

## 2019-02-06 LAB — LIPID PANEL
Cholesterol: 120 mg/dL (ref <200)
HDL: 38 mg/dL — ABNORMAL LOW (ref >=40)
LDL Cholesterol (Calc): 65 mg/dL (calc)
NON-HDL CHOLESTEROL (CALC): 82 mg/dL (ref <130)
Total CHOL/HDL Ratio: 3.2 (calc) (ref <5.0)
Triglycerides: 83 mg/dL (ref <150)

## 2019-02-06 LAB — HIV ANTIBODY (ROUTINE TESTING W REFLEX): HIV 1&2 Ab, 4th Generation: NONREACTIVE

## 2019-02-06 LAB — TSH: TSH: 1.74 mIU/L (ref 0.40–4.50)

## 2019-02-06 NOTE — Progress Notes (Signed)
Recheck CBC in one month

## 2019-02-08 ENCOUNTER — Ambulatory Visit: Payer: BC Managed Care – PPO | Admitting: Anesthesiology

## 2019-02-08 ENCOUNTER — Ambulatory Visit
Admission: RE | Admit: 2019-02-08 | Discharge: 2019-02-08 | Disposition: A | Discharge status: Home / Self Care | Payer: BC Managed Care – PPO | Attending: Gastroenterology | Admitting: Gastroenterology

## 2019-02-08 ENCOUNTER — Encounter
Admission: RE | Disposition: A | Discharge status: Home / Self Care | Payer: Self-pay | Source: Home / Self Care | Attending: Gastroenterology

## 2019-02-08 ENCOUNTER — Encounter: Payer: Self-pay | Admitting: *Deleted

## 2019-02-08 ENCOUNTER — Other Ambulatory Visit: Payer: Self-pay

## 2019-02-08 DIAGNOSIS — F419 Anxiety disorder, unspecified: Secondary | ICD-10-CM | POA: Insufficient documentation

## 2019-02-08 DIAGNOSIS — K219 Gastro-esophageal reflux disease without esophagitis: Secondary | ICD-10-CM | POA: Diagnosis not present

## 2019-02-08 DIAGNOSIS — Z79899 Other long term (current) drug therapy: Secondary | ICD-10-CM | POA: Diagnosis not present

## 2019-02-08 DIAGNOSIS — K228 Other specified diseases of esophagus: Secondary | ICD-10-CM | POA: Insufficient documentation

## 2019-02-08 DIAGNOSIS — Z87891 Personal history of nicotine dependence: Secondary | ICD-10-CM | POA: Insufficient documentation

## 2019-02-08 DIAGNOSIS — K210 Gastro-esophageal reflux disease with esophagitis: Secondary | ICD-10-CM | POA: Insufficient documentation

## 2019-02-08 HISTORY — PX: ESOPHAGOGASTRODUODENOSCOPY (EGD) WITH PROPOFOL: SHX5813

## 2019-02-08 SURGERY — ESOPHAGOGASTRODUODENOSCOPY (EGD) WITH PROPOFOL
Anesthesia: General

## 2019-02-08 MED ORDER — FENTANYL CITRATE (PF) 100 MCG/2ML IJ SOLN
INTRAMUSCULAR | Status: DC | PRN
  Administered 2019-02-08: 50 ug via INTRAVENOUS

## 2019-02-08 MED ORDER — LIDOCAINE HCL (CARDIAC) PF 100 MG/5ML IV SOSY
PREFILLED_SYRINGE | INTRAVENOUS | Status: DC | PRN
  Administered 2019-02-08: 50 mg via INTRAVENOUS

## 2019-02-08 MED ORDER — FENTANYL CITRATE (PF) 100 MCG/2ML IJ SOLN
INTRAMUSCULAR | Status: AC
  Filled 2019-02-08: qty 2

## 2019-02-08 MED ORDER — PROPOFOL 10 MG/ML IV BOLUS
INTRAVENOUS | Status: AC
  Filled 2019-02-08: qty 20

## 2019-02-08 MED ORDER — MIDAZOLAM HCL 2 MG/2ML IJ SOLN
INTRAMUSCULAR | Status: AC
  Filled 2019-02-08: qty 2

## 2019-02-08 MED ORDER — SODIUM CHLORIDE 0.9 % IV SOLN
INTRAVENOUS | Status: DC
  Administered 2019-02-08: 07:00:00 via INTRAVENOUS

## 2019-02-08 MED ORDER — PROPOFOL 10 MG/ML IV BOLUS
INTRAVENOUS | Status: DC | PRN
  Administered 2019-02-08 (×4): 10 mg via INTRAVENOUS
  Administered 2019-02-08: 90 mg via INTRAVENOUS

## 2019-02-08 MED ORDER — MIDAZOLAM HCL 2 MG/2ML IJ SOLN
INTRAMUSCULAR | Status: DC | PRN
  Administered 2019-02-08: 2 mg via INTRAVENOUS

## 2019-02-08 MED ORDER — LIDOCAINE HCL (PF) 2 % IJ SOLN
INTRAMUSCULAR | Status: AC
  Filled 2019-02-08: qty 10

## 2019-02-08 NOTE — Anesthesia Preprocedure Evaluation (Signed)
Anesthesia Evaluation  Patient identified by MRN, date of birth, ID band Patient awake    Reviewed: Allergy & Precautions, H&P , NPO status , Patient's Chart, lab work & pertinent test results, reviewed documented beta blocker date and time   Airway Mallampati: I  TM Distance: >3 FB Neck ROM: full    Dental  (+) Dental Advidsory Given, Teeth Intact   Pulmonary neg pulmonary ROS, former smoker,           Cardiovascular Exercise Tolerance: Good negative cardio ROS       Neuro/Psych negative neurological ROS  negative psych ROS   GI/Hepatic Neg liver ROS, GERD  ,  Endo/Other  negative endocrine ROS  Renal/GU negative Renal ROS  negative genitourinary   Musculoskeletal   Abdominal   Peds  Hematology negative hematology ROS (+)   Anesthesia Other Findings Past Medical History: No date: Anxiety No date: Eczema No date: Esophageal stricture No date: GERD (gastroesophageal reflux disease) No date: TMJ (dislocation of temporomandibular joint)   Reproductive/Obstetrics negative OB ROS                             Anesthesia Physical Anesthesia Plan  ASA: II  Anesthesia Plan: General   Post-op Pain Management:    Induction: Intravenous  PONV Risk Score and Plan: 2 and Propofol infusion and TIVA  Airway Management Planned: Natural Airway and Nasal Cannula  Additional Equipment:   Intra-op Plan:   Post-operative Plan:   Informed Consent: I have reviewed the patients History and Physical, chart, labs and discussed the procedure including the risks, benefits and alternatives for the proposed anesthesia with the patient or authorized representative who has indicated his/her understanding and acceptance.     Dental Advisory Given  Plan Discussed with: Anesthesiologist, CRNA and Surgeon  Anesthesia Plan Comments:         Anesthesia Quick Evaluation

## 2019-02-08 NOTE — Op Note (Signed)
[facility] Gastroenterology Patient Name: Joe Patterson Procedure Date: 02/08/2019 7:22 AM MRN: [id number] Account #: [id number] Date of Birth: Nov 08, 1976 Admit Type: Outpatient Age: 43 Room: [facility] ENDO ROOM 4 Gender: Male Note Status: Finalized Procedure:            Upper GI endoscopy Indications:          Screening for Barrett's esophagus Providers:            Wyline Mood MD, MD Referring MD:         Kerman Passey (Referring MD) Medicines:            Monitored Anesthesia Care Complications:        No immediate complications. Procedure:            Pre-Anesthesia Assessment:                       - Prior to the procedure, a History and Physical was                        performed, and patient medications, allergies and                        sensitivities were reviewed. The patient's tolerance of                        previous anesthesia was reviewed.                       - The risks and benefits of the procedure and the                        sedation options and risks were discussed with the                        patient. All questions were answered and informed                        consent was obtained.                       - ASA Grade Assessment: II - A patient with mild                        systemic disease.                       After obtaining informed consent, the endoscope was                        passed under direct vision. Throughout the procedure,                        the patient's blood pressure, pulse, and oxygen                        saturations were monitored continuously. The Endoscope                        was introduced through the mouth, and advanced to the  third part of duodenum. The upper GI endoscopy was                        accomplished with ease. The patient tolerated the                        procedure well. Findings:      The examined duodenum was normal.      A large amount of food (residue)  was found in the entire examined       stomach.      One tongue of salmon-colored mucosa was present from 40 to 41 cm. No       other visible abnormalities were present. The maximum longitudinal       extent of these esophageal mucosal changes was 1 cm in length. Biopsies       were taken with a cold forceps for histology. Impression:           - Normal examined duodenum.                       - A large amount of food (residue) in the stomach.                       - Salmon-colored mucosa suspicious for short-segment                        Barrett's esophagus. Biopsied. Recommendation:       - Discharge patient to home (with escort).                       - Resume previous diet.                       - Continue present medications.                       - Await pathology results.                       - Return to my office in 6 weeks. Procedure Code(s):    --- Professional ---                       [id number], Esophagogastroduodenoscopy, flexible, transoral;                        with biopsy, single or multiple Diagnosis Code(s):    --- Professional ---                       K22.8, Other specified diseases of esophagus                       Z13.810, Encounter for screening for upper                        gastrointestinal disorder CPT copyright 2018 American Medical Association. All rights reserved. The codes documented in this report are preliminary and upon coder review may  be revised to meet current compliance requirements. Wyline Mood, MD Wyline Mood MD, MD 02/08/2019 7:53:44 AM This report has been signed electronically. Number of Addenda: 0 Note Initiated On: 02/08/2019 7:22 AM      [facility]  [facility]

## 2019-02-08 NOTE — H&P (Signed)
Joe Mood, MD [address], [address], [place], [place], [address] [address], [address], [place], [place], [address] Phone: [phone]  Fax: [phone]  Primary Care Physician:  Kerman Passey, MD   Pre-Procedure History & Physical: HPI:  Joe Patterson is a 43 y.o. male is here for an endoscopy    Past Medical History:  Diagnosis Date  . Anxiety   . Eczema   . Esophageal stricture   . GERD (gastroesophageal reflux disease)   . TMJ (dislocation of temporomandibular joint)     Past Surgical History:  Procedure Laterality Date  . COLONOSCOPY, ESOPHAGOGASTRODUODENOSCOPY (EGD) AND ESOPHAGEAL DILATION    . HERNIA REPAIR    . TYMPANOSTOMY TUBE PLACEMENT     as a child    Prior to Admission medications   Medication Sig Start Date End Date Taking? Authorizing Provider  atorvastatin (LIPITOR) 20 MG tablet Take 1 tablet (20 mg total) by mouth at bedtime. 11/21/18  Yes Lada, Janit Bern, MD  betamethasone dipropionate (DIPROLENE) 0.05 % cream Apply topically 2 (two) times daily. Use SPARINGLY; can cause stretch marks 11/21/18  Yes Lada, Janit Bern, MD  cetirizine (ZYRTEC) 10 MG tablet Take 1 tablet (10 mg total) by mouth daily. 11/14/17  Yes Lada, Janit Bern, MD  montelukast (SINGULAIR) 10 MG tablet TAKE 1 TABLET BY MOUTH EVERYDAY AT BEDTIME 11/21/18  Yes Lada, Janit Bern, MD  omeprazole (PRILOSEC) 20 MG capsule Take 1 capsule (20 mg total) by mouth 2 (two) times daily before a meal. 01/16/19 04/16/19 Yes Joe Mood, MD  sertraline (ZOLOFT) 50 MG tablet Take 1 tablet (50 mg total) by mouth daily. 11/21/18  Yes Lada, Janit Bern, MD  triamcinolone cream (KENALOG) 0.1 % Apply 1 application topically 2 (two) times daily. If needed (weaker strength) 11/21/18  Yes Lada, Janit Bern, MD  valACYclovir (VALTREX) 500 MG tablet Take 1 tablet (500 mg total) by mouth 2 (two) times daily. Take twice a day for 3 days within 24 hours of symptom onset 11/21/18  Yes Lada, Janit Bern, MD    Allergies  as of 01/16/2019  . (No Known Allergies)    Family History  Problem Relation Age of Onset  . Multiple sclerosis Mother   . Hearing loss Father   . Cancer Maternal Grandmother        lymphoma  . Heart disease Maternal Grandfather        early 56s  . Lung disease Paternal Grandmother   . Blindness Paternal Grandmother   . Parkinson's disease Paternal Grandfather   . COPD Neg Hx   . Diabetes Neg Hx   . Hypertension Neg Hx   . Stroke Neg Hx   . Colon cancer Neg Hx     Social History   Socioeconomic History  . Marital status: Single    Spouse name: Not on file  . Number of children: Not on file  . Years of education: 60  . Highest education level: Bachelor's degree (e.g., BA, AB, BS)  Occupational History  . Occupation: [employer]  Social Needs  . Financial resource strain: Not hard at all  . Food insecurity:    Worry: Never true    Inability: Never true  . Transportation needs:    Medical: No    Non-medical: No  Tobacco Use  . Smoking status: Former Smoker    Packs/day: 1.00    Years: 15.00    Pack years: 15.00    Types: Cigarettes  Last attempt to quit: 12/23/2007    Years since quitting: 11.1  . Smokeless tobacco: Former Neurosurgeon    Types: Chew    Quit date: 2009  Substance and Sexual Activity  . Alcohol use: No  . Drug use: No  . Sexual activity: Yes  Lifestyle  . Physical activity:    Days per week: 2 days    Minutes per session: 30 min  . Stress: Not at all  Relationships  . Social connections:    Talks on phone: Three times a week    Gets together: Three times a week    Attends religious service: Never    Active member of club or organization: No    Attends meetings of clubs or organizations: Never    Relationship status: Not on file  . Intimate partner violence:    Fear of current or ex partner: Not on file    Emotionally abused: Not on file    Physically abused: Not on file    Forced sexual activity: Not on file  Other Topics Concern  . Not  on file  Social History Narrative  . Not on file    Review of Systems: See HPI, otherwise negative ROS  Physical Exam: BP (!) 143/98   Pulse 78   Temp (!) 97.2 F (36.2 C) (Tympanic)   Ht 5' 10.5" (1.791 m)   Wt 102.1 kg   SpO2 98%   BMI 31.83 kg/m  General:   Alert,  pleasant and cooperative in NAD Head:  Normocephalic and atraumatic. Neck:  Supple; no masses or thyromegaly. Lungs:  Clear throughout to auscultation, normal respiratory effort.    Heart:  +S1, +S2, Regular rate and rhythm, No edema. Abdomen:  Soft, nontender and nondistended. Normal bowel sounds, without guarding, and without rebound.   Neurologic:  Alert and  oriented x4;  grossly normal neurologically.  Impression/Plan: Joe Patterson is here for an endoscopy  to be performed for  evaluation of barretts esophagus .     Risks, benefits, limitations, and alternatives regarding endoscopy have been reviewed with the patient.  Questions have been answered.  All parties agreeable.   Joe Mood, MD  02/08/2019, 7:43 AM

## 2019-02-08 NOTE — Anesthesia Post-op Follow-up Note (Signed)
Anesthesia QCDR form completed.        

## 2019-02-08 NOTE — Anesthesia Postprocedure Evaluation (Signed)
Anesthesia Post Note  Patient: Joe Patterson  Procedure(s) Performed: ESOPHAGOGASTRODUODENOSCOPY (EGD) WITH PROPOFOL (N/A )  Patient location during evaluation: Endoscopy Anesthesia Type: General Level of consciousness: awake and alert Pain management: pain level controlled Vital Signs Assessment: post-procedure vital signs reviewed and stable Respiratory status: spontaneous breathing, nonlabored ventilation, respiratory function stable and patient connected to nasal cannula oxygen Cardiovascular status: blood pressure returned to baseline and stable Postop Assessment: no apparent nausea or vomiting Anesthetic complications: no     Last Vitals:  Vitals:   02/08/19 0815 02/08/19 0823  BP: 122/80 128/84  Pulse:  80  Resp:  18  Temp:    SpO2:  98%    Last Pain:  Vitals:   02/08/19 0823  TempSrc:   PainSc: 0-No pain                 Lenard Simmer

## 2019-02-08 NOTE — Transfer of Care (Signed)
Immediate Anesthesia Transfer of Care Note  Patient: Joe Patterson  Procedure(s) Performed: ESOPHAGOGASTRODUODENOSCOPY (EGD) WITH PROPOFOL (N/A )  Patient Location: PACU and Endoscopy Unit  Anesthesia Type:General  Level of Consciousness: drowsy  Airway & Oxygen Therapy: Patient Spontanous Breathing and Patient connected to nasal cannula oxygen  Post-op Assessment: Report given to RN and Post -op Vital signs reviewed and stable  Post vital signs: Reviewed and stable  Last Vitals:  Vitals Value Taken Time  BP 110/73 02/08/2019  7:56 AM  Temp 97.2   Pulse 82 02/08/2019  7:56 AM  Resp 16 02/08/2019  7:56 AM  SpO2 96 % 02/08/2019  7:56 AM  Vitals shown include unvalidated device data.  Last Pain:  Vitals:   02/08/19 0755  TempSrc: (P) Tympanic  PainSc:          Complications: No apparent anesthesia complications

## 2019-02-09 NOTE — Progress Notes (Signed)
Voicemail. No message left.

## 2019-02-11 LAB — SURGICAL PATHOLOGY

## 2019-03-01 ENCOUNTER — Encounter: Payer: Self-pay | Admitting: Gastroenterology

## 2019-03-27 ENCOUNTER — Ambulatory Visit: Payer: BC Managed Care – PPO | Admitting: Gastroenterology

## 2019-04-15 ENCOUNTER — Other Ambulatory Visit: Payer: Self-pay | Admitting: Family Medicine

## 2019-04-15 DIAGNOSIS — L239 Allergic contact dermatitis, unspecified cause: Secondary | ICD-10-CM

## 2019-05-29 ENCOUNTER — Telehealth: Payer: Self-pay | Admitting: Gastroenterology

## 2019-05-29 ENCOUNTER — Other Ambulatory Visit: Payer: Self-pay

## 2019-05-29 MED ORDER — OMEPRAZOLE 20 MG PO CPDR: 20.0000 mg | DELAYED_RELEASE_CAPSULE | Freq: Two times a day (BID) | ORAL | 3 refills | Status: DC

## 2019-05-29 NOTE — Telephone Encounter (Signed)
Patient called to r/s appt & will need his meds refilled before this time on   omeprazole (PRILOSEC) 20 MG capsule (Expired)   Called into [facility] in [place].  Appointment 07-17-2019

## 2019-06-05 ENCOUNTER — Ambulatory Visit: Payer: BC Managed Care – PPO | Admitting: Gastroenterology

## 2019-06-19 ENCOUNTER — Other Ambulatory Visit: Payer: Self-pay | Admitting: Nurse Practitioner

## 2019-06-19 DIAGNOSIS — L239 Allergic contact dermatitis, unspecified cause: Secondary | ICD-10-CM

## 2019-06-20 ENCOUNTER — Other Ambulatory Visit: Payer: Self-pay | Admitting: Nurse Practitioner

## 2019-06-20 DIAGNOSIS — L239 Allergic contact dermatitis, unspecified cause: Secondary | ICD-10-CM

## 2019-07-16 ENCOUNTER — Telehealth: Payer: Self-pay | Admitting: Gastroenterology

## 2019-07-16 NOTE — Telephone Encounter (Signed)
Am ok with that

## 2019-07-16 NOTE — Telephone Encounter (Signed)
I called the patient to r/s his appt on 07-17-19 or to change to virtual. The patient wants face to face & early am appointments. Since Dr Georgeann Oppenheim schedule does not allow this in August & September he would like to be referred to another md in the practice that can meet this need. Please advise.

## 2019-07-17 ENCOUNTER — Ambulatory Visit: Payer: BC Managed Care – PPO | Admitting: Gastroenterology

## 2019-07-27 ENCOUNTER — Observation Stay
Admission: EM | Admit: 2019-07-27 | Discharge: 2019-07-28 | Disposition: A | Discharge status: Home / Self Care | Payer: BC Managed Care – PPO | Attending: Surgery | Admitting: Surgery

## 2019-07-27 ENCOUNTER — Other Ambulatory Visit: Payer: Self-pay

## 2019-07-27 ENCOUNTER — Encounter
Admission: EM | Disposition: A | Discharge status: Home / Self Care | Payer: Self-pay | Source: Home / Self Care | Attending: Emergency Medicine

## 2019-07-27 ENCOUNTER — Emergency Department: Payer: BC Managed Care – PPO | Admitting: Anesthesiology

## 2019-07-27 ENCOUNTER — Emergency Department: Payer: BC Managed Care – PPO

## 2019-07-27 DIAGNOSIS — R1031 Right lower quadrant pain: Secondary | ICD-10-CM | POA: Diagnosis present

## 2019-07-27 DIAGNOSIS — E781 Pure hyperglyceridemia: Secondary | ICD-10-CM | POA: Insufficient documentation

## 2019-07-27 DIAGNOSIS — Z87891 Personal history of nicotine dependence: Secondary | ICD-10-CM | POA: Diagnosis not present

## 2019-07-27 DIAGNOSIS — F419 Anxiety disorder, unspecified: Secondary | ICD-10-CM | POA: Insufficient documentation

## 2019-07-27 DIAGNOSIS — Z20828 Contact with and (suspected) exposure to other viral communicable diseases: Secondary | ICD-10-CM | POA: Insufficient documentation

## 2019-07-27 DIAGNOSIS — K3580 Unspecified acute appendicitis: Secondary | ICD-10-CM | POA: Diagnosis not present

## 2019-07-27 DIAGNOSIS — Z6830 Body mass index (BMI) 30.0-30.9, adult: Secondary | ICD-10-CM | POA: Insufficient documentation

## 2019-07-27 DIAGNOSIS — E785 Hyperlipidemia, unspecified: Secondary | ICD-10-CM | POA: Insufficient documentation

## 2019-07-27 DIAGNOSIS — K219 Gastro-esophageal reflux disease without esophagitis: Secondary | ICD-10-CM | POA: Insufficient documentation

## 2019-07-27 DIAGNOSIS — K37 Unspecified appendicitis: Secondary | ICD-10-CM

## 2019-07-27 DIAGNOSIS — K3533 Acute appendicitis with perforation and localized peritonitis, with abscess: Secondary | ICD-10-CM | POA: Diagnosis not present

## 2019-07-27 DIAGNOSIS — Z79899 Other long term (current) drug therapy: Secondary | ICD-10-CM | POA: Diagnosis not present

## 2019-07-27 HISTORY — PX: LAPAROSCOPIC APPENDECTOMY: SHX408

## 2019-07-27 HISTORY — DX: Unspecified appendicitis: K37

## 2019-07-27 LAB — URINALYSIS, COMPLETE (UACMP) WITH MICROSCOPIC
Bacteria, UA: NONE SEEN
Bilirubin Urine: NEGATIVE
Glucose, UA: NEGATIVE mg/dL
Hgb urine dipstick: NEGATIVE
Ketones, ur: NEGATIVE mg/dL
Leukocytes,Ua: NEGATIVE
Nitrite: NEGATIVE
Protein, ur: NEGATIVE mg/dL
Specific Gravity, Urine: 1.039 — ABNORMAL HIGH (ref 1.005–1.030)
Squamous Epithelial / HPF: NONE SEEN (ref 0–5)
WBC, UA: NONE SEEN WBC/hpf (ref 0–5)
pH: 7 (ref 5.0–8.0)

## 2019-07-27 LAB — CBC
HCT: 42.3 % (ref 39.0–52.0)
Hemoglobin: 14.1 g/dL (ref 13.0–17.0)
MCH: 29.1 pg (ref 26.0–34.0)
MCHC: 33.3 g/dL (ref 30.0–36.0)
MCV: 87.2 fL (ref 80.0–100.0)
Platelets: 283 10*3/uL (ref 150–400)
RBC: 4.85 MIL/uL (ref 4.22–5.81)
RDW: 12.3 % (ref 11.5–15.5)
WBC: 16.6 10*3/uL — ABNORMAL HIGH (ref 4.0–10.5)
nRBC: 0 % (ref 0.0–0.2)

## 2019-07-27 LAB — COMPREHENSIVE METABOLIC PANEL
ALT: 31 U/L (ref 0–44)
AST: 28 U/L (ref 15–41)
Albumin: 4.7 g/dL (ref 3.5–5.0)
Alkaline Phosphatase: 51 U/L (ref 38–126)
Anion gap: 8 (ref 5–15)
BUN: 9 mg/dL (ref 6–20)
CO2: 25 mmol/L (ref 22–32)
Calcium: 9.6 mg/dL (ref 8.9–10.3)
Chloride: 108 mmol/L (ref 98–111)
Creatinine, Ser: 0.72 mg/dL (ref 0.61–1.24)
GFR calc Af Amer: >60 mL/min (ref >60)
GFR calc non Af Amer: >60 mL/min (ref >60)
Glucose, Bld: 100 mg/dL — ABNORMAL HIGH (ref 70–99)
Potassium: 3.9 mmol/L (ref 3.5–5.1)
Sodium: 141 mmol/L (ref 135–145)
Total Bilirubin: 0.6 mg/dL (ref 0.3–1.2)
Total Protein: 7.8 g/dL (ref 6.5–8.1)

## 2019-07-27 LAB — LIPASE, BLOOD: Lipase: 30 U/L (ref 11–51)

## 2019-07-27 LAB — SARS CORONAVIRUS 2 BY RT PCR (HOSPITAL ORDER, PERFORMED IN ~~LOC~~ HOSPITAL LAB): SARS Coronavirus 2: NEGATIVE

## 2019-07-27 SURGERY — APPENDECTOMY, LAPAROSCOPIC
Anesthesia: General

## 2019-07-27 MED ORDER — KETOROLAC TROMETHAMINE 30 MG/ML IJ SOLN
30.0000 mg | Freq: Four times a day (QID) | INTRAMUSCULAR | Status: DC
  Administered 2019-07-27 – 2019-07-28 (×2): 30 mg via INTRAVENOUS
  Filled 2019-07-27 (×2): qty 1

## 2019-07-27 MED ORDER — ACETAMINOPHEN 325 MG PO TABS: 325.0000 mg | ORAL_TABLET | ORAL | Status: DC | PRN

## 2019-07-27 MED ORDER — PIPERACILLIN-TAZOBACTAM 3.375 G IVPB 30 MIN
3.3750 g | Freq: Once | INTRAVENOUS | Status: AC
  Administered 2019-07-27: 3.375 g via INTRAVENOUS
  Filled 2019-07-27: qty 50

## 2019-07-27 MED ORDER — PROPOFOL 10 MG/ML IV BOLUS
INTRAVENOUS | Status: AC
  Filled 2019-07-27: qty 20

## 2019-07-27 MED ORDER — PROMETHAZINE HCL 25 MG/ML IJ SOLN: 6.2500 mg | INTRAMUSCULAR | Status: DC | PRN

## 2019-07-27 MED ORDER — PROCHLORPERAZINE EDISYLATE 10 MG/2ML IJ SOLN
5.0000 mg | Freq: Four times a day (QID) | INTRAMUSCULAR | Status: DC | PRN
  Filled 2019-07-27: qty 2

## 2019-07-27 MED ORDER — ONDANSETRON HCL 4 MG/2ML IJ SOLN
INTRAMUSCULAR | Status: AC
  Filled 2019-07-27: qty 2

## 2019-07-27 MED ORDER — SUGAMMADEX SODIUM 200 MG/2ML IV SOLN
INTRAVENOUS | Status: AC
  Filled 2019-07-27: qty 2

## 2019-07-27 MED ORDER — ACETAMINOPHEN 500 MG PO TABS
1000.0000 mg | ORAL_TABLET | Freq: Four times a day (QID) | ORAL | Status: DC
  Administered 2019-07-27 – 2019-07-28 (×2): 1000 mg via ORAL
  Filled 2019-07-27 (×2): qty 2

## 2019-07-27 MED ORDER — ACETAMINOPHEN 10 MG/ML IV SOLN
INTRAVENOUS | Status: DC | PRN
  Administered 2019-07-27: 1000 mg via INTRAVENOUS

## 2019-07-27 MED ORDER — SODIUM CHLORIDE 0.9 % IV SOLN
INTRAVENOUS | Status: DC
  Administered 2019-07-27 – 2019-07-28 (×2): via INTRAVENOUS

## 2019-07-27 MED ORDER — BUPIVACAINE-EPINEPHRINE (PF) 0.25% -1:200000 IJ SOLN
INTRAMUSCULAR | Status: AC
  Filled 2019-07-27: qty 30

## 2019-07-27 MED ORDER — ROCURONIUM BROMIDE 50 MG/5ML IV SOLN
INTRAVENOUS | Status: AC
  Filled 2019-07-27: qty 1

## 2019-07-27 MED ORDER — FENTANYL CITRATE (PF) 100 MCG/2ML IJ SOLN
INTRAMUSCULAR | Status: AC
  Filled 2019-07-27: qty 2

## 2019-07-27 MED ORDER — DEXAMETHASONE SODIUM PHOSPHATE 10 MG/ML IJ SOLN
INTRAMUSCULAR | Status: DC | PRN
  Administered 2019-07-27: 10 mg via INTRAVENOUS

## 2019-07-27 MED ORDER — HYDROCODONE-ACETAMINOPHEN 7.5-325 MG PO TABS: 1.0000 | ORAL_TABLET | Freq: Once | ORAL | Status: DC | PRN

## 2019-07-27 MED ORDER — OXYCODONE HCL 5 MG PO TABS
5.0000 mg | ORAL_TABLET | ORAL | Status: DC | PRN
  Administered 2019-07-27 – 2019-07-28 (×2): 10 mg via ORAL
  Filled 2019-07-27 (×2): qty 2

## 2019-07-27 MED ORDER — MEPERIDINE HCL 50 MG/ML IJ SOLN
6.2500 mg | INTRAMUSCULAR | Status: AC | PRN
  Administered 2019-07-27 (×4): 12.5 mg via INTRAVENOUS

## 2019-07-27 MED ORDER — ROCURONIUM BROMIDE 100 MG/10ML IV SOLN
INTRAVENOUS | Status: DC | PRN
  Administered 2019-07-27: 50 mg via INTRAVENOUS

## 2019-07-27 MED ORDER — IOHEXOL 300 MG/ML  SOLN
100.0000 mL | Freq: Once | INTRAMUSCULAR | Status: AC | PRN
  Administered 2019-07-27: 100 mL via INTRAVENOUS

## 2019-07-27 MED ORDER — DEXMEDETOMIDINE HCL IN NACL 200 MCG/50ML IV SOLN
INTRAVENOUS | Status: DC | PRN
  Administered 2019-07-27: 16 ug via INTRAVENOUS
  Administered 2019-07-27: 20 ug via INTRAVENOUS

## 2019-07-27 MED ORDER — DEXAMETHASONE SODIUM PHOSPHATE 10 MG/ML IJ SOLN
INTRAMUSCULAR | Status: AC
  Filled 2019-07-27: qty 1

## 2019-07-27 MED ORDER — MEPERIDINE HCL 50 MG/ML IJ SOLN: 6.2500 mg | INTRAMUSCULAR | Status: DC | PRN

## 2019-07-27 MED ORDER — FENTANYL CITRATE (PF) 100 MCG/2ML IJ SOLN
INTRAMUSCULAR | Status: DC | PRN
  Administered 2019-07-27 (×2): 50 ug via INTRAVENOUS

## 2019-07-27 MED ORDER — ONDANSETRON 4 MG PO TBDP: 4.0000 mg | ORAL_TABLET | Freq: Four times a day (QID) | ORAL | Status: DC | PRN

## 2019-07-27 MED ORDER — MIDAZOLAM HCL 2 MG/2ML IJ SOLN
INTRAMUSCULAR | Status: AC
  Filled 2019-07-27: qty 2

## 2019-07-27 MED ORDER — ACETAMINOPHEN 160 MG/5ML PO SOLN
325.0000 mg | ORAL | Status: DC | PRN
  Filled 2019-07-27: qty 20.3

## 2019-07-27 MED ORDER — LACTATED RINGERS IV SOLN
INTRAVENOUS | Status: DC | PRN
  Administered 2019-07-27: 14:00:00 via INTRAVENOUS

## 2019-07-27 MED ORDER — LIDOCAINE HCL (PF) 2 % IJ SOLN
INTRAMUSCULAR | Status: AC
  Filled 2019-07-27: qty 10

## 2019-07-27 MED ORDER — PROPOFOL 10 MG/ML IV BOLUS
INTRAVENOUS | Status: DC | PRN
  Administered 2019-07-27: 200 mg via INTRAVENOUS

## 2019-07-27 MED ORDER — SEVOFLURANE IN SOLN
RESPIRATORY_TRACT | Status: AC
  Filled 2019-07-27: qty 250

## 2019-07-27 MED ORDER — MEPERIDINE HCL 50 MG/ML IJ SOLN
INTRAMUSCULAR | Status: AC
  Filled 2019-07-27: qty 1

## 2019-07-27 MED ORDER — SODIUM CHLORIDE 0.9 % IV SOLN
2.0000 g | Freq: Two times a day (BID) | INTRAVENOUS | Status: AC
  Administered 2019-07-27 – 2019-07-28 (×2): 2 g via INTRAVENOUS
  Filled 2019-07-27 (×2): qty 2

## 2019-07-27 MED ORDER — SERTRALINE HCL 50 MG PO TABS
50.0000 mg | ORAL_TABLET | Freq: Every day | ORAL | Status: DC
  Administered 2019-07-27: 50 mg via ORAL
  Filled 2019-07-27 (×2): qty 1

## 2019-07-27 MED ORDER — ACETAMINOPHEN 10 MG/ML IV SOLN
INTRAVENOUS | Status: AC
  Filled 2019-07-27: qty 100

## 2019-07-27 MED ORDER — MIDAZOLAM HCL 2 MG/2ML IJ SOLN
INTRAMUSCULAR | Status: DC | PRN
  Administered 2019-07-27: 2 mg via INTRAVENOUS

## 2019-07-27 MED ORDER — MORPHINE SULFATE (PF) 4 MG/ML IV SOLN: 2.0000 mg | INTRAVENOUS | Status: DC | PRN

## 2019-07-27 MED ORDER — ENOXAPARIN SODIUM 40 MG/0.4ML ~~LOC~~ SOLN
40.0000 mg | SUBCUTANEOUS | Status: DC
  Administered 2019-07-28: 40 mg via SUBCUTANEOUS
  Filled 2019-07-27: qty 0.4

## 2019-07-27 MED ORDER — PROCHLORPERAZINE MALEATE 10 MG PO TABS
10.0000 mg | ORAL_TABLET | Freq: Four times a day (QID) | ORAL | Status: DC | PRN
  Filled 2019-07-27: qty 1

## 2019-07-27 MED ORDER — MONTELUKAST SODIUM 10 MG PO TABS
5.0000 mg | ORAL_TABLET | Freq: Every day | ORAL | Status: DC
  Administered 2019-07-27: 5 mg via ORAL
  Filled 2019-07-27: qty 1

## 2019-07-27 MED ORDER — ONDANSETRON HCL 4 MG/2ML IJ SOLN: 4.0000 mg | Freq: Four times a day (QID) | INTRAMUSCULAR | Status: DC | PRN

## 2019-07-27 MED ORDER — LACTATED RINGERS IV BOLUS
1000.0000 mL | Freq: Once | INTRAVENOUS | Status: AC
  Administered 2019-07-27: 1000 mL via INTRAVENOUS

## 2019-07-27 MED ORDER — DEXMEDETOMIDINE HCL IN NACL 80 MCG/20ML IV SOLN
INTRAVENOUS | Status: AC
  Filled 2019-07-27: qty 20

## 2019-07-27 MED ORDER — PANTOPRAZOLE SODIUM 40 MG PO TBEC
40.0000 mg | DELAYED_RELEASE_TABLET | Freq: Every day | ORAL | Status: DC
  Administered 2019-07-27 – 2019-07-28 (×2): 40 mg via ORAL
  Filled 2019-07-27 (×2): qty 1

## 2019-07-27 MED ORDER — ONDANSETRON HCL 4 MG/2ML IJ SOLN
INTRAMUSCULAR | Status: DC | PRN
  Administered 2019-07-27: 4 mg via INTRAVENOUS

## 2019-07-27 MED ORDER — PIPERACILLIN-TAZOBACTAM 4.5 G IVPB: 4.5000 g | Freq: Once | INTRAVENOUS | Status: DC

## 2019-07-27 MED ORDER — HYDROMORPHONE HCL 1 MG/ML IJ SOLN: 0.2500 mg | INTRAMUSCULAR | Status: DC | PRN

## 2019-07-27 MED ORDER — BUPIVACAINE-EPINEPHRINE 0.25% -1:200000 IJ SOLN
INTRAMUSCULAR | Status: DC | PRN
  Administered 2019-07-27: 30 mL

## 2019-07-27 MED ORDER — LORATADINE 10 MG PO TABS
10.0000 mg | ORAL_TABLET | Freq: Every day | ORAL | Status: DC
  Administered 2019-07-27 – 2019-07-28 (×2): 10 mg via ORAL
  Filled 2019-07-27 (×2): qty 1

## 2019-07-27 MED ORDER — LIDOCAINE HCL (CARDIAC) PF 100 MG/5ML IV SOSY
PREFILLED_SYRINGE | INTRAVENOUS | Status: DC | PRN
  Administered 2019-07-27: 100 mg via INTRAVENOUS

## 2019-07-27 SURGICAL SUPPLY — 37 items
APPLIER CLIP 5 13 M/L LIGAMAX5 (MISCELLANEOUS) ×3
BLADE CLIPPER SURG (BLADE) ×3 IMPLANT
CANISTER SUCT 1200ML W/VALVE (MISCELLANEOUS) ×3 IMPLANT
CHLORAPREP W/TINT 26 (MISCELLANEOUS) ×3 IMPLANT
CLIP APPLIE 5 13 M/L LIGAMAX5 (MISCELLANEOUS) ×1 IMPLANT
COVER WAND RF STERILE (DRAPES) ×3 IMPLANT
CUTTER FLEX LINEAR 45M (STAPLE) ×3 IMPLANT
DERMABOND ADVANCED (GAUZE/BANDAGES/DRESSINGS) ×2
DERMABOND ADVANCED .7 DNX12 (GAUZE/BANDAGES/DRESSINGS) ×1 IMPLANT
ELECT CAUTERY BLADE 6.4 (BLADE) ×3 IMPLANT
ELECT REM PT RETURN 9FT ADLT (ELECTROSURGICAL) ×3
ELECTRODE REM PT RTRN 9FT ADLT (ELECTROSURGICAL) ×1 IMPLANT
GLOVE BIO SURGEON STRL SZ7 (GLOVE) ×3 IMPLANT
GOWN STRL REUS W/ TWL LRG LVL3 (GOWN DISPOSABLE) ×2 IMPLANT
GOWN STRL REUS W/TWL LRG LVL3 (GOWN DISPOSABLE) ×4
IRRIGATION STRYKERFLOW (MISCELLANEOUS) ×1 IMPLANT
IRRIGATOR STRYKERFLOW (MISCELLANEOUS) ×3
IV NS 1000ML (IV SOLUTION) ×2
IV NS 1000ML BAXH (IV SOLUTION) ×1 IMPLANT
NEEDLE HYPO 22GX1.5 SAFETY (NEEDLE) ×3 IMPLANT
NS IRRIG 500ML POUR BTL (IV SOLUTION) ×3 IMPLANT
PACK LAP CHOLECYSTECTOMY (MISCELLANEOUS) ×3 IMPLANT
PENCIL ELECTRO HAND CTR (MISCELLANEOUS) ×3 IMPLANT
POUCH SPECIMEN RETRIEVAL 10MM (ENDOMECHANICALS) ×3 IMPLANT
RELOAD 45 VASCULAR/THIN (ENDOMECHANICALS) ×3 IMPLANT
RELOAD STAPLE TA45 3.5 REG BLU (ENDOMECHANICALS) ×3 IMPLANT
SCISSORS METZENBAUM CVD 33 (INSTRUMENTS) IMPLANT
SHEARS HARMONIC ACE PLUS 36CM (ENDOMECHANICALS) ×3 IMPLANT
SLEEVE ENDOPATH XCEL 5M (ENDOMECHANICALS) ×3 IMPLANT
SPONGE LAP 18X18 RF (DISPOSABLE) ×3 IMPLANT
SUT MNCRL AB 4-0 PS2 18 (SUTURE) ×3 IMPLANT
SUT VICRYL 0 AB UR-6 (SUTURE) ×6 IMPLANT
SYR 20ML LL LF (SYRINGE) ×3 IMPLANT
TRAY FOLEY MTR SLVR 16FR STAT (SET/KITS/TRAYS/PACK) ×3 IMPLANT
TROCAR XCEL BLUNT TIP 100MML (ENDOMECHANICALS) ×3 IMPLANT
TROCAR XCEL NON-BLD 5MMX100MML (ENDOMECHANICALS) ×6 IMPLANT
TUBING EVAC SMOKE HEATED PNEUM (TUBING) ×3 IMPLANT

## 2019-07-27 NOTE — Transfer of Care (Signed)
Immediate Anesthesia Transfer of Care Note  Patient: Joe Patterson  Procedure(s) Performed: Procedure(s): APPENDECTOMY LAPAROSCOPIC (N/A)  Patient Location: PACU  Anesthesia Type:General  Level of Consciousness: sedated  Airway & Oxygen Therapy: Patient Spontanous Breathing and Patient connected to face mask oxygen  Post-op Assessment: Report given to RN and Post -op Vital signs reviewed and stable  Post vital signs: Reviewed and stable  Last Vitals:  Vitals:   07/27/19 1311 07/27/19 1517  BP:  116/69  Pulse: 93 85  Resp:  (!) 24  Temp:  36.4 C  SpO2: 76% 16%    Complications: No apparent anesthesia complications

## 2019-07-27 NOTE — ED Provider Notes (Signed)
[facility] Emergency Department Provider Note   ____________________________________________   First MD Initiated Contact with Patient 07/27/19 [phone]     (approximate)  I have reviewed the triage vital signs and the nursing notes.   HISTORY  Chief Complaint Abdominal Pain    HPI Joe Patterson is a 43 y.o. male with no significant past medical history who presents to the ED complaining of abdominal pain.  Patient reports he has had worsening pain in the right lower quadrant of his abdomen over about the past 4 to 5 hours.  He states that area is very tender to touch and pain is worse with movement.  He has not taken anything for his symptoms.  He does state he had an inguinal hernia repair on that side a few years ago, but he has not noticed any new bulges.  He denies any fevers, chills, nausea, vomiting, or diarrhea.  He has not had any flank pain, dysuria, or hematuria.        Past Medical History:  Diagnosis Date  . Anxiety   . Eczema   . Esophageal stricture   . GERD (gastroesophageal reflux disease)   . TMJ (dislocation of temporomandibular joint)     Patient Active Problem List   Diagnosis Date Noted  . Appendicitis 07/27/2019  . Hypertriglyceridemia 11/26/2018  . Elevated glucose 11/26/2018  . Morbid obesity (HCC) 11/21/2018  . Fever blister 05/08/2018  . Medication monitoring encounter 11/20/2017  . Allergic rhinitis 11/14/2017  . Weight gain 11/14/2017  . Hyperlipidemia 11/14/2017  . Preventative health care 07/05/2017  . TMJ (temporomandibular joint syndrome) 06/22/2016  . Shoulder pain, bilateral 06/22/2016  . Cyst of skin and subcutaneous tissue 06/22/2016  . Social anxiety disorder 12/29/2015  . Allergy to perfume 12/29/2015  . History of esophageal stricture 12/29/2015  . Eczema, allergic 12/29/2015  . Gastroesophageal reflux 12/29/2015  . Non-celiac gluten sensitivity 12/29/2015    Past Surgical History:  Procedure  Laterality Date  . COLONOSCOPY, ESOPHAGOGASTRODUODENOSCOPY (EGD) AND ESOPHAGEAL DILATION    . ESOPHAGOGASTRODUODENOSCOPY (EGD) WITH PROPOFOL N/A 02/08/2019   Procedure: ESOPHAGOGASTRODUODENOSCOPY (EGD) WITH PROPOFOL;  Surgeon: Wyline MoodAnna, Kiran, MD;  [facility];  Service: Gastroenterology;  Laterality: N/A;  . HERNIA REPAIR    . TYMPANOSTOMY TUBE PLACEMENT     as a child    Prior to Admission medications   Medication Sig Start Date End Date Taking? Authorizing Provider  atorvastatin (LIPITOR) 20 MG tablet Take 1 tablet (20 mg total) by mouth at bedtime. Patient taking differently: Take 20 mg by mouth daily.  11/21/18  Yes Lada, Janit BernMelinda P, MD  betamethasone dipropionate (DIPROLENE) 0.05 % cream APPLY TOPICALLY 2 (TWO) TIMES DAILY. USE SPARINGLY CAN CAUSE STRETCH MARKS 06/23/19  Yes Poulose, Percell BeltElizabeth E, NP  cetirizine (ZYRTEC) 10 MG tablet Take 1 tablet (10 mg total) by mouth daily. 11/14/17  Yes Lada, Janit BernMelinda P, MD  montelukast (SINGULAIR) 10 MG tablet TAKE 1 TABLET BY MOUTH EVERYDAY AT BEDTIME 11/21/18  Yes Lada, Janit BernMelinda P, MD  omeprazole (PRILOSEC) 20 MG capsule Take 1 capsule (20 mg total) by mouth 2 (two) times daily before a meal. 05/29/19  Yes Wyline MoodAnna, Kiran, MD  sertraline (ZOLOFT) 50 MG tablet Take 1 tablet (50 mg total) by mouth daily. 11/21/18  Yes Lada, Janit BernMelinda P, MD  triamcinolone cream (KENALOG) 0.1 % Apply 1 application topically 2 (two) times daily. If needed (weaker strength) 11/21/18  Yes Lada, Janit BernMelinda P, MD  Turmeric 500 MG TABS Take 500 mg  by mouth daily.   Yes [provider]  valACYclovir (VALTREX) 500 MG tablet Take 1 tablet (500 mg total) by mouth 2 (two) times daily. Take twice a day for 3 days within 24 hours of symptom onset Patient taking differently: Take 500 mg by mouth 2 (two) times daily as needed.  11/21/18  Yes Lada, Satira Anis, MD    Allergies Patient has no known allergies.  Family History  Problem Relation Age of Onset  . Multiple sclerosis  Mother   . Hearing loss Father   . Cancer Maternal Grandmother        lymphoma  . Heart disease Maternal Grandfather        early 65s  . Lung disease Paternal Grandmother   . Blindness Paternal Grandmother   . Parkinson's disease Paternal Grandfather   . COPD Neg Hx   . Diabetes Neg Hx   . Hypertension Neg Hx   . Stroke Neg Hx   . Colon cancer Neg Hx     Social History Social History   Tobacco Use  . Smoking status: Former Smoker    Packs/day: 1.00    Years: 15.00    Pack years: 15.00    Types: Cigarettes    Quit date: 12/23/2007    Years since quitting: 11.6  . Smokeless tobacco: Former Systems developer    Types: Chew    Quit date: 2009  Substance Use Topics  . Alcohol use: No  . Drug use: No    Review of Systems  Constitutional: No fever/chills Eyes: No visual changes. ENT: No sore throat. Cardiovascular: Denies chest pain. Respiratory: Denies shortness of breath. Gastrointestinal: Positive for abdominal pain.  No nausea, no vomiting.  No diarrhea.  No constipation. Genitourinary: Negative for dysuria. Musculoskeletal: Negative for back pain. Skin: Negative for rash. Neurological: Negative for headaches, focal weakness or numbness.  ____________________________________________   PHYSICAL EXAM:  VITAL SIGNS: ED Triage Vitals  Enc Vitals Group     BP 07/27/19 0912 (!) 150/89     Pulse Rate 07/27/19 0912 (!) 108     Resp 07/27/19 0912 18     Temp 07/27/19 0912 100.3 F (37.9 C)     Temp Source 07/27/19 0912 Oral     SpO2 07/27/19 0912 96 %     Weight 07/27/19 0911 215 lb (97.5 kg)     Height 07/27/19 0911 5\' 11"  (1.803 m)     Head Circumference --      Peak Flow --      Pain Score 07/27/19 0911 6     Pain Loc --      Pain Edu? --      Excl. in [place]? --     Constitutional: Alert and oriented. Eyes: Conjunctivae are normal. Head: Atraumatic. Nose: No congestion/rhinnorhea. Mouth/Throat: Mucous membranes are moist. Neck: Normal ROM Cardiovascular: Normal  rate, regular rhythm. Grossly normal heart sounds. Respiratory: Normal respiratory effort.  No retractions. Lungs CTAB. Gastrointestinal: Soft, tender to palpation at right lower quadrant. No distention. Genitourinary: No inguinal hernias or testicular tenderness bilaterally. Musculoskeletal: No lower extremity tenderness nor edema. Neurologic:  Normal speech and language. No gross focal neurologic deficits are appreciated. Skin:  Skin is warm, dry and intact. No rash noted. Psychiatric: Mood and affect are normal. Speech and behavior are normal.  ____________________________________________   LABS (all labs ordered are listed, but only abnormal results are displayed)  Labs Reviewed  COMPREHENSIVE METABOLIC PANEL - Abnormal; Notable for the following components:  Result Value   Glucose, Bld 100 (*)    All other components within normal limits  CBC - Abnormal; Notable for the following components:   WBC 16.6 (*)    All other components within normal limits  URINALYSIS, COMPLETE (UACMP) WITH MICROSCOPIC - Abnormal; Notable for the following components:   Color, Urine STRAW (*)    APPearance CLEAR (*)    Specific Gravity, Urine 1.039 (*)    All other components within normal limits  SARS CORONAVIRUS 2 (HOSPITAL ORDER, PERFORMED IN [facility])  LIPASE, BLOOD  SURGICAL PATHOLOGY     PROCEDURES  Procedure(s) performed (including Critical Care):  Procedures   ____________________________________________   INITIAL IMPRESSION / ASSESSMENT AND PLAN / ED COURSE       43 year old male presenting with 4 to 5 hours of right lower quadrant abdominal pain.  Significant tenderness to light lower quadrant on exam concerning for appendicitis, will obtain CT. No appreciable inguinal hernia bilaterally.  CT positive for early acute appendicitis, will give dose of Zosyn.  Case discussed with general surgery, who plan to take patient to the OR later today.       ____________________________________________   FINAL CLINICAL IMPRESSION(S) / ED DIAGNOSES  Final diagnoses:  Acute appendicitis, unspecified acute appendicitis type  Right lower quadrant pain     ED Discharge Orders    None       Note:  This document was prepared using Dragon voice recognition software and may include unintentional dictation errors.   Chesley NoonJessup, Pina Sirianni, MD 07/27/19 [phone]

## 2019-07-27 NOTE — Anesthesia Post-op Follow-up Note (Signed)
Anesthesia QCDR form completed.        

## 2019-07-27 NOTE — ED Notes (Signed)
ED TO INPATIENT HANDOFF REPORT  ED Nurse Name and Phone #: Metta Clines [phone]  S Name/Age/Gender Joe Patterson 43 y.o. male Room/Bed: ED19A/ED19A  Code Status   Code Status: Not on file  Home/SNF/Other Home Patient oriented to: self, place, time and situation Is this baseline? Yes   Triage Complete: Triage complete  Chief Complaint pain r lower front  Triage Note Pt states RLQ abd pain x 4 hours. Denies N&V&D. Denies dysuria. A&O, ambulatory. Still has appendix.    Allergies No Known Allergies  Level of Care/Admitting Diagnosis ED Disposition    None      B Medical/Surgery History Past Medical History:  Diagnosis Date  . Anxiety   . Eczema   . Esophageal stricture   . GERD (gastroesophageal reflux disease)   . TMJ (dislocation of temporomandibular joint)    Past Surgical History:  Procedure Laterality Date  . COLONOSCOPY, ESOPHAGOGASTRODUODENOSCOPY (EGD) AND ESOPHAGEAL DILATION    . ESOPHAGOGASTRODUODENOSCOPY (EGD) WITH PROPOFOL N/A 02/08/2019   Procedure: ESOPHAGOGASTRODUODENOSCOPY (EGD) WITH PROPOFOL;  Surgeon: Jonathon Bellows, MD;  Location: [facility] ENDOSCOPY;  Service: Gastroenterology;  Laterality: N/A;  . HERNIA REPAIR    . TYMPANOSTOMY TUBE PLACEMENT     as a child     A IV Location/Drains/Wounds Patient Lines/Drains/Airways Status   Active Line/Drains/Airways    Name:   Placement date:   Placement time:   Site:   Days:   Peripheral IV 07/27/19 Right Antecubital   07/27/19    0948    Antecubital   less than 1   Airway   02/08/19    0744     169          Intake/Output Last 24 hours No intake or output data in the 24 hours ending 07/27/19 1147  Labs/Imaging Results for orders placed or performed during the hospital encounter of 07/27/19 (from the past 48 hour(s))  Lipase, blood     Status: None   Collection Time: 07/27/19  9:48 AM  Result Value Ref Range   Lipase 30 11 - 51 U/L    Comment: Performed at [facility], [address] [place], [place] [address]  Comprehensive metabolic panel     Status: Abnormal   Collection Time: 07/27/19  9:48 AM  Result Value Ref Range   Sodium 141 135 - 145 mmol/L   Potassium 3.9 3.5 - 5.1 mmol/L   Chloride 108 98 - 111 mmol/L   CO2 25 22 - 32 mmol/L   Glucose, Bld 100 (H) 70 - 99 mg/dL   BUN 9 6 - 20 mg/dL   Creatinine, Ser 0.72 0.61 - 1.24 mg/dL   Calcium 9.6 8.9 - 10.3 mg/dL   Total Protein 7.8 6.5 - 8.1 g/dL   Albumin 4.7 3.5 - 5.0 g/dL   AST 28 15 - 41 U/L   ALT 31 0 - 44 U/L   Alkaline Phosphatase 51 38 - 126 U/L   Total Bilirubin 0.6 0.3 - 1.2 mg/dL   GFR calc non Af Amer >60 >60 mL/min   GFR calc Af Amer >60 >60 mL/min   Anion gap 8 5 - 15    Comment: Performed at [facility], [address] [place], [place] [address]  CBC     Status: Abnormal   Collection Time: 07/27/19  9:48 AM  Result Value Ref Range   WBC 16.6 (H) 4.0 - 10.5 K/uL   RBC 4.85 4.22 - 5.81 MIL/uL   Hemoglobin 14.1 13.0 - 17.0  g/dL   HCT 16.142.3 09.639.0 - 04.552.0 %   MCV 87.2 80.0 - 100.0 fL   MCH 29.1 26.0 - 34.0 pg   MCHC 33.3 30.0 - 36.0 g/dL   RDW 40.912.3 81.111.5 - 91.415.5 %   Platelets 283 150 - 400 K/uL   nRBC 0.0 0.0 - 0.2 %    Comment: Performed at [facility], [address] [place], [place] [address]   Ct Abdomen Pelvis W Contrast  Result Date: 07/27/2019 CLINICAL DATA:  Right lower quadrant pain for 4 hours. EXAM: CT ABDOMEN AND PELVIS WITH CONTRAST TECHNIQUE: Multidetector CT imaging of the abdomen and pelvis was performed using the standard protocol following bolus administration of intravenous contrast. CONTRAST:  100mL OMNIPAQUE IOHEXOL 300 MG/ML  SOLN COMPARISON:  None. FINDINGS: Lower Chest: No acute findings. Hepatobiliary: Tiny sub-cm low-attenuation lesion in the anterior liver dome is too small to characterize but most likely represents a tiny cyst. No other liver lesions identified. Gallbladder is unremarkable. No evidence of biliary ductal dilatation. Pancreas:  No mass  or inflammatory changes. Spleen: Within normal limits in size and appearance. Adrenals/Urinary Tract: No masses identified. No evidence of hydronephrosis. Stomach/Bowel:  Findings of acute appendicitis as follows: Appendix: Location- Standard Diameter-8 mm Appendicolith- Absent Mucosal hyper-enhancement- Present Extraluminal Gas- Absent Periappendiceal Collection- None Vascular/Lymphatic: No pathologically enlarged lymph nodes. No abdominal aortic aneurysm. Reproductive:  No mass or other significant abnormality. Other:  None. Musculoskeletal:  No suspicious bone lesions identified. IMPRESSION: Positive for early acute appendicitis. No evidence of abscess or other complication. Electronically Signed   By: Danae OrleansJohn A Stahl M.D.   On: 07/27/2019 11:02    Pending Labs Unresulted Labs (From admission, onward)    Start     Ordered   07/27/19 1137  SARS Coronavirus 2 [facility](Hospital order, Performed in [facility] hospital lab) Nasopharyngeal Nasopharyngeal Swab  (Symptomatic/High Risk of Exposure/Tier 1 Patients Labs with Precautions)  Once,   STAT    Question Answer Comment  Is this test for diagnosis or screening Diagnosis of ill patient   Symptomatic for COVID-19 as defined by CDC Yes   Date of Symptom Onset 07/27/2019   Hospitalized for COVID-19 Yes   Admitted to ICU for COVID-19 No   Previously tested for COVID-19 No   Resident in a congregate (group) care setting No   Employed in healthcare setting No      07/27/19 1136   07/27/19 0915  Urinalysis, Complete w Microscopic  ONCE - STAT,   STAT     07/27/19 0914          Vitals/Pain Today's Vitals   07/27/19 0911 07/27/19 0912 07/27/19 1100  BP:  (!) 150/89   Pulse:  (!) 108 85  Resp:  18   Temp:  100.3 F (37.9 C)   TempSrc:  Oral   SpO2:  96% 95%  Weight: 97.5 kg    Height: 5\' 11"  (1.803 m)    PainSc: 6       Isolation Precautions Airborne and Contact precautions  Medications Medications  lactated ringers bolus 1,000 mL (has no  administration in time range)  piperacillin-tazobactam (ZOSYN) IVPB 3.375 g (has no administration in time range)  iohexol (OMNIPAQUE) 300 MG/ML solution 100 mL (100 mLs Intravenous Contrast Given 07/27/19 1026)    Mobility walks Low fall risk   Focused Assessments LRQ abd pain; acute appendicitis; VSS   R Recommendations: See Admitting Provider Note  Report given to:   Additional Notes:  R 20g ac  IV; stat covid test sent to lab.

## 2019-07-27 NOTE — Op Note (Signed)
laparascopic appendectomy   [name] Date of operation:  07/27/2019  Indications: The patient presented with a history of  abdominal pain. Workup has revealed findings consistent with acute appendicitis.  Pre-operative Diagnosis: Acute appendicitis without mention of peritonitis  Post-operative Diagnosis: Same  Surgeon: Caroleen Hamman, MD, FACS  Anesthesia: General with endotracheal tube  Findings: acute non perforated appendicitis  Estimated Blood Loss: 5cc         Specimens: appendix         Complications:  none  Procedure Details  The patient was seen again in the preop area. The options of surgery versus observation were reviewed with the patient and/or family. The risks of bleeding, infection, recurrence of symptoms, negative laparoscopy, potential for an open procedure, bowel injury, abscess or infection, were all reviewed as well. The patient was taken to Operating Room, identified as Joe Patterson and the procedure verified as laparoscopic appendectomy. A Time Out was held and the above information confirmed.  The patient was placed in the supine position and general anesthesia was induced.  Antibiotic prophylaxis was administered and VT E prophylaxis was in place. A Foley catheter was placed by the nursing staff.   The abdomen was prepped and draped in a sterile fashion. An infraumbilical incision was made. A cutdown technique was used to enter the abdominal cavity. Two vicryl stitches were placed on the fascia and a Hasson trocar inserted. Pneumoperitoneum obtained. Two 5 mm ports were placed under direct visualization.   The appendix was identified and found to be acutely inflamed . The appendix was carefully dissected. The mesoappendix was divided withHarmonic scalpel. The base of the appendix was dissected out and divided with a standard load Endo GIA.The appendix was placed in a Endo Catch bag and removed via the Hasson port. The right lower quadrant and pelvis  was then irrigated with  normal saline which was aspirated. Inspection  failed to identify any additional bleeding and there were no signs of bowel injury. Again the right lower quadrant was inspected there was no sign of bleeding or bowel injury therefore pneumoperitoneum was released, all ports were removed.  The umbilical fascia was closed with 0 Vicryl interrupted sutures and the skin incisions were approximated with subcuticular 4-0 Monocryl. Dermabond was placed The patient tolerated the procedure well, there were no complications. The sponge lap and needle count were correct at the end of the procedure.  The patient was taken to the recovery room in stable condition to be admitted for continued care.    Caroleen Hamman, MD FACS

## 2019-07-27 NOTE — Anesthesia Preprocedure Evaluation (Addendum)
Anesthesia Evaluation  Patient identified by MRN, date of birth, ID band Patient awake    Reviewed: Allergy & Precautions, H&P , NPO status , reviewed documented beta blocker date and time   Airway Mallampati: II  TM Distance: >3 FB Neck ROM: full    Dental  (+) Teeth Intact   Pulmonary former smoker,    Pulmonary exam normal        Cardiovascular Normal cardiovascular exam     Neuro/Psych PSYCHIATRIC DISORDERS Anxiety    GI/Hepatic GERD  Medicated and Controlled,  Endo/Other    Renal/GU      Musculoskeletal   Abdominal   Peds  Hematology   Anesthesia Other Findings Past Medical History: No date: Anxiety No date: Eczema No date: Esophageal stricture No date: GERD (gastroesophageal reflux disease) No date: TMJ (dislocation of temporomandibular joint) Past Surgical History: No date: COLONOSCOPY, ESOPHAGOGASTRODUODENOSCOPY (EGD) AND ESOPHAGEAL  DILATION 02/08/2019: ESOPHAGOGASTRODUODENOSCOPY (EGD) WITH PROPOFOL; N/A     Comment:  Procedure: ESOPHAGOGASTRODUODENOSCOPY (EGD) WITH               PROPOFOL;  Surgeon: Jonathon Bellows, MD;  Location: [facility]               ENDOSCOPY;  Service: Gastroenterology;  Laterality: N/A; No date: HERNIA REPAIR No date: TYMPANOSTOMY TUBE PLACEMENT     Comment:  as a child BMI    Body Mass Index: 29.99 kg/m     Reproductive/Obstetrics                            Anesthesia Physical Anesthesia Plan  ASA: II and emergent  Anesthesia Plan: General   Post-op Pain Management:    Induction: Intravenous  PONV Risk Score and Plan: 3 and Ondansetron, Treatment may vary due to age or medical condition, Midazolam and Dexamethasone  Airway Management Planned: Oral ETT  Additional Equipment:   Intra-op Plan:   Post-operative Plan: Extubation in OR  Informed Consent: I have reviewed the patients History and Physical, chart, labs and discussed the procedure  including the risks, benefits and alternatives for the proposed anesthesia with the patient or authorized representative who has indicated his/her understanding and acceptance.     Dental Advisory Given  Plan Discussed with: CRNA  Anesthesia Plan Comments:         Anesthesia Quick Evaluation

## 2019-07-27 NOTE — Progress Notes (Signed)
15 minute call to floor. 

## 2019-07-27 NOTE — ED Notes (Signed)
Pt notified OR ready for him; pt changing out of additional shirt and boxers he had on under gown; pt denies having on any jewelry/metal objects. Pt's belongings placed in bags in bed with pt. Corydon Schweiss (father) [phone]; Duward Allbritton (mother) [phone]; Estill Bamberg (sister) [phone].

## 2019-07-27 NOTE — ED Triage Notes (Signed)
Pt states RLQ abd pain x 4 hours. Denies N&V&D. Denies dysuria. A&O, ambulatory. Still has appendix.

## 2019-07-27 NOTE — H&P (Signed)
Patient ID: Joe Patterson, male   DOB: 11/23/1976, 43 y.o.   MRN: [id number]  HPI Joe Patterson is a 43 y.o. male with an acute onset of abdominal pain initially started on the periumbilical area and radiated towards the right lower quadrant where he has remained since.  He reports some decreased appetite but did had something to eat at 4 AM.  He works third shift.  He reports that the pain currently is intermittent located to the right lower quadrant sharp and moderate in intensity.  Pain gets worse when he moves.  No fevers no chills.  CT scan of the abdomen personally reviewed showing evidence of acute appendicitis without perforation or abscess.  He does have a white count of 16,000 with a left shift.  BMP is normal.  He is able to perform more than 6 METS of activity without any shortness of breath or chest pain. The prior abdominal surgery is a right inguinal hernia repair.  HPI  Past Medical History:  Diagnosis Date  . Anxiety   . Eczema   . Esophageal stricture   . GERD (gastroesophageal reflux disease)   . TMJ (dislocation of temporomandibular joint)     Past Surgical History:  Procedure Laterality Date  . COLONOSCOPY, ESOPHAGOGASTRODUODENOSCOPY (EGD) AND ESOPHAGEAL DILATION    . ESOPHAGOGASTRODUODENOSCOPY (EGD) WITH PROPOFOL N/A 02/08/2019   Procedure: ESOPHAGOGASTRODUODENOSCOPY (EGD) WITH PROPOFOL;  Surgeon: Wyline MoodAnna, Kiran, MD;  Location: [facility] ENDOSCOPY;  Service: Gastroenterology;  Laterality: N/A;  . HERNIA REPAIR    . TYMPANOSTOMY TUBE PLACEMENT     as a child    Family History  Problem Relation Age of Onset  . Multiple sclerosis Mother   . Hearing loss Father   . Cancer Maternal Grandmother        lymphoma  . Heart disease Maternal Grandfather        early 5740s  . Lung disease Paternal Grandmother   . Blindness Paternal Grandmother   . Parkinson's disease Paternal Grandfather   . COPD Neg Hx   . Diabetes Neg Hx   . Hypertension Neg Hx   . Stroke Neg Hx   . Colon  cancer Neg Hx     Social History Social History   Tobacco Use  . Smoking status: Former Smoker    Packs/day: 1.00    Years: 15.00    Pack years: 15.00    Types: Cigarettes    Quit date: 12/23/2007    Years since quitting: 11.6  . Smokeless tobacco: Former NeurosurgeonUser    Types: Chew    Quit date: 2009  Substance Use Topics  . Alcohol use: No  . Drug use: No    No Known Allergies  Current Facility-Administered Medications  Medication Dose Route Frequency Provider Last Rate Last Dose  . piperacillin-tazobactam (ZOSYN) IVPB 3.375 g  3.375 g Intravenous Once Chesley NoonJessup, Charles, MD       Current Outpatient Medications  Medication Sig Dispense Refill  . atorvastatin (LIPITOR) 20 MG tablet Take 1 tablet (20 mg total) by mouth at bedtime. 30 tablet 5  . betamethasone dipropionate (DIPROLENE) 0.05 % cream APPLY TOPICALLY 2 (TWO) TIMES DAILY. USE SPARINGLY CAN CAUSE STRETCH MARKS 45 g 1  . cetirizine (ZYRTEC) 10 MG tablet Take 1 tablet (10 mg total) by mouth daily. 90 tablet 3  . montelukast (SINGULAIR) 10 MG tablet TAKE 1 TABLET BY MOUTH EVERYDAY AT BEDTIME 30 tablet 11  . omeprazole (PRILOSEC) 20 MG capsule Take 1 capsule (20 mg total) by  mouth 2 (two) times daily before a meal. 90 capsule 3  . sertraline (ZOLOFT) 50 MG tablet Take 1 tablet (50 mg total) by mouth daily. 30 tablet 11  . triamcinolone cream (KENALOG) 0.1 % Apply 1 application topically 2 (two) times daily. If needed (weaker strength) 453.6 g 3  . valACYclovir (VALTREX) 500 MG tablet Take 1 tablet (500 mg total) by mouth 2 (two) times daily. Take twice a day for 3 days within 24 hours of symptom onset 6 tablet 3     Review of Systems Full ROS  was asked and was negative except for the information on the HPI  Physical Exam Blood pressure 125/78, pulse 86, temperature 100.3 F (37.9 C), temperature source Oral, resp. rate 18, height 5\' 11"  (1.803 m), weight 97.5 kg, SpO2 97 %. CONSTITUTIONAL: NAD EYES: Pupils are equal, round,  and reactive to light, Sclera are non-icteric. EARS, NOSE, MOUTH AND THROAT: The oropharynx is clear. The oral mucosa is pink and moist. Hearing is intact to voice. LYMPH NODES:  Lymph nodes in the neck are normal. RESPIRATORY:  Lungs are clear. There is normal respiratory effort, with equal breath sounds bilaterally, and without pathologic use of accessory muscles. CARDIOVASCULAR: Heart is regular without murmurs, gallops, or rubs. GI: The abdomen is soft,  Non distended, TTP McBurneys point w focal peritonitis.. There are no palpable masses. There is no hepatosplenomegaly. There are normal bowel sounds in all quadrants. GU: Rectal deferred.   MUSCULOSKELETAL: Normal muscle strength and tone. No cyanosis or edema.   SKIN: Turgor is good and there are no pathologic skin lesions or ulcers. NEUROLOGIC: Motor and sensation is grossly normal. Cranial nerves are grossly intact. PSYCH:  Oriented to person, place and time. Affect is normal.  Data Reviewed  I have personally reviewed the patient's imaging, laboratory findings and medical records.    Assessment/Plan 43 year old male with right recurrent pain and classic signs and symptoms consistent with acute appendicitis and confirmed by CT.  Is with the patient detail about his disease process.  I do recommend laparoscopic appendectomy.  We will test him for COVID-19 and add him to the schedule today. The risks, benefits, complications, treatment options, and expected outcomes were discussed with the patient. The treatment of antibiotics alone was discussed .  Also discussed continuing to the operating room for Laparoscopic Appendectomy.  The possibilities of  bleeding, recurrent infection, perforation of viscus, finding a normal appendix, the need for additional procedures, failure to diagnose a condition, conversion to open procedure and creating a complication requiring transfusion or further operations were discussed. The patient was given the  opportunity to ask questions and have them answered.  Patient would like to proceed with Laparoscopic Appendectomy and consent was obtained.   Caroleen Hamman, MD FACS General Surgeon 07/27/2019, 12:01 PM

## 2019-07-27 NOTE — ED Notes (Signed)
OR called this RN to notify that orderly is on way to transport pt to surgery.

## 2019-07-27 NOTE — ED Notes (Signed)
Pt given more warm blankets.  

## 2019-07-27 NOTE — ED Notes (Signed)
Pt resting calmly in bed. Family at bedside. Rails up. Bed locked low. Call bell within reach. Surgery staff at bedside.

## 2019-07-27 NOTE — Anesthesia Procedure Notes (Signed)
Procedure Name: Intubation Date/Time: 07/27/2019 2:15 PM Performed by: Doreen Salvage, CRNA Pre-anesthesia Checklist: Patient identified, Patient being monitored, Timeout performed, Emergency Drugs available and Suction available Patient Re-evaluated:Patient Re-evaluated prior to induction Oxygen Delivery Method: Circle system utilized Preoxygenation: Pre-oxygenation with 100% oxygen Induction Type: IV induction Ventilation: Mask ventilation without difficulty Laryngoscope Size: Mac and 3 Grade View: Grade I Tube type: Oral Tube size: 7.5 mm Number of attempts: 1 Airway Equipment and Method: Stylet Placement Confirmation: ETT inserted through vocal cords under direct vision,  positive ETCO2 and breath sounds checked- equal and bilateral Secured at: 23 cm Tube secured with: Tape Dental Injury: Teeth and Oropharynx as per pre-operative assessment

## 2019-07-28 ENCOUNTER — Encounter: Payer: Self-pay | Admitting: Surgery

## 2019-07-28 NOTE — Anesthesia Postprocedure Evaluation (Signed)
Anesthesia Post Note  Patient: Joe Patterson  Procedure(s) Performed: APPENDECTOMY LAPAROSCOPIC (N/A )  Patient location during evaluation: PACU Anesthesia Type: General Level of consciousness: awake and alert Pain management: pain level controlled Vital Signs Assessment: post-procedure vital signs reviewed and stable Respiratory status: spontaneous breathing, nonlabored ventilation and respiratory function stable Cardiovascular status: blood pressure returned to baseline and stable Postop Assessment: no apparent nausea or vomiting Anesthetic complications: no     Last Vitals:  Vitals:   07/27/19 1955 07/28/19 0522  BP: 115/66 118/68  Pulse: 77 80  Resp: 16 16  Temp: 36.8 C 36.8 C  SpO2: 94% 96%    Last Pain:  Vitals:   07/28/19 0522  TempSrc: Oral  PainSc:                  Alphonsus Sias

## 2019-07-28 NOTE — Progress Notes (Signed)
Discharge order received. Patient mental status is at baseline. Vital signs stable . No signs of acute distress. Discharge instructions given. Patient verbalized understanding. No other issues noted at this time.   

## 2019-07-28 NOTE — Discharge Summary (Signed)
Patient ID: Joe Patterson W Bhatnagar MRN: [id number] DOB/AGE: 42/08/1976 42 y.o.  Admit date: 07/27/2019 Discharge date: 07/28/2019   Discharge Diagnoses:  Active Problems:   Appendicitis   Procedures:Lap Appendectomy  Hospital Course: 43 yo maleadmitted with findings consistent with acute appendicitis and  was taken promptly to the operating room for an uneventful laparoscopic appendectomy.  Patient was kept overnight.  The time of discharge the patient was ambulating,  pain was controlled.  His vital signs were stable and she was afebrile.   physical exam at discharge showed a pt  in no acute distress.  Awake and alert.  Abdomen: Soft incisions healing well without infection or peritonitis.  Extremities well-perfused and no edema.  Condition of the patient the time of discharge was stable    Disposition: Discharge disposition: 01-Home or Self Care       Discharge Instructions    Call MD for:  difficulty breathing, headache or visual disturbances   Complete by: As directed    Call MD for:  extreme fatigue   Complete by: As directed    Call MD for:  hives   Complete by: As directed    Call MD for:  persistant dizziness or light-headedness   Complete by: As directed    Call MD for:  persistant nausea and vomiting   Complete by: As directed    Call MD for:  redness, tenderness, or signs of infection (pain, swelling, redness, odor or green/yellow discharge around incision site)   Complete by: As directed    Call MD for:  severe uncontrolled pain   Complete by: As directed    Call MD for:  temperature >100.4   Complete by: As directed    Diet - low sodium heart healthy   Complete by: As directed    Discharge instructions   Complete by: As directed    Shower in am   Increase activity slowly   Complete by: As directed    Lifting restrictions   Complete by: As directed    20 lbs x 6 wks   Other Restrictions   Complete by: As directed    No face to face contact w Juveniles for 6  weeks from 07/27/19, May return to work on light Duty 08/05/19.     Allergies as of 07/28/2019   No Known Allergies     Medication List    TAKE these medications   atorvastatin 20 MG tablet Commonly known as: LIPITOR Take 1 tablet (20 mg total) by mouth at bedtime. What changed: when to take this Notes to patient: resume   betamethasone dipropionate 0.05 % cream Commonly known as: DIPROLENE APPLY TOPICALLY 2 (TWO) TIMES DAILY. USE SPARINGLY CAN CAUSE STRETCH MARKS Notes to patient: resume   cetirizine 10 MG tablet Commonly known as: ZYRTEC Take 1 tablet (10 mg total) by mouth daily. Notes to patient: resume   montelukast 10 MG tablet Commonly known as: SINGULAIR TAKE 1 TABLET BY MOUTH EVERYDAY AT BEDTIME Notes to patient: resume   omeprazole 20 MG capsule Commonly known as: PRILOSEC Take 1 capsule (20 mg total) by mouth 2 (two) times daily before a meal.   sertraline 50 MG tablet Commonly known as: ZOLOFT Take 1 tablet (50 mg total) by mouth daily. Notes to patient: resume at bedtime   triamcinolone cream 0.1 % Commonly known as: KENALOG Apply 1 application topically 2 (two) times daily. If needed (weaker strength)   Turmeric 500 MG Tabs Take 500 mg by mouth daily.  valACYclovir 500 MG tablet Commonly known as: VALTREX Take 1 tablet (500 mg total) by mouth 2 (two) times daily. Take twice a day for 3 days within 24 hours of symptom onset What changed:   when to take this  reasons to take this  additional instructions      Follow-up Information    Tylene Fantasia, PA-C Follow up in 2 week(s).   Specialty: Physician Assistant Contact information: [place] [address] [phone]            Caroleen Hamman, MD FACS

## 2019-07-30 LAB — SURGICAL PATHOLOGY

## 2019-08-16 NOTE — Telephone Encounter (Signed)
I spoke with patient today & he stated he was going to have his new pcp look at the procedure report Dr Vicente Males completed. He doesn't feel he needs a specialist but he will get her opinion.

## 2019-08-23 ENCOUNTER — Other Ambulatory Visit: Payer: Self-pay | Admitting: Family Medicine

## 2019-09-11 ENCOUNTER — Ambulatory Visit: Payer: BC Managed Care – PPO | Admitting: Family Medicine

## 2019-09-12 ENCOUNTER — Ambulatory Visit: Payer: BC Managed Care – PPO | Admitting: Family Medicine

## 2019-09-12 NOTE — Telephone Encounter (Signed)
Give him a 30-day supply.  He was supposed to follow-up after his last visit.  Offer him a video visit or telephone visit or office visit.

## 2019-09-17 ENCOUNTER — Encounter: Payer: Self-pay | Admitting: Family Medicine

## 2019-09-17 ENCOUNTER — Other Ambulatory Visit: Payer: Self-pay

## 2019-09-17 ENCOUNTER — Ambulatory Visit (INDEPENDENT_AMBULATORY_CARE_PROVIDER_SITE_OTHER): Payer: BC Managed Care – PPO | Admitting: Family Medicine

## 2019-09-17 VITALS — BP 116/78 | HR 83 | Temp 97.9°F | Resp 14 | Ht 70.5 in | Wt 214.3 lb

## 2019-09-17 DIAGNOSIS — F4010 Social phobia, unspecified: Secondary | ICD-10-CM

## 2019-09-17 DIAGNOSIS — B001 Herpesviral vesicular dermatitis: Secondary | ICD-10-CM | POA: Diagnosis not present

## 2019-09-17 DIAGNOSIS — L239 Allergic contact dermatitis, unspecified cause: Secondary | ICD-10-CM

## 2019-09-17 DIAGNOSIS — J301 Allergic rhinitis due to pollen: Secondary | ICD-10-CM

## 2019-09-17 DIAGNOSIS — E782 Mixed hyperlipidemia: Secondary | ICD-10-CM | POA: Diagnosis not present

## 2019-09-17 DIAGNOSIS — K219 Gastro-esophageal reflux disease without esophagitis: Secondary | ICD-10-CM

## 2019-09-17 DIAGNOSIS — E781 Pure hyperglyceridemia: Secondary | ICD-10-CM

## 2019-09-17 MED ORDER — BETAMETHASONE DIPROPIONATE 0.05 % EX CREA: TOPICAL_CREAM | Freq: Two times a day (BID) | CUTANEOUS | 1 refills | Status: DC

## 2019-09-17 MED ORDER — ATORVASTATIN CALCIUM 20 MG PO TABS: 20.0000 mg | ORAL_TABLET | Freq: Every day | ORAL | 5 refills | Status: DC

## 2019-09-17 MED ORDER — CETIRIZINE HCL 10 MG PO TABS: 10.0000 mg | ORAL_TABLET | Freq: Every day | ORAL | 3 refills | Status: DC

## 2019-09-17 MED ORDER — OMEPRAZOLE 20 MG PO CPDR: 20.0000 mg | DELAYED_RELEASE_CAPSULE | Freq: Two times a day (BID) | ORAL | 3 refills | Status: DC

## 2019-09-17 MED ORDER — SERTRALINE HCL 50 MG PO TABS: 50.0000 mg | ORAL_TABLET | Freq: Every day | ORAL | 11 refills | Status: DC

## 2019-09-17 MED ORDER — TRIAMCINOLONE ACETONIDE 0.1 % EX CREA: 1.0000 "application " | TOPICAL_CREAM | Freq: Two times a day (BID) | CUTANEOUS | 3 refills | Status: DC

## 2019-09-17 MED ORDER — MONTELUKAST SODIUM 10 MG PO TABS: ORAL_TABLET | ORAL | 11 refills | Status: DC

## 2019-09-17 MED ORDER — VALACYCLOVIR HCL 500 MG PO TABS: ORAL_TABLET | ORAL | 3 refills | Status: DC

## 2019-09-17 NOTE — Progress Notes (Signed)
Name: Joe Patterson   MRN: [id number]    DOB: [birth year]   Date:09/17/2019       Progress Note  Chief Complaint  Patient presents with  . Follow-up  . Medication Refill     Subjective:   Joe Patterson is a 43 y.o. male, presents to clinic for routine follow up on the conditions listed above.  Hyperlipidemia:  Current Medication Regimen:  lipitor 20 mg daily Last Lipids: Lab Results  Component Value Date   CHOL 120 02/05/2019   HDL 38 (L) 02/05/2019   LDLCALC 65 02/05/2019   TRIG 83 02/05/2019   CHOLHDL 3.2 02/05/2019   - Current Diet:   Trying to avoid fast food - Denies: Chest pain, shortness of breath, myalgias. - Documented aortic atherosclerosis? No - Risk factors for atherosclerosis: hypercholesterolemia  Anxiety: Pt states zoloft 50 mg works well for him, he has tried to get off the meds before, but did not do well without or with lower doses.  Denies any depressed mood, SI, HI, AVH.  Request 30 d supply refills  Fever blisters: Pt has valtrex 50 mg BID PRN for outbreaks of fever blisters, says thankfully it is infrequent.  Last was over a year ago when he was ill, always gets when he's ill.  Refills sent in   Eczema & allergic rhinitis: Pts allergies are mostly nasal congestion and discharge, he takes zytec and singulair.  Also has eczema to elbows and lower legs that does flare up worse in the winter and drier weather.  Slight rash right now to LE.  He has two different prescriptions steroid ointments that he requests refills on.   GERD:   Improving GERD.  Did see Dr. Tobi Bastos earlier in the year.  He works 3rd shift, was having worse sx and was drinking a lot of coffee.  HE has since cut out coffee and is using a different caffeine supplement instead.  It seems to have helped his GERD sx.  Dr. Tobi Bastos mentioned his stomach had more than normal food content - possibly delayed gastric emptying?  Pt denies bloating, early siatey  Using omeprazole BID and feels sx are  controlled.   Had appendectomy in August, recovered well   Patient Active Problem List   Diagnosis Date Noted  . Appendicitis 07/27/2019  . Hypertriglyceridemia 11/26/2018  . Elevated glucose 11/26/2018  . Morbid obesity (HCC) 11/21/2018  . Fever blister 05/08/2018  . Medication monitoring encounter 11/20/2017  . Allergic rhinitis 11/14/2017  . Weight gain 11/14/2017  . Hyperlipidemia 11/14/2017  . Preventative health care 07/05/2017  . TMJ (temporomandibular joint syndrome) 06/22/2016  . Shoulder pain, bilateral 06/22/2016  . Cyst of skin and subcutaneous tissue 06/22/2016  . Social anxiety disorder 12/29/2015  . Allergy to perfume 12/29/2015  . History of esophageal stricture 12/29/2015  . Eczema, allergic 12/29/2015  . Gastroesophageal reflux 12/29/2015  . Non-celiac gluten sensitivity 12/29/2015    Past Surgical History:  Procedure Laterality Date  . APPENDECTOMY  2020  . COLONOSCOPY, ESOPHAGOGASTRODUODENOSCOPY (EGD) AND ESOPHAGEAL DILATION    . ESOPHAGOGASTRODUODENOSCOPY (EGD) WITH PROPOFOL N/A 02/08/2019   Procedure: ESOPHAGOGASTRODUODENOSCOPY (EGD) WITH PROPOFOL;  Surgeon: Wyline Mood, MD;  Location: [facility] ENDOSCOPY;  Service: Gastroenterology;  Laterality: N/A;  . HERNIA REPAIR    . LAPAROSCOPIC APPENDECTOMY N/A 07/27/2019   Procedure: APPENDECTOMY LAPAROSCOPIC;  Surgeon: Leafy Ro, MD;  Location: [facility] ORS;  Service: General;  Laterality: N/A;  . TYMPANOSTOMY TUBE PLACEMENT     as a child  Family History  Problem Relation Age of Onset  . Multiple sclerosis Mother   . Hearing loss Father   . Cancer Maternal Grandmother        lymphoma  . Heart disease Maternal Grandfather        early 66s  . Lung disease Paternal Grandmother   . Blindness Paternal Grandmother   . Parkinson's disease Paternal Grandfather   . COPD Neg Hx   . Diabetes Neg Hx   . Hypertension Neg Hx   . Stroke Neg Hx   . Colon cancer Neg Hx     Social History   Socioeconomic  History  . Marital status: Single    Spouse name: Not on file  . Number of children: Not on file  . Years of education: 45  . Highest education level: Bachelor's degree (e.g., BA, AB, BS)  Occupational History  . Occupation: [employer]  Social Needs  . Financial resource strain: Not hard at all  . Food insecurity    Worry: Never true    Inability: Never true  . Transportation needs    Medical: No    Non-medical: No  Tobacco Use  . Smoking status: Former Smoker    Packs/day: 1.00    Years: 15.00    Pack years: 15.00    Types: Cigarettes    Quit date: 12/23/2007    Years since quitting: 11.7  . Smokeless tobacco: Former Neurosurgeon    Types: Chew    Quit date: 2009  Substance and Sexual Activity  . Alcohol use: No  . Drug use: No  . Sexual activity: Yes  Lifestyle  . Physical activity    Days per week: 2 days    Minutes per session: 30 min  . Stress: Not at all  Relationships  . Social Musician on phone: Three times a week    Gets together: Three times a week    Attends religious service: Never    Active member of club or organization: No    Attends meetings of clubs or organizations: Never    Relationship status: Not on file  . Intimate partner violence    Fear of current or ex partner: Not on file    Emotionally abused: Not on file    Physically abused: Not on file    Forced sexual activity: Not on file  Other Topics Concern  . Not on file  Social History Narrative  . Not on file     Current Outpatient Medications:  .  atorvastatin (LIPITOR) 20 MG tablet, Take 1 tablet (20 mg total) by mouth at bedtime. (Patient taking differently: Take 20 mg by mouth daily. ), Disp: 30 tablet, Rfl: 5 .  betamethasone dipropionate (DIPROLENE) 0.05 % cream, APPLY TOPICALLY 2 (TWO) TIMES DAILY. USE SPARINGLY CAN CAUSE STRETCH MARKS, Disp: 45 g, Rfl: 1 .  cetirizine (ZYRTEC) 10 MG tablet, Take 1 tablet (10 mg total) by mouth daily., Disp: 90 tablet, Rfl: 3 .  montelukast  (SINGULAIR) 10 MG tablet, TAKE 1 TABLET BY MOUTH EVERYDAY AT BEDTIME, Disp: 30 tablet, Rfl: 11 .  omeprazole (PRILOSEC) 20 MG capsule, Take 1 capsule (20 mg total) by mouth 2 (two) times daily before a meal., Disp: 90 capsule, Rfl: 3 .  sertraline (ZOLOFT) 50 MG tablet, Take 1 tablet (50 mg total) by mouth daily., Disp: 30 tablet, Rfl: 11 .  triamcinolone cream (KENALOG) 0.1 %, Apply 1 application topically 2 (two) times daily. If needed (weaker strength),  Disp: 453.6 g, Rfl: 3 .  Turmeric 500 MG TABS, Take 500 mg by mouth daily., Disp: , Rfl:  .  valACYclovir (VALTREX) 500 MG tablet, Take 1 tablet (500 mg total) by mouth 2 (two) times daily. Take twice a day for 3 days within 24 hours of symptom onset (Patient taking differently: Take 500 mg by mouth 2 (two) times daily as needed. ), Disp: 6 tablet, Rfl: 3  No Known Allergies  I personally reviewed active problem list, medication list, allergies, family history, social history, health maintenance, notes from last encounter, lab results with the patient/caregiver today.  Review of Systems  Constitutional: Negative.   HENT: Negative.   Eyes: Negative.   Respiratory: Negative.   Cardiovascular: Negative.   Gastrointestinal: Negative.   Endocrine: Negative.   Genitourinary: Negative.   Musculoskeletal: Negative.   Skin: Negative.   Allergic/Immunologic: Negative.   Neurological: Negative.   Hematological: Negative.   Psychiatric/Behavioral: Negative.   All other systems reviewed and are negative.    Objective:    Vitals:   09/17/19 0810  BP: 116/78  Pulse: 83  Resp: 14  Temp: 97.9 F (36.6 C)  SpO2: 99%  Weight: 214 lb 4.8 oz (97.2 kg)  Height: 5' 10.5" (1.791 m)    Body mass index is 30.31 kg/m.  Physical Exam Vitals signs and nursing note reviewed.  Constitutional:      General: He is not in acute distress.    Appearance: Normal appearance. He is well-developed. He is not ill-appearing, toxic-appearing or  diaphoretic.     Interventions: Face mask in place.  HENT:     Head: Normocephalic and atraumatic.     Jaw: No trismus.     Right Ear: Tympanic membrane, ear canal and external ear normal.     Left Ear: Tympanic membrane, ear canal and external ear normal.     Nose: Congestion and rhinorrhea present.     Mouth/Throat:     Mouth: Mucous membranes are moist.     Pharynx: Oropharynx is clear.  Eyes:     General: Lids are normal. No scleral icterus.    Conjunctiva/sclera: Conjunctivae normal.     Pupils: Pupils are equal, round, and reactive to light.  Neck:     Musculoskeletal: Normal range of motion and neck supple.     Trachea: Trachea and phonation normal. No tracheal deviation.  Cardiovascular:     Rate and Rhythm: Normal rate and regular rhythm.     Pulses: Normal pulses.          Radial pulses are 2+ on the right side and 2+ on the left side.       Posterior tibial pulses are 2+ on the right side and 2+ on the left side.     Heart sounds: Normal heart sounds. No murmur. No friction rub. No gallop.   Pulmonary:     Effort: Pulmonary effort is normal. No respiratory distress.     Breath sounds: Normal breath sounds. No stridor. No wheezing, rhonchi or rales.  Abdominal:     General: Bowel sounds are normal. There is no distension.     Palpations: Abdomen is soft.     Tenderness: There is no abdominal tenderness. There is no guarding or rebound.  Musculoskeletal: Normal range of motion.     Right lower leg: No edema.     Left lower leg: No edema.  Skin:    General: Skin is warm and dry.     Capillary Refill: Capillary  refill takes less than 2 seconds.     Coloration: Skin is not jaundiced.     Findings: Rash (erythematous macular rash to anterior knees b/l) present.     Nails: There is no clubbing.   Neurological:     Mental Status: He is alert.     Cranial Nerves: No dysarthria or facial asymmetry.     Motor: No tremor or abnormal muscle tone.     Gait: Gait normal.   Psychiatric:        Mood and Affect: Mood normal.        Speech: Speech normal.        Behavior: Behavior normal. Behavior is cooperative.      Recent Results (from the past 2160 hour(s))  Lipase, blood     Status: None   Collection Time: 07/27/19  9:48 AM  Result Value Ref Range   Lipase 30 11 - 51 U/L    Comment: Performed at [facility], [address] [place], [place] [address]  Comprehensive metabolic panel     Status: Abnormal   Collection Time: 07/27/19  9:48 AM  Result Value Ref Range   Sodium 141 135 - 145 mmol/L   Potassium 3.9 3.5 - 5.1 mmol/L   Chloride 108 98 - 111 mmol/L   CO2 25 22 - 32 mmol/L   Glucose, Bld 100 (H) 70 - 99 mg/dL   BUN 9 6 - 20 mg/dL   Creatinine, Ser 6.04 0.61 - 1.24 mg/dL   Calcium 9.6 8.9 - 54.0 mg/dL   Total Protein 7.8 6.5 - 8.1 g/dL   Albumin 4.7 3.5 - 5.0 g/dL   AST 28 15 - 41 U/L   ALT 31 0 - 44 U/L   Alkaline Phosphatase 51 38 - 126 U/L   Total Bilirubin 0.6 0.3 - 1.2 mg/dL   GFR calc non Af Amer >60 >60 mL/min   GFR calc Af Amer >60 >60 mL/min   Anion gap 8 5 - 15    Comment: Performed at [facility], [address] [place], [place] [address]  CBC     Status: Abnormal   Collection Time: 07/27/19  9:48 AM  Result Value Ref Range   WBC 16.6 (H) 4.0 - 10.5 K/uL   RBC 4.85 4.22 - 5.81 MIL/uL   Hemoglobin 14.1 13.0 - 17.0 g/dL   HCT 14.7 82.9 - 56.2 %   MCV 87.2 80.0 - 100.0 fL   MCH 29.1 26.0 - 34.0 pg   MCHC 33.3 30.0 - 36.0 g/dL   RDW 13.0 86.5 - 78.4 %   Platelets 283 150 - 400 K/uL   nRBC 0.0 0.0 - 0.2 %    Comment: Performed at [facility], [address] [place], [place] [address]  Urinalysis, Complete w Microscopic     Status: Abnormal   Collection Time: 07/27/19 11:41 AM  Result Value Ref Range   Color, Urine STRAW (A) YELLOW   APPearance CLEAR (A) CLEAR   Specific Gravity, Urine 1.039 (H) 1.005 - 1.030   pH 7.0 5.0 - 8.0   Glucose, UA NEGATIVE NEGATIVE mg/dL   Hgb urine dipstick  NEGATIVE NEGATIVE   Bilirubin Urine NEGATIVE NEGATIVE   Ketones, ur NEGATIVE NEGATIVE mg/dL   Protein, ur NEGATIVE NEGATIVE mg/dL   Nitrite NEGATIVE NEGATIVE   Leukocytes,Ua NEGATIVE NEGATIVE   RBC / HPF 0-5 0 - 5 RBC/hpf   WBC, UA NONE SEEN 0 - 5 WBC/hpf   Bacteria, UA NONE  SEEN NONE SEEN   Squamous Epithelial / LPF NONE SEEN 0 - 5    Comment: Performed at [facility], [address] [place], [place] [address]  SARS Coronavirus 2 [facility](Hospital order, Performed in [facility] hospital lab) Nasopharyngeal Nasopharyngeal Swab     Status: None   Collection Time: 07/27/19 11:41 AM   Specimen: Nasopharyngeal Swab  Result Value Ref Range   SARS Coronavirus 2 NEGATIVE NEGATIVE    Comment: (NOTE) If result is NEGATIVE SARS-CoV-2 target nucleic acids are NOT DETECTED. The SARS-CoV-2 RNA is generally detectable in upper and lower  respiratory specimens during the acute phase of infection. The lowest  concentration of SARS-CoV-2 viral copies this assay can detect is 250  copies / mL. A negative result does not preclude SARS-CoV-2 infection  and should not be used as the sole basis for treatment or other  patient management decisions.  A negative result may occur with  improper specimen collection / handling, submission of specimen other  than nasopharyngeal swab, presence of viral mutation(s) within the  areas targeted by this assay, and inadequate number of viral copies  (<250 copies / mL). A negative result must be combined with clinical  observations, patient history, and epidemiological information. If result is POSITIVE SARS-CoV-2 target nucleic acids are DETECTED. The SARS-CoV-2 RNA is generally detectable in upper and lower  respiratory specimens dur ing the acute phase of infection.  Positive  results are indicative of active infection with SARS-CoV-2.  Clinical  correlation with patient history and other diagnostic information is  necessary to determine patient infection  status.  Positive results do  not rule out bacterial infection or co-infection with other viruses. If result is PRESUMPTIVE POSTIVE SARS-CoV-2 nucleic acids MAY BE PRESENT.   A presumptive positive result was obtained on the submitted specimen  and confirmed on repeat testing.  While 2019 novel coronavirus  (SARS-CoV-2) nucleic acids may be present in the submitted sample  additional confirmatory testing may be necessary for epidemiological  and / or clinical management purposes  to differentiate between  SARS-CoV-2 and other Sarbecovirus currently known to infect humans.  If clinically indicated additional testing with an alternate test  methodology [phone]) is advised. The SARS-CoV-2 RNA is generally  detectable in upper and lower respiratory sp ecimens during the acute  phase of infection. The expected result is Negative. Fact Sheet for Patients:  BoilerBrush.com.cyhttps://www.fda.gov/media/136312/download Fact Sheet for Healthcare Providers: https://pope.com/https://www.fda.gov/media/136313/download This test is not yet approved or cleared by the [place] FDA and has been authorized for detection and/or diagnosis of SARS-CoV-2 by FDA under an Emergency Use Authorization (EUA).  This EUA will remain in effect (meaning this test can be used) for the duration of the COVID-19 declaration under Section 564(b)(1) of the Act, 21 U.S.C. section 360bbb-3(b)(1), unless the authorization is terminated or revoked sooner. Performed at [facility], [address] [place], [place] [address]   Surgical pathology     Status: None   Collection Time: 07/27/19  2:48 PM  Result Value Ref Range   SURGICAL PATHOLOGY      Surgical Pathology CASE: [id number] PATIENT: Mable FillSCOTT No Surgical Pathology Report     SPECIMEN SUBMITTED: A. Appendix  CLINICAL HISTORY: None provided  PRE-OPERATIVE DIAGNOSIS: Appendicitis  POST-OPERATIVE DIAGNOSIS: Same as pre-op    DIAGNOSIS: A. APPENDIX; APPENDECTOMY:  - ACUTE APPENDICITIS WITH SEROSITIS. - NEGATIVE FOR ATYPIA AND MALIGNANCY.  GROSS DESCRIPTION: A. Labeled: appendix Received: In formalin Integrity: Intact appendix with stapled surgical margin Size: 5.5  cm in length and 0.8 cm in diameter Additional Tissue: not present Serosa: Irritated slightly eroded and partially covered by green/gray fibrinopurulent exudate Lumen: 0.1 cm filled with slightly hemorrhagic mucosa Perforation: Not present Wall: 0.4 cm in thickness Summary of Sections: A1 - Bisected tip and surgical margin A2 - Representative sections  Final Diagnosis performed by Katherine Mantle, MD.   Electronically signed 07/30/2019 11:22:19AM The electronic signature i ndicates that the named Attending Pathologist has evaluated the specimen  Technical component performed at [facility], [address] [place], [place] [address] Lab: [phone] Dir: Jolene Schimke, MD, MMM  Professional component performed at [facility], [facility], [address] [place], [place], [place] [address] Lab: [phone] Dir: Georgiann Cocker. Rubinas, MD      PHQ2/9: Depression screen [facility] 2/9 09/17/2019 02/05/2019 11/21/2018 05/08/2018 01/04/2018  Decreased Interest 0 0 0 0 0  Down, Depressed, Hopeless 0 0 0 0 0  PHQ - 2 Score 0 0 0 0 0  Altered sleeping 0 0 0 0 -  Tired, decreased energy 0 0 0 0 -  Change in appetite 0 0 0 1 -  Feeling bad or failure about yourself  0 0 0 0 -  Trouble concentrating 0 0 0 0 -  Moving slowly or fidgety/restless 0 0 0 0 -  Suicidal thoughts 0 0 0 0 -  PHQ-9 Score 0 0 0 1 -  Difficult doing work/chores Not difficult at all - Not difficult at all Not difficult at all -    phq 9 is negative reviewed  Fall Risk: Fall Risk  09/17/2019 02/05/2019 11/21/2018 01/04/2018 11/14/2017  Falls in the past year? 0 0 0 No No  Number falls in past yr: 0 0 - - -  Injury with Fall? 0 0 - - -  Follow up - Falls evaluation completed - - -      Functional Status Survey: Does the  patient have difficulty seeing, even when wearing glasses/contacts?: No Does the patient have difficulty concentrating, remembering, or making decisions?: No Does the patient have difficulty walking or climbing stairs?: No Does the patient have difficulty dressing or bathing?: No Does the patient have difficulty doing errands alone such as visiting a doctor's office or shopping?: No    Assessment & Plan:   1. Mixed hyperlipidemia Has been well controlled, compliant with meds no concerning side effects, recheck labs - atorvastatin (LIPITOR) 20 MG tablet; Take 1 tablet (20 mg total) by mouth at bedtime.  Dispense: 30 tablet; Refill: 5 - COMPLETE METABOLIC PANEL WITH GFR - Lipid panel  2. Fever blister Patient gets infrequently but requests refill, last was over a year ago - valACYclovir (VALTREX) 500 MG tablet; At symptom onset take one tablet PO BID for 5 days  Dispense: 10 tablet; Refill: 3  3. Seasonal allergic rhinitis due to pollen Does have severe seasonal allergies causing lot of coughing when outdoors certain seasons, start over-the-counter medications including Zyrtec, Claritin, Allegra or Xyzal and a steroid nasal spray and can add Singulair if needed for control - montelukast (SINGULAIR) 10 MG tablet; TAKE 1 TABLET BY MOUTH EVERYDAY AT BEDTIME  Dispense: 30 tablet; Refill: 11  4. Gastroesophageal reflux disease, unspecified whether esophagitis present Well controlled with Prilosec, refills given - omeprazole (PRILOSEC) 20 MG capsule; Take 1 capsule (20 mg total) by mouth 2 (two) times daily before a meal.  Dispense: 90 capsule; Refill: 3  5. Social anxiety disorder Well-controlled with Zoloft, no concerning side effects, unable to decrease meds due  to side effects and worsening symptoms, refill sent - sertraline (ZOLOFT) 50 MG tablet; Take 1 tablet (50 mg total) by mouth daily.  Dispense: 30 tablet; Refill: 11  6. Hypertriglyceridemia - COMPLETE METABOLIC PANEL WITH GFR -  Lipid panel  7. Eczema, allergic Patches of rash and is anterior lower legs and arms, patient uses 2 different strength as needed, understand sparingly use and side effects of long-term use of topical steroids, help control of seasonal allergies will also help his eczema, he was also encouraged to focus on hydration with Vaseline - triamcinolone cream (KENALOG) 0.1 %; Apply 1 application topically 2 (two) times daily. If needed (weaker strength)  Dispense: 453.6 g; Refill: 3 - betamethasone dipropionate 0.05 % cream; Apply topically 2 (two) times daily. Use SPARINGLY; can cause stretch marks  Dispense: 45 g; Refill: 1    Return in about 6 months (around 03/17/2020) for Routine follow-up, Annual Physical.   Delsa Grana, PA-C 09/17/19 8:21 AM

## 2019-09-18 LAB — COMPLETE METABOLIC PANEL WITH GFR
AG Ratio: 2.4 (calc) (ref 1.0–2.5)
ALT: 23 U/L (ref 9–46)
AST: 22 U/L (ref 10–40)
Albumin: 4.7 g/dL (ref 3.6–5.1)
Alkaline phosphatase (APISO): 64 U/L (ref 36–130)
BUN: 9 mg/dL (ref 7–25)
CO2: 29 mmol/L (ref 20–32)
Calcium: 10.1 mg/dL (ref 8.6–10.3)
Chloride: 105 mmol/L (ref 98–110)
Creat: 0.74 mg/dL (ref 0.60–1.35)
GFR, Est African American: 132 mL/min/{1.73_m2} (ref >=60)
GFR, Est Non African American: 114 mL/min/{1.73_m2} (ref >=60)
Globulin: 2 g/dL (calc) (ref 1.9–3.7)
Glucose, Bld: 76 mg/dL (ref 65–99)
Potassium: 4.2 mmol/L (ref 3.5–5.3)
Sodium: 140 mmol/L (ref 135–146)
Total Bilirubin: 0.4 mg/dL (ref 0.2–1.2)
Total Protein: 6.7 g/dL (ref 6.1–8.1)

## 2019-09-18 LAB — LIPID PANEL
Cholesterol: 100 mg/dL (ref <200)
HDL: 35 mg/dL — ABNORMAL LOW (ref >=40)
LDL Cholesterol (Calc): 36 mg/dL (calc)
Non-HDL Cholesterol (Calc): 65 mg/dL (calc) (ref <130)
Total CHOL/HDL Ratio: 2.9 (calc) (ref <5.0)
Triglycerides: 230 mg/dL — ABNORMAL HIGH (ref <150)

## 2019-12-01 ENCOUNTER — Other Ambulatory Visit: Payer: Self-pay | Admitting: Family Medicine

## 2019-12-01 DIAGNOSIS — L239 Allergic contact dermatitis, unspecified cause: Secondary | ICD-10-CM

## 2020-02-11 ENCOUNTER — Encounter: Payer: BC Managed Care – PPO | Admitting: Family Medicine

## 2020-02-18 ENCOUNTER — Other Ambulatory Visit (HOSPITAL_COMMUNITY)
Admission: RE | Admit: 2020-02-18 | Discharge: 2020-02-18 | Disposition: A | Discharge status: Home / Self Care | Payer: BC Managed Care – PPO | Source: Ambulatory Visit | Attending: Family Medicine | Admitting: Family Medicine

## 2020-02-18 ENCOUNTER — Encounter: Payer: Self-pay | Admitting: Family Medicine

## 2020-02-18 ENCOUNTER — Other Ambulatory Visit: Payer: Self-pay

## 2020-02-18 ENCOUNTER — Ambulatory Visit (INDEPENDENT_AMBULATORY_CARE_PROVIDER_SITE_OTHER): Payer: BC Managed Care – PPO | Admitting: Family Medicine

## 2020-02-18 VITALS — BP 120/80 | HR 73 | Temp 98.1°F | Resp 14 | Ht 70.5 in | Wt 215.1 lb

## 2020-02-18 DIAGNOSIS — F4010 Social phobia, unspecified: Secondary | ICD-10-CM

## 2020-02-18 DIAGNOSIS — Z113 Encounter for screening for infections with a predominantly sexual mode of transmission: Secondary | ICD-10-CM | POA: Insufficient documentation

## 2020-02-18 DIAGNOSIS — E781 Pure hyperglyceridemia: Secondary | ICD-10-CM | POA: Diagnosis not present

## 2020-02-18 DIAGNOSIS — L239 Allergic contact dermatitis, unspecified cause: Secondary | ICD-10-CM

## 2020-02-18 DIAGNOSIS — K2100 Gastro-esophageal reflux disease with esophagitis, without bleeding: Secondary | ICD-10-CM

## 2020-02-18 DIAGNOSIS — Z0000 Encounter for general adult medical examination without abnormal findings: Secondary | ICD-10-CM

## 2020-02-18 DIAGNOSIS — E782 Mixed hyperlipidemia: Secondary | ICD-10-CM

## 2020-02-18 DIAGNOSIS — J301 Allergic rhinitis due to pollen: Secondary | ICD-10-CM

## 2020-02-18 DIAGNOSIS — B001 Herpesviral vesicular dermatitis: Secondary | ICD-10-CM

## 2020-02-18 LAB — CBC WITH DIFFERENTIAL/PLATELET
Absolute Monocytes: 655 cells/uL (ref 200–950)
Basophils Absolute: 43 cells/uL (ref 0–200)
Basophils Relative: 0.6 %
Eosinophils Absolute: 346 cells/uL (ref 15–500)
Eosinophils Relative: 4.8 %
HCT: 42.3 % (ref 38.5–50.0)
Hemoglobin: 13.9 g/dL (ref 13.2–17.1)
Lymphs Abs: 1872 cells/uL (ref 850–3900)
MCH: 29.5 pg (ref 27.0–33.0)
MCHC: 32.9 g/dL (ref 32.0–36.0)
MCV: 89.8 fL (ref 80.0–100.0)
MPV: 10.1 fL (ref 7.5–12.5)
Monocytes Relative: 9.1 %
Neutro Abs: 4284 cells/uL (ref 1500–7800)
Neutrophils Relative %: 59.5 %
Platelets: 286 10*3/uL (ref 140–400)
RBC: 4.71 10*6/uL (ref 4.20–5.80)
RDW: 12 % (ref 11.0–15.0)
Total Lymphocyte: 26 %
WBC: 7.2 10*3/uL (ref 3.8–10.8)

## 2020-02-18 LAB — COMPLETE METABOLIC PANEL WITH GFR
AG Ratio: 2.3 (calc) (ref 1.0–2.5)
ALT: 38 U/L (ref 9–46)
AST: 22 U/L (ref 10–40)
Albumin: 4.9 g/dL (ref 3.6–5.1)
Alkaline phosphatase (APISO): 68 U/L (ref 36–130)
BUN: 14 mg/dL (ref 7–25)
CO2: 31 mmol/L (ref 20–32)
Calcium: 10.1 mg/dL (ref 8.6–10.3)
Chloride: 103 mmol/L (ref 98–110)
Creat: 0.89 mg/dL (ref 0.60–1.35)
GFR, Est African American: 122 mL/min/{1.73_m2} (ref >=60)
GFR, Est Non African American: 105 mL/min/{1.73_m2} (ref >=60)
Globulin: 2.1 g/dL (calc) (ref 1.9–3.7)
Glucose, Bld: 88 mg/dL (ref 65–99)
Potassium: 4.8 mmol/L (ref 3.5–5.3)
Sodium: 140 mmol/L (ref 135–146)
Total Bilirubin: 0.4 mg/dL (ref 0.2–1.2)
Total Protein: 7 g/dL (ref 6.1–8.1)

## 2020-02-18 LAB — LIPID PANEL
Cholesterol: 119 mg/dL (ref <200)
HDL: 38 mg/dL — ABNORMAL LOW (ref >=40)
LDL Cholesterol (Calc): 63 mg/dL (calc)
Non-HDL Cholesterol (Calc): 81 mg/dL (calc) (ref <130)
Total CHOL/HDL Ratio: 3.1 (calc) (ref <5.0)
Triglycerides: 101 mg/dL (ref <150)

## 2020-02-18 MED ORDER — PANTOPRAZOLE SODIUM 40 MG PO TBEC: 40.0000 mg | DELAYED_RELEASE_TABLET | Freq: Two times a day (BID) | ORAL | 2 refills | Status: DC

## 2020-02-18 MED ORDER — MONTELUKAST SODIUM 10 MG PO TABS: ORAL_TABLET | ORAL | 3 refills | Status: DC

## 2020-02-18 MED ORDER — ATORVASTATIN CALCIUM 20 MG PO TABS: 20.0000 mg | ORAL_TABLET | Freq: Every day | ORAL | 3 refills | Status: DC

## 2020-02-18 MED ORDER — BETAMETHASONE DIPROPIONATE 0.05 % EX CREA: TOPICAL_CREAM | Freq: Two times a day (BID) | CUTANEOUS | 3 refills | Status: DC

## 2020-02-18 MED ORDER — SERTRALINE HCL 50 MG PO TABS: 50.0000 mg | ORAL_TABLET | Freq: Every day | ORAL | 3 refills | Status: DC

## 2020-02-18 MED ORDER — VALACYCLOVIR HCL 500 MG PO TABS: 500.0000 mg | ORAL_TABLET | Freq: Two times a day (BID) | ORAL | 5 refills | Status: DC | PRN

## 2020-02-18 NOTE — Progress Notes (Signed)
Patient: Joe Patterson, Male    DOB: June 06, 1976, 44 y.o.   MRN: [id number] Delsa Grana, PA-C Visit Date: 02/18/2020  Today's Provider: Delsa Grana, PA-C   Chief Complaint  Patient presents with  . Annual Exam   Subjective:   Annual physical exam:  Joe Patterson is a 44 y.o. male who presents today for health maintenance and annual & complete physical exam.  He feels well.  He reports exercising - just got back into the gym Diet -  Healthy cut out sugar, not eating out  He reports he is sleeping well - sleeping fairly well does with 3rd shift  Seeing GI but still very symptomatic and having some damage to teeth  Wt Readings from Last 5 Encounters:  02/18/20 215 lb 1.6 oz (97.6 kg)  09/17/19 214 lb 4.8 oz (97.2 kg)  07/27/19 215 lb (97.5 kg)  02/08/19 225 lb (102.1 kg)  02/05/19 233 lb 4.8 oz (105.8 kg)   BMI Readings from Last 5 Encounters:  02/18/20 30.43 kg/m  09/17/19 30.31 kg/m  07/27/19 29.99 kg/m  02/08/19 31.83 kg/m  02/05/19 33.48 kg/m   GERD poorly controlled on prilosec, switch to protonix - seeing Dr. Vicente Males  Social anxiety - well controlled on zoloft 50 mg    USPSTF grade A and B recommendations - reviewed and addressed today  Depression:  Phq 9 completed today by patient, was reviewed by me with patient in the room, score is  negative, pt feels good, moods good PHQ 2/9 Scores 02/18/2020 09/17/2019 02/05/2019 11/21/2018  PHQ - 2 Score 0 0 0 0  PHQ- 9 Score 0 0 0 0   Depression screen [facility] 2/9 02/18/2020 09/17/2019 02/05/2019 11/21/2018 05/08/2018  Decreased Interest 0 0 0 0 0  Down, Depressed, Hopeless 0 0 0 0 0  PHQ - 2 Score 0 0 0 0 0  Altered sleeping 0 0 0 0 0  Tired, decreased energy 0 0 0 0 0  Change in appetite 0 0 0 0 1  Feeling bad or failure about yourself  0 0 0 0 0  Trouble concentrating 0 0 0 0 0  Moving slowly or fidgety/restless 0 0 0 0 0  Suicidal thoughts 0 0 0 0 0  PHQ-9 Score 0 0 0 0 1  Difficult doing work/chores Not  difficult at all Not difficult at all - Not difficult at all Not difficult at all   Hep C Screening: not indicated STD testing and prevention (HIV/chl/gon/syphilis):  None  Prostate cancer:  No past screening, some hx of over active  bladder  No results found for: PSA Intimate partner violence: none  Urinary Symptoms:  IPSS Questionnaire (AUA-7): Over the past month.   1)  How often have you had a sensation of not emptying your bladder completely after you finish urinating?  0 - Not at all  2)  How often have you had to urinate again less than two hours after you finished urinating? 0 - Not at all  3)  How often have you found you stopped and started again several times when you urinated?  0 - Not at all  4) How difficult have you found it to postpone urination?  0 - Not at all  5) How often have you had a weak urinary stream?  0 - Not at all  6) How often have you had to push or strain to begin urination?  0 - Not at all  7) How many times  did you most typically get up to urinate from the time you went to bed until the time you got up in the morning?  2 - 2 times  Total score:  0-7 mildly symptomatic - score 2   8-19 moderately symptomatic   20-35 severely symptomatic   Advanced Care Planning:  A voluntary discussion about advance care planning including the explanation and discussion of advance directives.  Discussed health care proxy and Living will.  Patient does not have a living will at present time. If patient does have living will, I have requested they bring this to the clinic to be scanned in to their chart.  Skin cancer:   last skin survey was.  Pt reports no hx of skin cancer, suspicious lesions/biopsies in the past.  Colorectal cancer:  colonoscopy is - is seeing GI  Lung cancer:   Low Dose CT Chest recommended if Age 38-80 years, 30 pack-year currently smoking OR have quit w/in 15years. Patient does not qualify.   Social History   Tobacco Use  . Smoking status: Former  Smoker    Packs/day: 1.00    Years: 15.00    Pack years: 15.00    Types: Cigarettes    Quit date: 12/23/2007    Years since quitting: 12.1  . Smokeless tobacco: Former Systems developer    Types: Chew    Quit date: 2009  Substance Use Topics  . Alcohol use: No     Alcohol screening:   Office Visit from 02/18/2020 in [facility]  AUDIT-C Score  0      AAA:  Not yet indicated for age:  The USPSTF recommends one-time screening with ultrasonography in men ages 23 to 10 years who have ever smoked  KCM:KLKJ today  Blood pressure/Hypertension: BP Readings from Last 3 Encounters:  02/18/20 120/80  09/17/19 116/78  07/28/19 118/68   Weight/Obesity: Wt Readings from Last 3 Encounters:  02/18/20 215 lb 1.6 oz (97.6 kg)  09/17/19 214 lb 4.8 oz (97.2 kg)  07/27/19 215 lb (97.5 kg)   BMI Readings from Last 3 Encounters:  02/18/20 30.43 kg/m  09/17/19 30.31 kg/m  07/27/19 29.99 kg/m    Lipids:  Lab Results  Component Value Date   CHOL 100 09/17/2019   CHOL 120 02/05/2019   CHOL 148 11/21/2018   Lab Results  Component Value Date   HDL 35 (L) 09/17/2019   HDL 38 (L) 02/05/2019   HDL 44 11/21/2018   Lab Results  Component Value Date   LDLCALC 36 09/17/2019   LDLCALC 65 02/05/2019   LDLCALC 71 11/21/2018   Lab Results  Component Value Date   TRIG 230 (H) 09/17/2019   TRIG 83 02/05/2019   TRIG 253 (H) 11/21/2018   Lab Results  Component Value Date   CHOLHDL 2.9 09/17/2019   CHOLHDL 3.2 02/05/2019   CHOLHDL 3.4 11/21/2018   No results found for: LDLDIRECT Based on the results of lipid panel his/her cardiovascular risk factor ( using [place] )  in the next 10 years is : The ASCVD Risk score Mikey Bussing DC Jr., et al., 2013) failed to calculate for the following reasons:   The valid total cholesterol range is 130 to 320 mg/dL Glucose:  Glucose, Bld  Date Value Ref Range Status  09/17/2019 76 65 - 99 mg/dL Final    Comment:    .            Fasting  reference interval .   07/27/2019 100 (H) 70 -  99 mg/dL Final  02/05/2019 86 65 - 99 mg/dL Final    Comment:    .            Fasting reference interval .     Social History      He  reports that he quit smoking about 12 years ago. His smoking use included cigarettes. He has a 15.00 pack-year smoking history. He quit smokeless tobacco use about 12 years ago.  His smokeless tobacco use included chew. He reports that he does not drink alcohol or use drugs.       Social History   Socioeconomic History  . Marital status: Single    Spouse name: Not on file  . Number of children: Not on file  . Years of education: 80  . Highest education level: Bachelor's degree (e.g., BA, AB, BS)  Occupational History  . Occupation: [employer] [place]  Tobacco Use  . Smoking status: Former Smoker    Packs/day: 1.00    Years: 15.00    Pack years: 15.00    Types: Cigarettes    Quit date: 12/23/2007    Years since quitting: 12.1  . Smokeless tobacco: Former Systems developer    Types: [place] date: 2009  Substance and Sexual Activity  . Alcohol use: No  . Drug use: No  . Sexual activity: Yes  Other Topics Concern  . Not on file  Social History Narrative  . Not on file   Social Determinants of Health   Financial Resource Strain:   . Difficulty of Paying Living Expenses: Not on file  Food Insecurity:   . Worried About Charity fundraiser in the Last Year: Not on file  . Ran Out of Food in the Last Year: Not on file  Transportation Needs:   . Lack of Transportation (Medical): Not on file  . Lack of Transportation (Non-Medical): Not on file  Physical Activity:   . Days of Exercise per Week: Not on file  . Minutes of Exercise per Session: Not on file  Stress:   . Feeling of Stress : Not on file  Social Connections:   . Frequency of Communication with Friends and Family: Not on file  . Frequency of Social Gatherings with Friends and Family: Not on file  . Attends Religious Services: Not on file  .  Active Member of Clubs or Organizations: Not on file  . Attends Archivist Meetings: Not on file  . Marital Status: Not on file           Family History        Family Status  Relation Name Status  . Mother  Alive  . Father  Alive  . MGM  Deceased       lymphoma  . MGF  Deceased       heart attack  . PGM  Deceased  . Sister  Alive  . PGF  Deceased       Parkinson dieases   . Neg Hx  (Not Specified)        His family history includes Blindness in his paternal grandmother; Cancer in his maternal grandmother; Hearing loss in his father; Heart disease in his maternal grandfather; Lung disease in his paternal grandmother; Multiple sclerosis in his mother; Parkinson's disease in his paternal grandfather. There is no history of COPD, Diabetes, Hypertension, Stroke, or Colon cancer.       Family History  Problem Relation Age of Onset  . Multiple sclerosis  Mother   . Hearing loss Father   . Cancer Maternal Grandmother        lymphoma  . Heart disease Maternal Grandfather        early 51s  . Lung disease Paternal Grandmother   . Blindness Paternal Grandmother   . Parkinson's disease Paternal Grandfather   . COPD Neg Hx   . Diabetes Neg Hx   . Hypertension Neg Hx   . Stroke Neg Hx   . Colon cancer Neg Hx     Patient Active Problem List   Diagnosis Date Noted  . Appendicitis 07/27/2019  . Hypertriglyceridemia 11/26/2018  . Elevated glucose 11/26/2018  . Morbid obesity ([place]) 11/21/2018  . Fever blister 05/08/2018  . Medication monitoring encounter 11/20/2017  . Allergic rhinitis 11/14/2017  . Weight gain 11/14/2017  . Hyperlipidemia 11/14/2017  . Preventative health care 07/05/2017  . TMJ (temporomandibular joint syndrome) 06/22/2016  . Shoulder pain, bilateral 06/22/2016  . Cyst of skin and subcutaneous tissue 06/22/2016  . Social anxiety disorder 12/29/2015  . Allergy to perfume 12/29/2015  . History of esophageal stricture 12/29/2015  . Eczema, allergic  12/29/2015  . Gastroesophageal reflux 12/29/2015  . Non-celiac gluten sensitivity 12/29/2015    Past Surgical History:  Procedure Laterality Date  . APPENDECTOMY  2020  . COLONOSCOPY, ESOPHAGOGASTRODUODENOSCOPY (EGD) AND ESOPHAGEAL DILATION    . ESOPHAGOGASTRODUODENOSCOPY (EGD) WITH PROPOFOL N/A 02/08/2019   Procedure: ESOPHAGOGASTRODUODENOSCOPY (EGD) WITH PROPOFOL;  Surgeon: Jonathon Bellows, MD;  Location: [facility] ENDOSCOPY;  Service: Gastroenterology;  Laterality: N/A;  . HERNIA REPAIR    . LAPAROSCOPIC APPENDECTOMY N/A 07/27/2019   Procedure: APPENDECTOMY LAPAROSCOPIC;  Surgeon: Jules Husbands, MD;  Location: [facility] ORS;  Service: General;  Laterality: N/A;  . TYMPANOSTOMY TUBE PLACEMENT     as a child     Current Outpatient Medications:  .  atorvastatin (LIPITOR) 20 MG tablet, Take 1 tablet (20 mg total) by mouth at bedtime., Disp: 30 tablet, Rfl: 5 .  betamethasone dipropionate 0.05 % cream, APPLY TOPICALLY 2 (TWO) TIMES DAILY. USE SPARINGLY CAN CAUSE STRETCH MARKS, Disp: 45 g, Rfl: 3 .  montelukast (SINGULAIR) 10 MG tablet, TAKE 1 TABLET BY MOUTH EVERYDAY AT BEDTIME, Disp: 30 tablet, Rfl: 11 .  omeprazole (PRILOSEC) 20 MG capsule, Take 1 capsule (20 mg total) by mouth 2 (two) times daily before a meal., Disp: 90 capsule, Rfl: 3 .  sertraline (ZOLOFT) 50 MG tablet, Take 1 tablet (50 mg total) by mouth daily., Disp: 30 tablet, Rfl: 11 .  triamcinolone cream (KENALOG) 0.1 %, Apply 1 application topically 2 (two) times daily. If needed (weaker strength), Disp: 453.6 g, Rfl: 3 .  Turmeric 500 MG TABS, Take 500 mg by mouth daily., Disp: , Rfl:  .  valACYclovir (VALTREX) 500 MG tablet, At symptom onset take one tablet PO BID for 5 days, Disp: 10 tablet, Rfl: 3 .  cetirizine (ZYRTEC) 10 MG tablet, Take 1 tablet (10 mg total) by mouth daily., Disp: 90 tablet, Rfl: 3  No Known Allergies  Patient Care Team: Delsa Grana, PA-C as PCP - General (Family Medicine)  I personally reviewed active problem  list, medication list, allergies, family history, social history, health maintenance, notes from last encounter, lab results, endoscopy procedure notes, imaging with the patient/caregiver today.  Review of Systems  10 Systems reviewed and are negative for acute change except as noted in the HPI.       Objective:   Vitals:  Vitals:   02/18/20 0755  BP: 120/80  Pulse: 73  Resp: 14  Temp: 98.1 F (36.7 C)  SpO2: 98%  Weight: 215 lb 1.6 oz (97.6 kg)  Height: 5' 10.5" (1.791 m)    Body mass index is 30.43 kg/m.  Physical Exam Vitals and nursing note reviewed.  Constitutional:      General: He is not in acute distress.    Appearance: Normal appearance. He is well-developed. He is not ill-appearing, toxic-appearing or diaphoretic.     Interventions: Face mask in place.  HENT:     Head: Normocephalic and atraumatic.     Jaw: No trismus.     Right Ear: External ear normal.     Left Ear: External ear normal.  Eyes:     General: Lids are normal. No scleral icterus.    Conjunctiva/sclera: Conjunctivae normal.     Pupils: Pupils are equal, round, and reactive to light.  Neck:     Trachea: Trachea and phonation normal. No tracheal deviation.  Cardiovascular:     Rate and Rhythm: Normal rate and regular rhythm.     Pulses: Normal pulses.          Radial pulses are 2+ on the right side and 2+ on the left side.       Posterior tibial pulses are 2+ on the right side and 2+ on the left side.     Heart sounds: Normal heart sounds. No murmur. No friction rub. No gallop.   Pulmonary:     Effort: Pulmonary effort is normal. No respiratory distress.     Breath sounds: Normal breath sounds. No stridor. No wheezing, rhonchi or rales.  Abdominal:     General: Bowel sounds are normal. There is no distension.     Palpations: Abdomen is soft.     Tenderness: There is no abdominal tenderness. There is no guarding or rebound.  Musculoskeletal:        General: Normal range of motion.      Cervical back: Normal range of motion and neck supple.     Right lower leg: No edema.     Left lower leg: No edema.  Skin:    General: Skin is warm and dry.     Capillary Refill: Capillary refill takes less than 2 seconds.     Coloration: Skin is not jaundiced.     Findings: No rash.     Nails: There is no clubbing.  Neurological:     Mental Status: He is alert.     Cranial Nerves: No dysarthria or facial asymmetry.     Motor: No tremor or abnormal muscle tone.     Gait: Gait normal.  Psychiatric:        Mood and Affect: Mood normal.        Speech: Speech normal.        Behavior: Behavior normal. Behavior is cooperative.      No results found for this or any previous visit (from the past 2160 hour(s)).   PHQ2/9: Depression screen [facility] 2/9 02/18/2020 09/17/2019 02/05/2019 11/21/2018 05/08/2018  Decreased Interest 0 0 0 0 0  Down, Depressed, Hopeless 0 0 0 0 0  PHQ - 2 Score 0 0 0 0 0  Altered sleeping 0 0 0 0 0  Tired, decreased energy 0 0 0 0 0  Change in appetite 0 0 0 0 1  Feeling bad or failure about yourself  0 0 0 0 0  Trouble concentrating 0 0 0 0 0  Moving slowly or fidgety/restless 0 0  0 0 0  Suicidal thoughts 0 0 0 0 0  PHQ-9 Score 0 0 0 0 1  Difficult doing work/chores Not difficult at all Not difficult at all - Not difficult at all Not difficult at all    Fall Risk: Fall Risk  02/18/2020 09/17/2019 02/05/2019 11/21/2018 01/04/2018  Falls in the past year? 0 0 0 0 No  Number falls in past yr: 0 0 0 - -  Injury with Fall? 0 0 0 - -  Follow up - - Falls evaluation completed - -    Functional Status Survey: Is the patient deaf or have difficulty hearing?: No Does the patient have difficulty seeing, even when wearing glasses/contacts?: No Does the patient have difficulty concentrating, remembering, or making decisions?: No Does the patient have difficulty walking or climbing stairs?: No Does the patient have difficulty dressing or bathing?: No Does the patient have  difficulty doing errands alone such as visiting a doctor's office or shopping?: No   Assessment & Plan:    CPE completed today  . Prostate cancer screening and PSA options (with potential risks and benefits of testing vs not testing) were discussed along with recent recs/guidelines, shared decision making and handout/information given to pt today  . USPSTF grade A and B recommendations reviewed with patient; age-appropriate recommendations, preventive care, screening tests, etc discussed and encouraged; healthy living encouraged; see AVS for patient education given to patient  . Discussed importance of 150 minutes of physical activity weekly, AHA exercise recommendations given to pt in AVS/handout  . Discussed importance of healthy diet:  eating lean meats and proteins, avoiding trans fats and saturated fats, avoid simple sugars and excessive carbs in diet, eat 6 servings of fruit/vegetables daily and drink plenty of water and avoid sweet beverages.  DASH diet reviewed if pt has HTN  . Recommended pt to do annual eye exam and routine dental exams/cleanings  . Reviewed Health Maintenance: Health Maintenance  Topic Date Due  . TETANUS/TDAP  12/22/2025  . INFLUENZA VACCINE  Completed  . HIV Screening  Completed    . Immunizations: Immunization History  Administered Date(s) Administered  . DTaP 07/22/1977, 10/03/1977, 05/11/1978, 10/31/1978  . Hepatitis B 12/27/1994, 01/31/1995, 10/27/1995  . IPV 05/11/1977, 07/22/1977, 10/31/1978, 03/12/1982  . Influenza,inj,Quad PF,6+ Mos 09/13/2018, 08/01/2019  . Influenza,inj,quad, With Preservative 11/12/2016  . Influenza-Unspecified 09/12/2015  . MMR 06/22/1978, 11/18/1997  . Td 03/12/1982, 11/18/1997  . Tdap 12/23/2015  01/15/2020 02/12/2020 moderna COVID Vax     ICD-10-CM   1. Adult general medical exam  Z00.00 CBC with Differential/Platelet    COMPLETE METABOLIC PANEL WITH GFR    Lipid panel  2. Mixed hyperlipidemia  E78.2 COMPLETE  METABOLIC PANEL WITH GFR    Lipid panel    atorvastatin (LIPITOR) 20 MG tablet  3. Hypertriglyceridemia  X52.8 COMPLETE METABOLIC PANEL WITH GFR    Lipid panel  4. Gastroesophageal reflux disease with esophagitis, unspecified whether hemorrhage  K21.00 pantoprazole (PROTONIX) 40 MG tablet  5. Fever blister  B00.1 valACYclovir (VALTREX) 500 MG tablet   meds refilled  6. Screening for STD (sexually transmitted disease)  Z11.3 Urine cytology ancillary only   pt requests urine std testing  7. Seasonal allergic rhinitis due to pollen  J30.1 montelukast (SINGULAIR) 10 MG tablet  8. Social anxiety disorder  F40.10 sertraline (ZOLOFT) 50 MG tablet  9. Eczema, allergic  L23.9 betamethasone dipropionate 0.05 % cream       Delsa Grana, PA-C 02/18/20 8:11 AM  [facility]  [place]

## 2020-02-18 NOTE — Patient Instructions (Signed)
Preventive Care 40-43 Years Old, Male Preventive care refers to lifestyle choices and visits with your health care provider that can promote health and wellness. This includes:  A yearly physical exam. This is also called an annual well check.  Regular dental and eye exams.  Immunizations.  Screening for certain conditions.  Healthy lifestyle choices, such as eating a healthy diet, getting regular exercise, not using drugs or products that contain nicotine and tobacco, and limiting alcohol use. What can I expect for my preventive care visit? Physical exam Your health care provider will check:  Height and weight. These may be used to calculate body mass index (BMI), which is a measurement that tells if you are at a healthy weight.  Heart rate and blood pressure.  Your skin for abnormal spots. Counseling Your health care provider may ask you questions about:  Alcohol, tobacco, and drug use.  Emotional well-being.  Home and relationship well-being.  Sexual activity.  Eating habits.  Work and work environment. What immunizations do I need?  Influenza (flu) vaccine  This is recommended every year. Tetanus, diphtheria, and pertussis (Tdap) vaccine  You may need a Td booster every 10 years. Varicella (chickenpox) vaccine  You may need this vaccine if you have not already been vaccinated. Zoster (shingles) vaccine  You may need this after age 60. Measles, mumps, and rubella (MMR) vaccine  You may need at least one dose of MMR if you were born in 1957 or later. You may also need a second dose. Pneumococcal conjugate (PCV13) vaccine  You may need this if you have certain conditions and were not previously vaccinated. Pneumococcal polysaccharide (PPSV23) vaccine  You may need one or two doses if you smoke cigarettes or if you have certain conditions. Meningococcal conjugate (MenACWY) vaccine  You may need this if you have certain conditions. Hepatitis A vaccine   You may need this if you have certain conditions or if you travel or work in places where you may be exposed to hepatitis A. Hepatitis B vaccine  You may need this if you have certain conditions or if you travel or work in places where you may be exposed to hepatitis B. Haemophilus influenzae type b (Hib) vaccine  You may need this if you have certain risk factors. Human papillomavirus (HPV) vaccine  If recommended by your health care provider, you may need three doses over 6 months. You may receive vaccines as individual doses or as more than one vaccine together in one shot (combination vaccines). Talk with your health care provider about the risks and benefits of combination vaccines. What tests do I need? Blood tests  Lipid and cholesterol levels. These may be checked every 5 years, or more frequently if you are over 50 years old.  Hepatitis C test.  Hepatitis B test. Screening  Lung cancer screening. You may have this screening every year starting at age 55 if you have a 30-pack-year history of smoking and currently smoke or have quit within the past 15 years.  Prostate cancer screening. Recommendations will vary depending on your family history and other risks.  Colorectal cancer screening. All adults should have this screening starting at age 50 and continuing until age 75. Your health care provider may recommend screening at age 45 if you are at increased risk. You will have tests every 1-10 years, depending on your results and the type of screening test.  Diabetes screening. This is done by checking your blood sugar (glucose) after you have not eaten   for a while (fasting). You may have this done every 1-3 years.  Sexually transmitted disease (STD) testing. Follow these instructions at home: Eating and drinking  Eat a diet that includes fresh fruits and vegetables, whole grains, lean protein, and low-fat dairy products.  Take vitamin and mineral supplements as recommended  by your health care provider.  Do not drink alcohol if your health care provider tells you not to drink.  If you drink alcohol: ? Limit how much you have to 0-2 drinks a day. ? Be aware of how much alcohol is in your drink. In the [place] one drink equals one 12 oz bottle of beer (355 mL), one 5 oz glass of wine (148 mL), or one 1 oz glass of hard liquor (44 mL). Lifestyle  Take daily care of your teeth and gums.  Stay active. Exercise for at least 30 minutes on 5 or more days each week.  Do not use any products that contain nicotine or tobacco, such as cigarettes, e-cigarettes, and chewing tobacco. If you need help quitting, ask your health care provider.  If you are sexually active, practice safe sex. Use a condom or other form of protection to prevent STIs (sexually transmitted infections).  Talk with your health care provider about taking a low-dose aspirin every day starting at age 18. What's next?  Go to your health care provider once a year for a well check visit.  Ask your health care provider how often you should have your eyes and teeth checked.  Stay up to date on all vaccines. This information is not intended to replace advice given to you by your health care provider. Make sure you discuss any questions you have with your health care provider. Document Revised: 11/22/2018 Document Reviewed: 11/22/2018 Elsevier Patient Education  2020 [employer].

## 2020-02-19 LAB — URINE CYTOLOGY ANCILLARY ONLY
Chlamydia: NEGATIVE
Comment: NEGATIVE
Comment: NEGATIVE
Comment: NORMAL
Neisseria Gonorrhea: NEGATIVE
Trichomonas: NEGATIVE

## 2020-06-04 ENCOUNTER — Ambulatory Visit
Admission: RE | Admit: 2020-06-04 | Discharge: 2020-06-04 | Disposition: A | Discharge status: Home / Self Care | Payer: BC Managed Care – PPO | Source: Ambulatory Visit | Attending: Family Medicine | Admitting: Family Medicine

## 2020-06-04 ENCOUNTER — Encounter: Payer: Self-pay | Admitting: Family Medicine

## 2020-06-04 ENCOUNTER — Ambulatory Visit
Admission: RE | Admit: 2020-06-04 | Discharge: 2020-06-04 | Disposition: A | Discharge status: Home / Self Care | Payer: BC Managed Care – PPO | Attending: Family Medicine | Admitting: Family Medicine

## 2020-06-04 ENCOUNTER — Ambulatory Visit: Payer: BC Managed Care – PPO | Admitting: Family Medicine

## 2020-06-04 ENCOUNTER — Other Ambulatory Visit: Payer: Self-pay

## 2020-06-04 VITALS — BP 120/72 | HR 72 | Temp 97.9°F | Resp 16 | Ht 70.5 in | Wt 225.7 lb

## 2020-06-04 DIAGNOSIS — R0789 Other chest pain: Secondary | ICD-10-CM

## 2020-06-04 DIAGNOSIS — M545 Low back pain, unspecified: Secondary | ICD-10-CM

## 2020-06-04 DIAGNOSIS — K219 Gastro-esophageal reflux disease without esophagitis: Secondary | ICD-10-CM

## 2020-06-04 NOTE — Patient Instructions (Signed)
Follow up with any worsening of your chest wall/sternum pain  Go get your X-ray across the street and start PT and follow up in clinic to address and evaluate low back pain.   Chest Wall Pain Chest wall pain is pain in or around the bones and muscles of your chest. Chest wall pain may be caused by:  An injury.  Coughing a lot.  Using your chest and arm muscles too much. Sometimes, the cause may not be known. This pain may take a few weeks or longer to get better. Follow these instructions at home: Managing pain, stiffness, and swelling If told, put ice on the painful area:  Put ice in a plastic bag.  Place a towel between your skin and the bag.  Leave the ice on for 20 minutes, 2-3 times a day.  Activity  Rest as told by your doctor.  Avoid doing things that cause pain. This includes lifting heavy items.  Ask your doctor what activities are safe for you. General instructions   Take over-the-counter and prescription medicines only as told by your doctor.  Do not use any products that contain nicotine or tobacco, such as cigarettes, e-cigarettes, and chewing tobacco. If you need help quitting, ask your doctor.  Keep all follow-up visits as told by your doctor. This is important. Contact a doctor if:  You have a fever.  Your chest pain gets worse.  You have new symptoms. Get help right away if:  You feel sick to your stomach (nauseous) or you throw up (vomit).  You feel sweaty or light-headed.  You have a cough with mucus from your lungs (sputum) or you cough up blood.  You are short of breath. These symptoms may be an emergency. Do not wait to see if the symptoms will go away. Get medical help right away. Call your local emergency services (911 in the [place] Do not drive yourself to the hospital. Summary  Chest wall pain is pain in or around the bones and muscles of your chest.  It may be treated with ice, rest, and medicines. Your condition may also get  better if you avoid doing things that cause pain.  Contact a doctor if you have a fever, chest pain that gets worse, or new symptoms.  Get help right away if you feel light-headed or you get short of breath. These symptoms may be an emergency. This information is not intended to replace advice given to you by your health care provider. Make sure you discuss any questions you have with your health care provider. Document Revised: 05/31/2018 Document Reviewed: 05/31/2018 Elsevier Patient Education  2020 [employer].    Lumbosacral Radiculopathy Lumbosacral radiculopathy is a condition that involves the spinal nerves and nerve roots in the low back and bottom of the spine. The condition develops when these nerves and nerve roots move out of place or become inflamed and cause symptoms. What are the causes? This condition may be caused by:  Pressure from a disk that bulges out of place (herniated disk). A disk is a plate of soft cartilage that separates bones in the spine.  Disk changes that occur with age (disk degeneration).  A narrowing of the bones of the lower back (spinal stenosis).  A tumor.  An infection.  An injury that places sudden pressure on the disks that cushion the bones of your lower spine. What increases the risk? You are more likely to develop this condition if:  You are a male who is  27-18 years old.  You are a male who is 13-54 years old.  You use improper technique when lifting things.  You are overweight or live a sedentary lifestyle.  Your work requires frequent lifting.  You smoke.  You do repetitive activities that strain the spine. What are the signs or symptoms? Symptoms of this condition include:  Pain that goes down from your back into your legs (sciatica), usually on one side of the body. This is the most common symptom. The pain may be worse with sitting, coughing, or sneezing.  Pain and numbness in your legs.  Muscle  weakness.  Tingling.  Loss of bladder control or bowel control. How is this diagnosed? This condition may be diagnosed based on:  Your symptoms and medical history.  A physical exam. If the pain is lasting, you may have tests, such as:  MRI scan.  X-ray.  CT scan.  A type of X-ray used to examine the spinal canal after injecting a dye into your spine (myelogram).  A test to measure how electrical impulses move through a nerve (nerve conduction study). How is this treated? Treatment may depend on the cause of the condition and may include:  Working with a physical therapist.  Taking pain medicine.  Applying heat and ice to affected areas.  Doing stretches to improve flexibility.  Doing exercises to strengthen back muscles.  Having chiropractic spinal manipulation.  Using transcutaneous electrical nerve stimulation (TENS) therapy.  Getting a steroid injection in the spine. In some cases, no treatment is needed. If the condition is long-lasting (chronic), or if symptoms are severe, treatment may involve surgery or lifestyle changes, such as following a weight-loss plan. Follow these instructions at home: Activity  Avoid bending and other activities that make the problem worse.  Maintain a proper position when standing or sitting: ? When standing, keep your upper back and neck straight, with your shoulders pulled back. Avoid slouching. ? When sitting, keep your back straight and relax your shoulders. Do not round your shoulders or pull them backward.  Do not sit or stand in one place for long periods of time.  Take brief periods of rest throughout the day. This will reduce your pain. It is usually better to rest by lying down or standing, not sitting.  When you are resting for longer periods, mix in some mild activity or stretching between periods of rest. This will help to prevent stiffness and pain.  Get regular exercise. Ask your health care provider what  activities are safe for you. If you were shown how to do any exercises or stretches, do them as directed by your health care provider.  Do not lift anything that is heavier than 10 lb (4.5 kg) or the limit that you are told by your health care provider. Always use proper lifting technique, which includes: ? Bending your knees. ? Keeping the load close to your body. ? Avoiding twisting. Managing pain  If directed, put ice on the affected area: ? Put ice in a plastic bag. ? Place a towel between your skin and the bag. ? Leave the ice on for 20 minutes, 2-3 times a day.  If directed, apply heat to the affected area as often as told by your health care provider. Use the heat source that your health care provider recommends, such as a moist heat pack or a heating pad. ? Place a towel between your skin and the heat source. ? Leave the heat on for 20-30 minutes. ?  Remove the heat if your skin turns bright red. This is especially important if you are unable to feel pain, heat, or cold. You may have a greater risk of getting burned.  Take over-the-counter and prescription medicines only as told by your health care provider. General instructions  Sleep on a firm mattress in a comfortable position. Try lying on your side with your knees slightly bent. If you lie on your back, put a pillow under your knees.  Do not drive or use heavy machinery while taking prescription pain medicine.  If your health care provider prescribed a diet or exercise program, follow it as directed.  Keep all follow-up visits as told by your health care provider. This is important. Contact a health care provider if:  Your pain does not improve over time, even when taking pain medicines. Get help right away if:  You develop severe pain.  Your pain suddenly gets worse.  You develop increasing weakness in your legs.  You lose the ability to control your bladder or bowel.  You have difficulty walking or  balancing.  You have a fever. Summary  Lumbosacral radiculopathy is a condition that occurs when the spinal nerves and nerve roots in the lower part of the spine move out of place or become inflamed and cause symptoms.  Symptoms include pain, numbness, and tingling that go down from your back into your legs (sciatica), muscle weakness, and loss of bladder control or bowel control.  If directed, apply ice or heat to the affected area as told by your health care provider.  Follow instructions about activity, rest, and proper lifting technique. This information is not intended to replace advice given to you by your health care provider. Make sure you discuss any questions you have with your health care provider. Document Revised: 11/16/2017 Document Reviewed: 11/16/2017 Elsevier Patient Education  2020 [employer].

## 2020-06-04 NOTE — Progress Notes (Signed)
Patient ID: Joe Patterson, male    DOB: 1976-12-09, 44 y.o.   MRN: [id number]  PCP: Danelle Berry, PA-C  Chief Complaint  Patient presents with  . Hiatal Hernia    knot in the middle of chest, painful to press on    Subjective:   Joe Patterson is a 44 y.o. male, presents to clinic with CC of the following:  HPI  Pt has ttp to central chest wall - lower sternum area - he has some worse gerd and started pushing on his chest - then it was tender and felt swollen  Also   Patient Active Problem List   Diagnosis Date Noted  . Hypertriglyceridemia 11/26/2018  . Elevated glucose 11/26/2018  . Fever blister 05/08/2018  . Allergic rhinitis 11/14/2017  . Hyperlipidemia 11/14/2017  . TMJ (temporomandibular joint syndrome) 06/22/2016  . Shoulder pain, bilateral 06/22/2016  . Social anxiety disorder 12/29/2015  . Allergy to perfume 12/29/2015  . History of esophageal stricture 12/29/2015  . Eczema, allergic 12/29/2015  . Gastroesophageal reflux 12/29/2015      Current Outpatient Medications:  .  atorvastatin (LIPITOR) 20 MG tablet, Take 1 tablet (20 mg total) by mouth at bedtime., Disp: 90 tablet, Rfl: 3 .  betamethasone dipropionate 0.05 % cream, Apply topically 2 (two) times daily. Use SPARINGLY; can cause stretch marks, Disp: 90 g, Rfl: 3 .  cetirizine (ZYRTEC) 10 MG tablet, Take 1 tablet (10 mg total) by mouth daily., Disp: 90 tablet, Rfl: 3 .  montelukast (SINGULAIR) 10 MG tablet, TAKE 1 TABLET BY MOUTH EVERYDAY AT BEDTIME, Disp: 90 tablet, Rfl: 3 .  pantoprazole (PROTONIX) 40 MG tablet, Take 1 tablet (40 mg total) by mouth 2 (two) times daily., Disp: 180 tablet, Rfl: 2 .  sertraline (ZOLOFT) 50 MG tablet, Take 1 tablet (50 mg total) by mouth daily., Disp: 90 tablet, Rfl: 3 .  triamcinolone cream (KENALOG) 0.1 %, Apply 1 application topically 2 (two) times daily. If needed (weaker strength), Disp: 453.6 g, Rfl: 3 .  Turmeric 500 MG TABS, Take 500 mg by mouth daily., Disp:  , Rfl:  .  valACYclovir (VALTREX) 500 MG tablet, Take 1 tablet (500 mg total) by mouth 2 (two) times daily as needed (take at sx onset for cold sore/canker sore x 5 d)., Disp: 20 tablet, Rfl: 5   No Known Allergies   Social History   Tobacco Use  . Smoking status: Former Smoker    Packs/day: 1.00    Years: 15.00    Pack years: 15.00    Types: Cigarettes    Quit date: 12/23/2007    Years since quitting: 12.4  . Smokeless tobacco: Former Neurosurgeon    Types: Chew    Quit date: 2009  Vaping Use  . Vaping Use: Never used  Substance Use Topics  . Alcohol use: No  . Drug use: No      Chart Review Today: I personally reviewed active problem list, medication list, allergies, family history, social history, health maintenance, notes from last encounter, lab results, imaging with the patient/caregiver today.   Review of Systems  Constitutional: Negative.   HENT: Negative.   Eyes: Negative.   Respiratory: Negative.  Negative for cough, chest tightness, shortness of breath and wheezing.   Cardiovascular: Negative.  Negative for palpitations and leg swelling.  Gastrointestinal: Negative.  Negative for abdominal distention, abdominal pain, anal bleeding, blood in stool, constipation, diarrhea, nausea and vomiting.  Endocrine: Negative.   Genitourinary: Negative.   Musculoskeletal:  Negative.   Skin: Negative.  Negative for color change.  Allergic/Immunologic: Negative.   Neurological: Negative.   Hematological: Negative.   Psychiatric/Behavioral: Negative.   All other systems reviewed and are negative.  10 Systems reviewed and are negative for acute change except as noted in the HPI.     Objective:   Vitals:   06/04/20 1028  BP: 120/72  Pulse: 72  Resp: 16  Temp: 97.9 F (36.6 C)  TempSrc: Temporal  SpO2: 98%  Weight: 225 lb 11.2 oz (102.4 kg)  Height: 5' 10.5" (1.791 m)    Body mass index is 31.93 kg/m.  Physical Exam Vitals and nursing note reviewed.    Constitutional:      General: He is not in acute distress.    Appearance: Normal appearance. He is well-developed. He is obese. He is not ill-appearing, toxic-appearing or diaphoretic.  HENT:     Head: Normocephalic and atraumatic.     Right Ear: External ear normal.     Left Ear: External ear normal.  Eyes:     General: No scleral icterus.       Right eye: No discharge.        Left eye: No discharge.     Conjunctiva/sclera: Conjunctivae normal.  Neck:     Trachea: No tracheal deviation.  Cardiovascular:     Rate and Rhythm: Normal rate and regular rhythm.     Pulses: Normal pulses.     Heart sounds: Normal heart sounds. No murmur heard.  No friction rub. No gallop.   Pulmonary:     Effort: Pulmonary effort is normal. No respiratory distress.     Breath sounds: Normal breath sounds. No stridor. No wheezing, rhonchi or rales.  Chest:     Chest wall: No mass, deformity, swelling, tenderness, crepitus or edema. There is no dullness to percussion.  Abdominal:     General: Bowel sounds are normal. There is no distension.     Palpations: Abdomen is soft.     Tenderness: There is no abdominal tenderness. There is no right CVA tenderness, left CVA tenderness, guarding or rebound.     Hernia: No hernia is present.  Musculoskeletal:     Lumbar back: Normal range of motion.  Skin:    General: Skin is warm and dry.     Findings: No rash.  Neurological:     Mental Status: He is alert.     Motor: No weakness or abnormal muscle tone.     Coordination: Coordination normal.     Gait: Gait normal.  Psychiatric:        Mood and Affect: Mood normal.        Behavior: Behavior normal.      Results for orders placed or performed in visit on 02/18/20  CBC with Differential/Platelet  Result Value Ref Range   WBC 7.2 3.8 - 10.8 Thousand/uL   RBC 4.71 4.20 - 5.80 Million/uL   Hemoglobin 13.9 13.2 - 17.1 g/dL   HCT 42.3 38 - 50 %   MCV 89.8 80.0 - 100.0 fL   MCH 29.5 27.0 - 33.0 pg   MCHC  32.9 32.0 - 36.0 g/dL   RDW 12.0 11.0 - 15.0 %   Platelets 286 140 - 400 Thousand/uL   MPV 10.1 7.5 - 12.5 fL   Neutro Abs 4,284 1,500 - 7,800 cells/uL   Lymphs Abs 1,872 850 - 3,900 cells/uL   Absolute Monocytes 655 200 - 950 cells/uL   Eosinophils Absolute 346 15 -  500 cells/uL   Basophils Absolute 43 0 - 200 cells/uL   Neutrophils Relative % 59.5 %   Total Lymphocyte 26.0 %   Monocytes Relative 9.1 %   Eosinophils Relative 4.8 %   Basophils Relative 0.6 %  COMPLETE METABOLIC PANEL WITH GFR  Result Value Ref Range   Glucose, Bld 88 65 - 99 mg/dL   BUN 14 7 - 25 mg/dL   Creat 0.10 2.72 - 5.36 mg/dL   GFR, Est Non African American 105 > OR = 60 mL/min/1.12m2   GFR, Est African American 122 > OR = 60 mL/min/1.58m2   BUN/Creatinine Ratio NOT APPLICABLE 6 - 22 (calc)   Sodium 140 135 - 146 mmol/L   Potassium 4.8 3.5 - 5.3 mmol/L   Chloride 103 98 - 110 mmol/L   CO2 31 20 - 32 mmol/L   Calcium 10.1 8.6 - 10.3 mg/dL   Total Protein 7.0 6.1 - 8.1 g/dL   Albumin 4.9 3.6 - 5.1 g/dL   Globulin 2.1 1.9 - 3.7 g/dL (calc)   AG Ratio 2.3 1.0 - 2.5 (calc)   Total Bilirubin 0.4 0.2 - 1.2 mg/dL   Alkaline phosphatase (APISO) 68 36 - 130 U/L   AST 22 10 - 40 U/L   ALT 38 9 - 46 U/L  Lipid panel  Result Value Ref Range   Cholesterol 119 <200 mg/dL   HDL 38 (L) > OR = 40 mg/dL   Triglycerides 644 <034 mg/dL   LDL Cholesterol (Calc) 63 mg/dL (calc)   Total CHOL/HDL Ratio 3.1 <5.0 (calc)   Non-HDL Cholesterol (Calc) 81 <742 mg/dL (calc)  Urine cytology ancillary only  Result Value Ref Range   Neisseria Gonorrhea Negative    Chlamydia Negative    Trichomonas Negative    Comment Normal Reference Range Trichomonas - Negative    Comment Normal Reference Ranger Chlamydia - Negative    Comment      Normal Reference Range Neisseria Gonorrhea - Negative       Assessment & Plan:     ICD-10-CM   1. Anterior chest wall pain  R07.89    ttp over xiphoid process area, no nodule, inflammation,  induration, erythema or abnormality palpated, stop poking, apply ice, NSAIDs sparingly  2. Gastroesophageal reflux disease, unspecified whether esophagitis present  K21.9    GERD flare with spicy food, sees GI and on protonix 40 mg BID, he does not want to f/up with GI, feels about the same, he will avoid food triggers  3. Lumbosacral pain  M54.5 Ambulatory referral to Physical Therapy    DG Lumbar Spine Complete   when leaving room pt wanted to talk about chronic low back pain, advised he needs separate appt since our time was already used up   Suspect abd/chest wall pain or swelling was costochondral inflammation, worsened with his frequent poking - advised to avoid doing that, I feel no abnormality and I do not feel any imaging is indicated.  Physical exam unremarkable.   F/up if something is worsening or enlarged  GERD - he was initially endorsing worsening GERD associated with his new pain - I advised f/up with GI if GERD worse or not controlled with protonix, but then he stated that it was only worse with spicy food, and he does not need f/up right now, he just wanted to be reassured his chest wall and reflux issues were not related or were not a hernia - pt reassured.  F/up with GI as needed  With back pain > 1 year imaging would be indicated, pt can start PT, he has not either, usually manages with stretches and OTC meds, he declined muscle relaxers today, would need to be careful with NSAIDs due to GERD He has no red flags today, normal gait, return for appt dedicated to low back pain - discussed with pt at length today - he will get appt in 6 weeks to f/up        Danelle Berry, PA-C 06/04/20 10:42 AM

## 2020-07-16 ENCOUNTER — Ambulatory Visit: Payer: BC Managed Care – PPO | Admitting: Family Medicine

## 2020-08-26 NOTE — Progress Notes (Signed)
Name: MONTGOMERY FAVOR   MRN: [id number]    DOB: Oct 01, 1976   Date:08/27/2020       Progress Note  Chief Complaint  Patient presents with  . Hyperlipidemia  . Gastroesophageal Reflux  . Allergic Rhinitis     Subjective:   Joe Patterson is a 44 y.o. male, presents to clinic for routine f/up  Hyperlipidemia: Currently treated with lipitor 20 mg qd, pt reports good med compliance Last Lipids: Lab Results  Component Value Date   CHOL 119 02/18/2020   HDL 38 (L) 02/18/2020   LDLCALC 63 02/18/2020   TRIG 101 02/18/2020   CHOLHDL 3.1 02/18/2020   - Denies: Chest pain, shortness of breath, myalgias, claudication  GERD: Still severly triggered by coffee Pantoprazole 40 mg BID   Allergies:  Singular and zyrtec, well controlled  (Risks of PPI use were discussed with patient including bone loss, C. Diff diarrhea, pneumonia, infections, CKD, electrolyte abnormalities.  Pt. Verbalizes understanding and chooses to continue the medication.)  Back problems - lost some weight and that seems to help him feel better     Current Outpatient Medications:  .  atorvastatin (LIPITOR) 20 MG tablet, Take 1 tablet (20 mg total) by mouth at bedtime., Disp: 90 tablet, Rfl: 3 .  betamethasone dipropionate 0.05 % cream, Apply topically 2 (two) times daily. Use SPARINGLY; can cause stretch marks, Disp: 90 g, Rfl: 3 .  cetirizine (ZYRTEC) 10 MG tablet, Take 1 tablet (10 mg total) by mouth daily., Disp: 90 tablet, Rfl: 3 .  montelukast (SINGULAIR) 10 MG tablet, TAKE 1 TABLET BY MOUTH EVERYDAY AT BEDTIME, Disp: 90 tablet, Rfl: 3 .  pantoprazole (PROTONIX) 40 MG tablet, Take 1 tablet (40 mg total) by mouth 2 (two) times daily., Disp: 180 tablet, Rfl: 2 .  sertraline (ZOLOFT) 50 MG tablet, Take 1 tablet (50 mg total) by mouth daily., Disp: 90 tablet, Rfl: 3 .  triamcinolone cream (KENALOG) 0.1 %, Apply 1 application topically 2 (two) times daily. If needed (weaker strength), Disp: 453.6 g, Rfl: 3 .   Turmeric 500 MG TABS, Take 500 mg by mouth daily., Disp: , Rfl:  .  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 Hypertriglyceridemia 11/26/2018  . Elevated glucose 11/26/2018  . Fever blister 05/08/2018  . Allergic rhinitis 11/14/2017  . Hyperlipidemia 11/14/2017  . TMJ (temporomandibular joint syndrome) 06/22/2016  . Shoulder pain, bilateral 06/22/2016  . Social anxiety disorder 12/29/2015  . Allergy to perfume 12/29/2015  . History of esophageal stricture 12/29/2015  . Eczema, allergic 12/29/2015  . Gastroesophageal reflux 12/29/2015    Past Surgical History:  Procedure Laterality Date  . APPENDECTOMY  2020  . COLONOSCOPY, ESOPHAGOGASTRODUODENOSCOPY (EGD) AND ESOPHAGEAL DILATION    . ESOPHAGOGASTRODUODENOSCOPY (EGD) WITH PROPOFOL N/A 02/08/2019   Procedure: ESOPHAGOGASTRODUODENOSCOPY (EGD) WITH PROPOFOL;  Surgeon: Wyline Mood, MD;  Location: [facility] ENDOSCOPY;  Service: Gastroenterology;  Laterality: N/A;  . HERNIA REPAIR    . LAPAROSCOPIC APPENDECTOMY N/A 07/27/2019   Procedure: APPENDECTOMY LAPAROSCOPIC;  Surgeon: Leafy Ro, MD;  Location: [facility] ORS;  Service: General;  Laterality: N/A;  . TYMPANOSTOMY TUBE PLACEMENT     as a child    Family History  Problem Relation Age of Onset  . Multiple sclerosis Mother   .  Hearing loss Father   . Cancer Maternal Grandmother        lymphoma  . Heart disease Maternal Grandfather        early 41s  . Lung disease Paternal Grandmother   . Blindness Paternal Grandmother   . Parkinson's disease Paternal Grandfather   . COPD Neg Hx   . Diabetes Neg Hx   . Hypertension Neg Hx   . Stroke Neg Hx   . Colon cancer Neg Hx     Social History   Tobacco Use  . Smoking status: Former Smoker    Packs/day: 1.00    Years: 15.00    Pack years: 15.00      Types: Cigarettes    Quit date: 12/23/2007    Years since quitting: 12.6  . Smokeless tobacco: Former Neurosurgeon    Types: Chew    Quit date: 2009  Vaping Use  . Vaping Use: Never used  Substance Use Topics  . Alcohol use: No  . Drug use: No     No Known Allergies  Health Maintenance  Topic Date Due  . Hepatitis C Screening  Never done  . TETANUS/TDAP  12/22/2025  . INFLUENZA VACCINE  Completed  . COVID-19 Vaccine  Completed  . HIV Screening  Completed    Chart Review Today: I personally reviewed active problem list, medication list, allergies, family history, social history, health maintenance, notes from last encounter, lab results, imaging with the patient/caregiver today.   Review of Systems  10 Systems reviewed and are negative for acute change except as noted in the HPI.  Objective:   Vitals:   08/27/20 0827  BP: 120/78  Pulse: 81  Resp: 18  Temp: (!) 97.4 F (36.3 C)  TempSrc: Oral  SpO2: 99%  Weight: 219 lb 11.2 oz (99.7 kg)  Height: 5\' 11"  (1.803 m)    Body mass index is 30.64 kg/m.  Physical Exam Vitals and nursing note reviewed.  Constitutional:      General: He is not in acute distress.    Appearance: Normal appearance. He is well-developed. He is obese. He is not ill-appearing, toxic-appearing or diaphoretic.     Interventions: Face mask in place.  HENT:     Head: Normocephalic and atraumatic.     Jaw: No trismus.     Right Ear: External ear normal.     Left Ear: External ear normal.  Eyes:     General: Lids are normal. No scleral icterus.       Right eye: No discharge.        Left eye: No discharge.     Conjunctiva/sclera: Conjunctivae normal.  Neck:     Trachea: Trachea and phonation normal. No tracheal deviation.  Cardiovascular:     Rate and Rhythm: Normal rate and regular rhythm.     Pulses: Normal pulses.          Radial pulses are 2+ on the right side and 2+ on the left side.       Posterior tibial pulses are 2+ on the right side  and 2+ on the left side.     Heart sounds: Normal heart sounds. No murmur heard.  No friction rub. No gallop.   Pulmonary:     Effort: Pulmonary effort is normal. No respiratory distress.     Breath sounds: Normal breath sounds. No stridor. No wheezing, rhonchi or rales.  Abdominal:     General: Bowel sounds are normal. There is no distension.  Palpations: Abdomen is soft.  Musculoskeletal:     Right lower leg: No edema.     Left lower leg: No edema.  Skin:    General: Skin is warm and dry.     Coloration: Skin is not jaundiced.     Findings: No rash.     Nails: There is no clubbing.  Neurological:     Mental Status: He is alert. Mental status is at baseline.     Cranial Nerves: No dysarthria or facial asymmetry.     Motor: No tremor or abnormal muscle tone.     Gait: Gait normal.  Psychiatric:        Mood and Affect: Mood normal.        Speech: Speech normal.        Behavior: Behavior normal. Behavior is cooperative.         Assessment & Plan:     ICD-10-CM   1. Mixed hyperlipidemia  E78.2    stable, well controlled, pt compliant with statin, no myalgias SE or concerns, labs will be due in 6 months  2. Hypertriglyceridemia  E78.1    low glycemic index diet, avoid ETOH, labs next OV  3. Gastroesophageal reflux disease with esophagitis, unspecified whether hemorrhage  K21.00    still sx if he drinks coffee, long term use of PPI - he has seen GI, reviewed PPI SE/risks today, currently feel benefit outweighs risks  4. Social anxiety disorder  F40.10    doing well with zoloft  5. Need for influenza vaccination  Z23 Flu Vaccine QUAD 6+ mos PF IM (Fluarix Quad PF)  6. Eczema, allergic  L23.9 triamcinolone cream (KENALOG) 0.1 %   pt asks for refill on large triamcinolone tub - very sensitive skin, no current eczema flare  7. Midline low back pain without sciatica, unspecified chronicity  M54.5    he's working on loosing weight and exercising, seems to improve with activity  and better posture     Return in about 6 months (around 02/24/2021) for Annual Physical, Routine follow-up.   Danelle Berry, PA-C 08/27/20 8:43 AM

## 2020-08-27 ENCOUNTER — Ambulatory Visit: Payer: BC Managed Care – PPO | Admitting: Family Medicine

## 2020-08-27 ENCOUNTER — Encounter: Payer: Self-pay | Admitting: Family Medicine

## 2020-08-27 ENCOUNTER — Other Ambulatory Visit: Payer: Self-pay

## 2020-08-27 VITALS — BP 120/78 | HR 81 | Temp 97.4°F | Resp 18 | Ht 71.0 in | Wt 219.7 lb

## 2020-08-27 DIAGNOSIS — E781 Pure hyperglyceridemia: Secondary | ICD-10-CM | POA: Diagnosis not present

## 2020-08-27 DIAGNOSIS — L239 Allergic contact dermatitis, unspecified cause: Secondary | ICD-10-CM

## 2020-08-27 DIAGNOSIS — K2100 Gastro-esophageal reflux disease with esophagitis, without bleeding: Secondary | ICD-10-CM

## 2020-08-27 DIAGNOSIS — F4010 Social phobia, unspecified: Secondary | ICD-10-CM | POA: Diagnosis not present

## 2020-08-27 DIAGNOSIS — E782 Mixed hyperlipidemia: Secondary | ICD-10-CM | POA: Diagnosis not present

## 2020-08-27 DIAGNOSIS — Z23 Encounter for immunization: Secondary | ICD-10-CM

## 2020-08-27 DIAGNOSIS — M545 Low back pain, unspecified: Secondary | ICD-10-CM

## 2020-08-27 MED ORDER — TRIAMCINOLONE ACETONIDE 0.1 % EX CREA: 1.0000 "application " | TOPICAL_CREAM | Freq: Two times a day (BID) | CUTANEOUS | 3 refills | Status: DC

## 2020-11-04 ENCOUNTER — Other Ambulatory Visit: Payer: Self-pay | Admitting: Family Medicine

## 2020-11-04 DIAGNOSIS — K2100 Gastro-esophageal reflux disease with esophagitis, without bleeding: Secondary | ICD-10-CM

## 2020-11-04 DIAGNOSIS — L239 Allergic contact dermatitis, unspecified cause: Secondary | ICD-10-CM

## 2020-11-08 ENCOUNTER — Other Ambulatory Visit: Payer: Self-pay | Admitting: Family Medicine

## 2020-11-08 DIAGNOSIS — L239 Allergic contact dermatitis, unspecified cause: Secondary | ICD-10-CM

## 2020-12-04 IMAGING — CR DG LUMBAR SPINE COMPLETE 4+V
1 series · 5 of 5 positions shown · non-contrast
Comparison: None.

CLINICAL DATA: Low back pain for several years, no known injury,
initial encounter

EXAM:
LUMBAR SPINE - COMPLETE 4+ VIEW

[Series 1: dg lumbar spine complete 4 +v · 0.14mm/px · 5 of 5 slices shown]
[im 1/5]
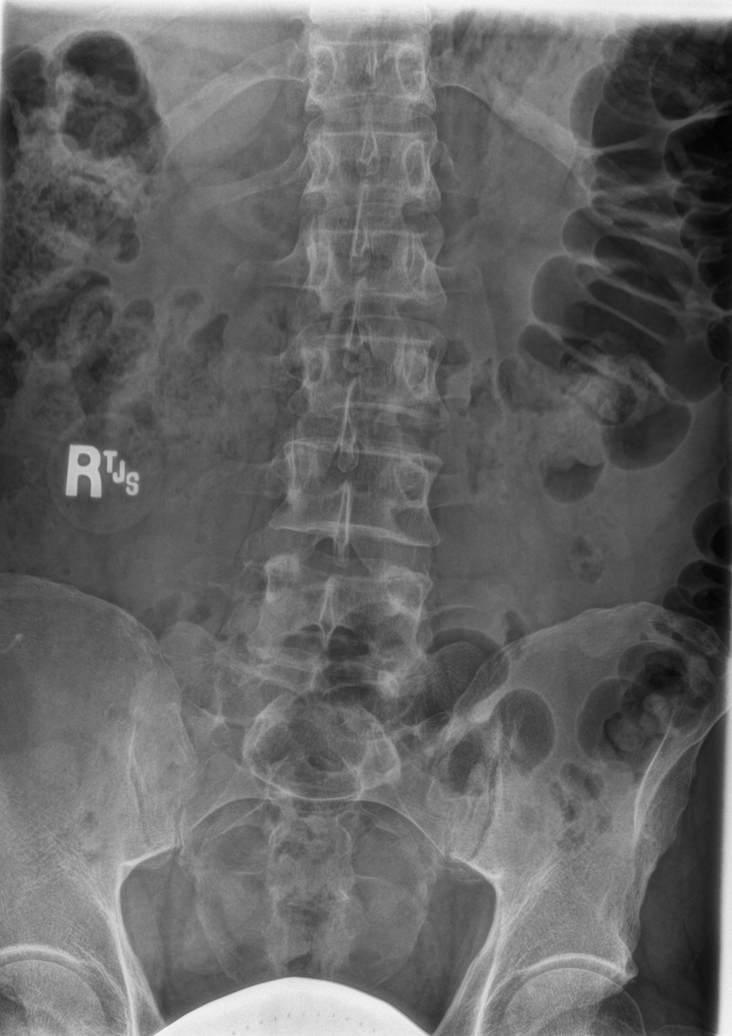
[im 2/5]
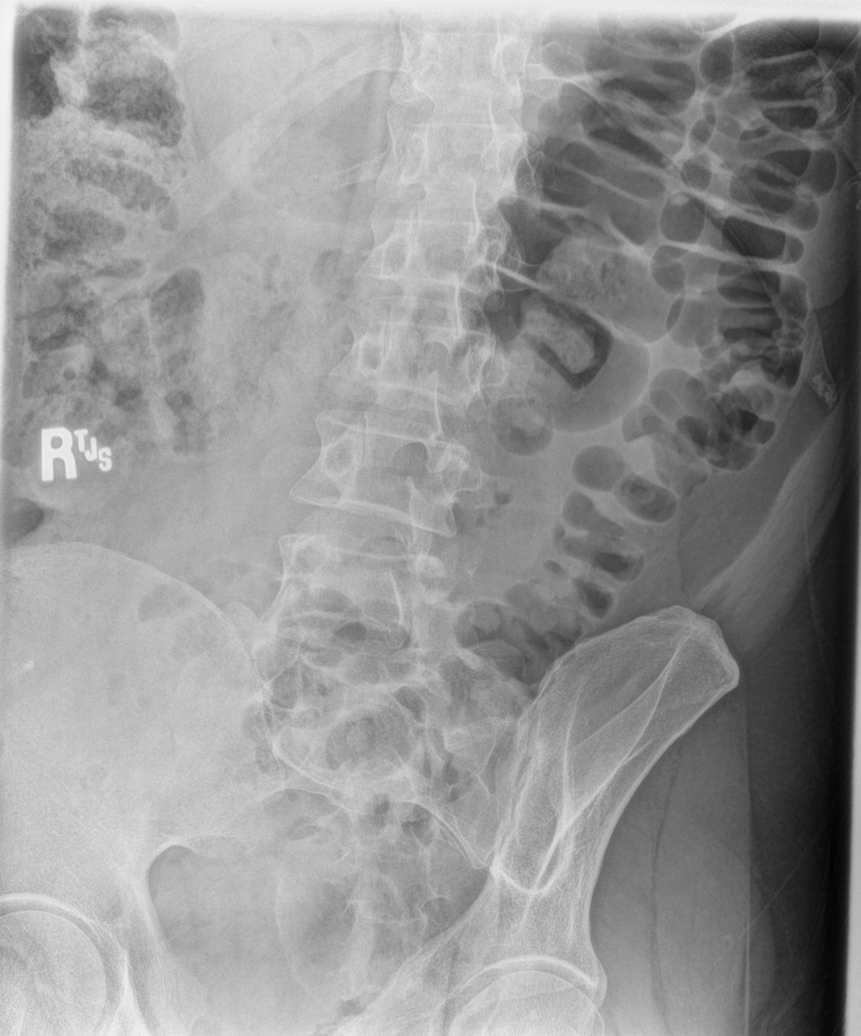
[im 3/5]
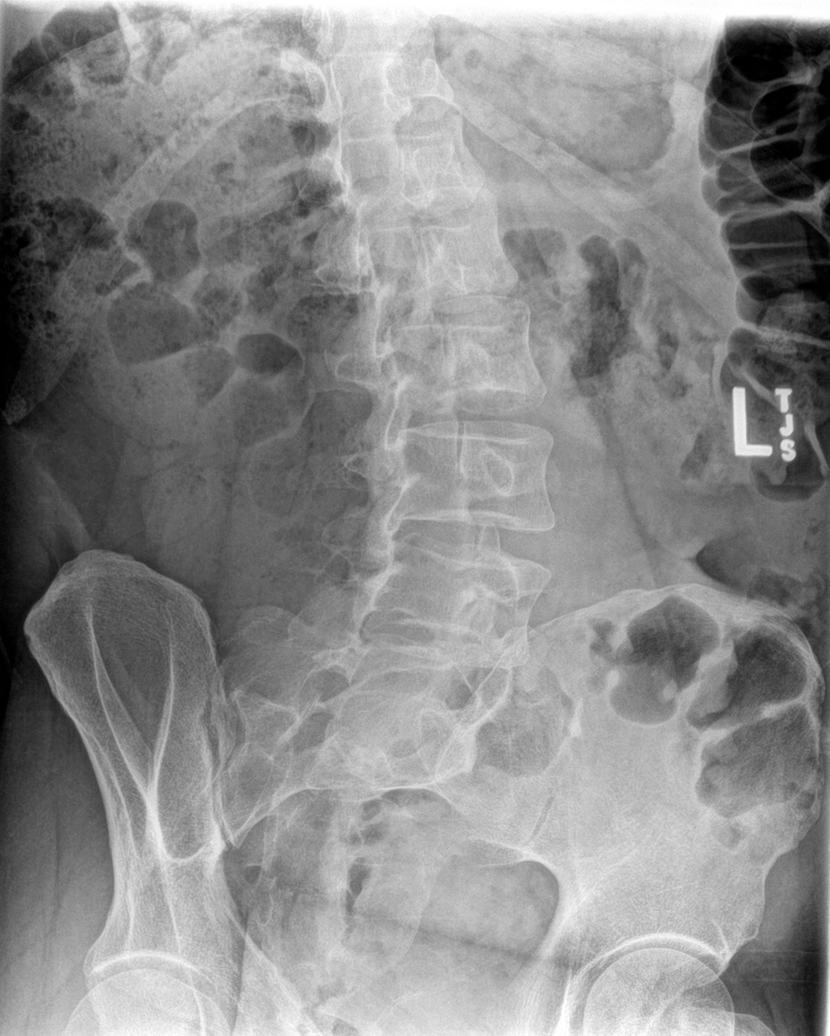
[im 4/5]
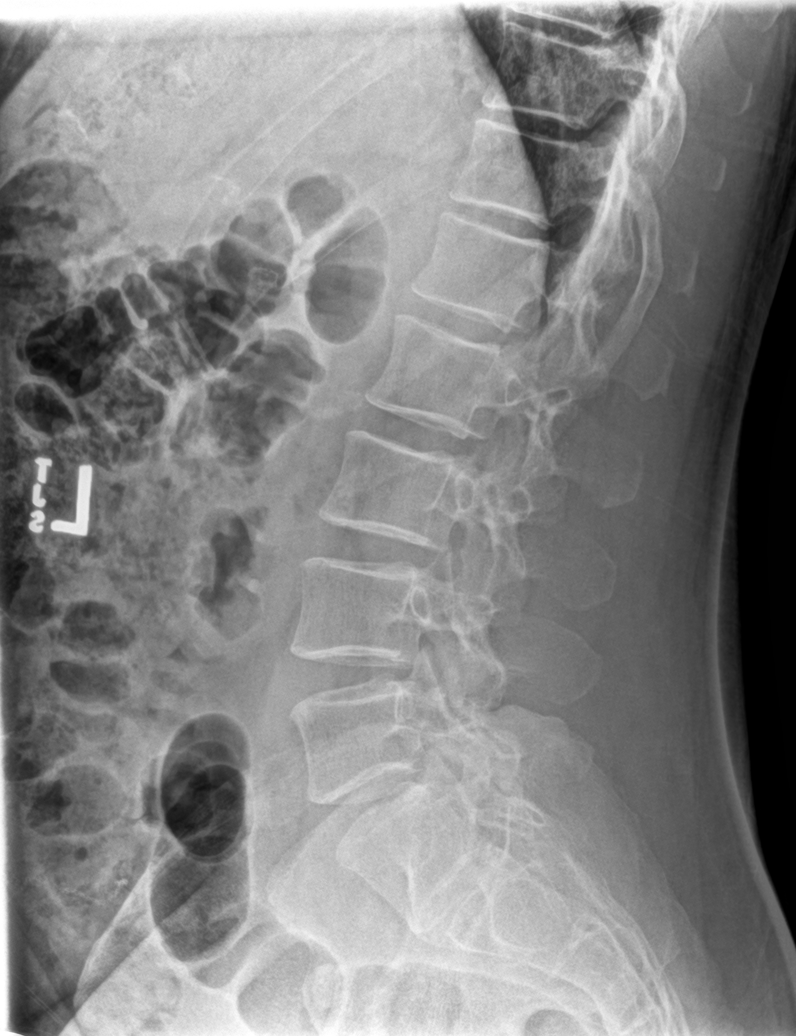
[im 5/5]
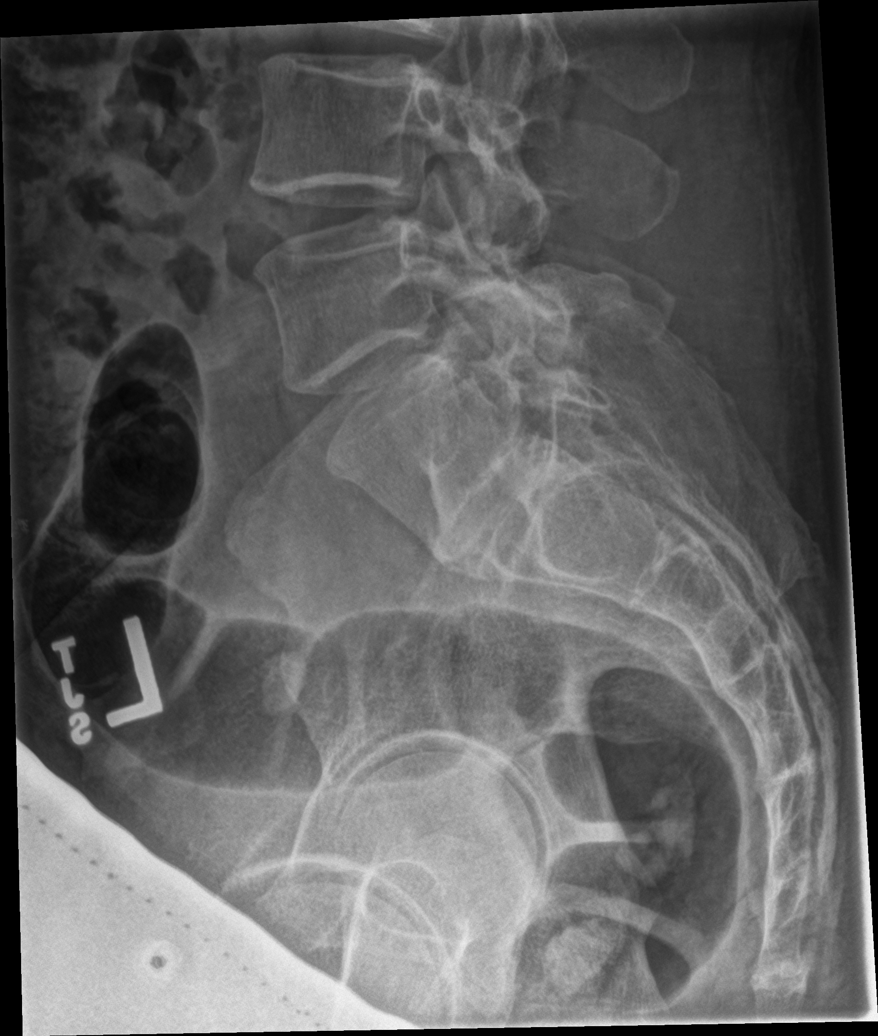

[5 of 5 positions shown; findings below may reference images not displayed]

FINDINGS: Five lumbar type vertebral bodies are well visualized. Vertebral
body height is well maintained. No pars defects are seen. Fifth
lumbar vertebra is partially sacralized. No anterolisthesis is
noted. No soft tissue abnormality is seen.
IMPRESSION: No acute abnormality is noted.

## 2021-02-10 ENCOUNTER — Other Ambulatory Visit: Payer: Self-pay | Admitting: Family Medicine

## 2021-02-10 DIAGNOSIS — E782 Mixed hyperlipidemia: Secondary | ICD-10-CM

## 2021-02-10 DIAGNOSIS — J301 Allergic rhinitis due to pollen: Secondary | ICD-10-CM

## 2021-02-10 DIAGNOSIS — F4010 Social phobia, unspecified: Secondary | ICD-10-CM

## 2021-02-13 ENCOUNTER — Other Ambulatory Visit: Payer: Self-pay | Admitting: Family Medicine

## 2021-02-13 DIAGNOSIS — L239 Allergic contact dermatitis, unspecified cause: Secondary | ICD-10-CM

## 2021-02-17 ENCOUNTER — Telehealth: Payer: Self-pay | Admitting: Family Medicine

## 2021-02-17 NOTE — Telephone Encounter (Signed)
Pt is calling and he did not pick up #30 of each medications. Pt has an appt on 02-26-2021 and pt would like 90 day supply  Of atorvastatin 20 mg ,sertraline 50 mg and singulair 10. To be sent to [facility] [address] in [place] phone number [phone]. Pt will be starting new job

## 2021-02-18 ENCOUNTER — Other Ambulatory Visit: Payer: Self-pay

## 2021-02-18 DIAGNOSIS — J301 Allergic rhinitis due to pollen: Secondary | ICD-10-CM

## 2021-02-18 DIAGNOSIS — E782 Mixed hyperlipidemia: Secondary | ICD-10-CM

## 2021-02-18 DIAGNOSIS — F4010 Social phobia, unspecified: Secondary | ICD-10-CM

## 2021-02-18 MED ORDER — ATORVASTATIN CALCIUM 20 MG PO TABS: ORAL_TABLET | ORAL | 0 refills | Status: DC

## 2021-02-18 MED ORDER — MONTELUKAST SODIUM 10 MG PO TABS: 10.0000 mg | ORAL_TABLET | Freq: Every day | ORAL | 0 refills | Status: DC

## 2021-02-18 MED ORDER — SERTRALINE HCL 50 MG PO TABS: 50.0000 mg | ORAL_TABLET | Freq: Every day | ORAL | 0 refills | Status: DC

## 2021-02-26 ENCOUNTER — Other Ambulatory Visit: Payer: Self-pay

## 2021-02-26 ENCOUNTER — Ambulatory Visit (INDEPENDENT_AMBULATORY_CARE_PROVIDER_SITE_OTHER): Payer: 59 | Admitting: Family Medicine

## 2021-02-26 ENCOUNTER — Encounter: Payer: Self-pay | Admitting: Family Medicine

## 2021-02-26 VITALS — BP 118/82 | HR 74 | Temp 98.1°F | Resp 16 | Ht 71.0 in | Wt 229.3 lb

## 2021-02-26 DIAGNOSIS — K2100 Gastro-esophageal reflux disease with esophagitis, without bleeding: Secondary | ICD-10-CM

## 2021-02-26 DIAGNOSIS — Z1159 Encounter for screening for other viral diseases: Secondary | ICD-10-CM

## 2021-02-26 DIAGNOSIS — F4010 Social phobia, unspecified: Secondary | ICD-10-CM | POA: Diagnosis not present

## 2021-02-26 DIAGNOSIS — E782 Mixed hyperlipidemia: Secondary | ICD-10-CM

## 2021-02-26 DIAGNOSIS — Z8719 Personal history of other diseases of the digestive system: Secondary | ICD-10-CM

## 2021-02-26 DIAGNOSIS — Z5181 Encounter for therapeutic drug level monitoring: Secondary | ICD-10-CM

## 2021-02-26 DIAGNOSIS — J301 Allergic rhinitis due to pollen: Secondary | ICD-10-CM

## 2021-02-26 DIAGNOSIS — B001 Herpesviral vesicular dermatitis: Secondary | ICD-10-CM

## 2021-02-26 DIAGNOSIS — L239 Allergic contact dermatitis, unspecified cause: Secondary | ICD-10-CM

## 2021-02-26 MED ORDER — CETIRIZINE HCL 10 MG PO TABS: 10.0000 mg | ORAL_TABLET | Freq: Every day | ORAL | 3 refills | Status: DC

## 2021-02-26 MED ORDER — MONTELUKAST SODIUM 10 MG PO TABS: 10.0000 mg | ORAL_TABLET | Freq: Every day | ORAL | 3 refills | Status: DC

## 2021-02-26 MED ORDER — VALACYCLOVIR HCL 500 MG PO TABS: 500.0000 mg | ORAL_TABLET | Freq: Two times a day (BID) | ORAL | 5 refills | Status: DC | PRN

## 2021-02-26 MED ORDER — ATORVASTATIN CALCIUM 20 MG PO TABS: ORAL_TABLET | ORAL | 3 refills | Status: DC

## 2021-02-26 MED ORDER — PANTOPRAZOLE SODIUM 40 MG PO TBEC: 40.0000 mg | DELAYED_RELEASE_TABLET | Freq: Two times a day (BID) | ORAL | 3 refills | Status: DC

## 2021-02-26 MED ORDER — SERTRALINE HCL 50 MG PO TABS: 50.0000 mg | ORAL_TABLET | Freq: Every day | ORAL | 3 refills | Status: DC

## 2021-02-26 MED ORDER — BETAMETHASONE DIPROPIONATE 0.05 % EX CREA: TOPICAL_CREAM | Freq: Two times a day (BID) | CUTANEOUS | 2 refills | Status: DC

## 2021-02-26 NOTE — Progress Notes (Signed)
Name: Joe Patterson   MRN: [id number]    DOB: 12/22/1975   Date:02/26/2021       Progress Note  Chief Complaint  Patient presents with  . Follow-up    6 months  . Hyperlipidemia  . Depression  . Gastroesophageal Reflux  . Allergic Rhinitis      Subjective:   Joe Patterson is a 45 y.o. male, presents to clinic for routine f/up  Allergies on singulair, zyrtec  GERD on protonix 40 mg  Anxiety/depression - on zoloft 50 mg daily [facility] 2[facility]9 02/26/2021 08/27/2020 06/04/2020  Decreased Interest 0 0 0  Down, Depressed, Hopeless 0 0 0  PHQ - 2 Score 0 0 0  Altered sleeping 0 - 0  Tired, decreased energy 0 - 0  Change in appetite 0 - 0  Feeling bad or failure about yourself  0 - 0  Trouble concentrating 0 - 0  Moving slowly or fidgety/restless 0 - 0  Suicidal thoughts 0 - 0  PHQ-9 Score 0 - 0  Difficult doing work/chores Not difficult at all - Not difficult at all  Some recent data might be hidden   GAD 7 : Generalized Anxiety Score 02/26/2021 05/08/2018  Nervous, Anxious, on Edge 0 0  Control/stop worrying 0 0  Worry too much - different things 0 0  Trouble relaxing 0 0  Restless 0 0  Easily annoyed or irritable 0 0  Afraid - awful might happen 0 0  Total GAD 7 Score 0 0  Anxiety Difficulty Not difficult at all Not difficult at all    Hyperlipidemia: Currently treated with lipitor 20 mg, pt reports good med compliance Last Lipids: Lab Results  Component Value Date   CHOL 119 02/18/2020   HDL 38 (L) 02/18/2020   LDLCALC 63 02/18/2020   TRIG 101 02/18/2020   CHOLHDL 3.1 02/18/2020   - Denies: Chest pain, shortness of breath, myalgias, claudication Annual labs due today  Hx of herpes on valtrex    Current Outpatient Medications:  .  atorvastatin (LIPITOR) 20 MG tablet, TAKE 1 TABLET BY MOUTH EVERYDAY AT BEDTIME, Disp: 90 tablet, Rfl: 0 .  betamethasone dipropionate 0.05 % cream, APPLY TOPICALLY 2 (TWO) TIMES DAILY. USE SPARINGLY CAN CAUSE  STRETCH MARKS, Disp: 90 g, Rfl: 0 .  cetirizine (ZYRTEC) 10 MG tablet, Take 1 tablet (10 mg total) by mouth daily., Disp: 90 tablet, Rfl: 3 .  montelukast (SINGULAIR) 10 MG tablet, Take 1 tablet (10 mg total) by mouth at bedtime., Disp: 90 tablet, Rfl: 0 .  pantoprazole (PROTONIX) 40 MG tablet, TAKE 1 TABLET BY MOUTH TWICE A DAY, Disp: 180 tablet, Rfl: 1 .  sertraline (ZOLOFT) 50 MG tablet, Take 1 tablet (50 mg total) by mouth daily., Disp: 90 tablet, Rfl: 0 .  triamcinolone cream (KENALOG) 0.1 %, Apply 1 application topically 2 (two) times daily. If needed (weaker strength), Disp: 453.6 g, Rfl: 3 .  Turmeric 500 MG TABS, Take 500 mg by mouth daily., Disp: , Rfl:  .  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 Hypertriglyceridemia 11/26/2018  . Elevated glucose 11/26/2018  . Fever blister 05/08/2018  . Allergic rhinitis 11/14/2017  . Hyperlipidemia 11/14/2017  . TMJ (temporomandibular joint syndrome) 06/22/2016  . Shoulder pain,  bilateral 06/22/2016  . Social anxiety disorder 12/29/2015  . Allergy to perfume 12/29/2015  . History of esophageal stricture 12/29/2015  . Eczema, allergic 12/29/2015  . Gastroesophageal reflux 12/29/2015    Past Surgical History:  Procedure Laterality Date  . APPENDECTOMY  2020  . COLONOSCOPY, ESOPHAGOGASTRODUODENOSCOPY (EGD) AND ESOPHAGEAL DILATION    . ESOPHAGOGASTRODUODENOSCOPY (EGD) WITH PROPOFOL N/A 02/08/2019   Procedure: ESOPHAGOGASTRODUODENOSCOPY (EGD) WITH PROPOFOL;  Surgeon: Wyline Mood, MD;  Location: [facility] ENDOSCOPY;  Service: Gastroenterology;  Laterality: N/A;  . HERNIA REPAIR    . LAPAROSCOPIC APPENDECTOMY N/A 07/27/2019   Procedure: APPENDECTOMY LAPAROSCOPIC;  Surgeon: Leafy Ro, MD;  Location: [facility] ORS;  Service: General;  Laterality: N/A;  .  TYMPANOSTOMY TUBE PLACEMENT     as a child    Family History  Problem Relation Age of Onset  . Multiple sclerosis Mother   . Hearing loss Father   . Cancer Maternal Grandmother        lymphoma  . Heart disease Maternal Grandfather        early 15s  . Lung disease Paternal Grandmother   . Blindness Paternal Grandmother   . Parkinson's disease Paternal Grandfather   . COPD Neg Hx   . Diabetes Neg Hx   . Hypertension Neg Hx   . Stroke Neg Hx   . Colon cancer Neg Hx     Social History   Tobacco Use  . Smoking status: Former Smoker    Packs/day: 1.00    Years: 15.00    Pack years: 15.00    Types: Cigarettes    Quit date: 12/23/2007    Years since quitting: 13.1  . Smokeless tobacco: Former Neurosurgeon    Types: Chew    Quit date: 2009  Vaping Use  . Vaping Use: Never used  Substance Use Topics  . Alcohol use: No  . Drug use: No     No Known Allergies  Health Maintenance  Topic Date Due  . Hepatitis C Screening  Never done  . COVID-19 Vaccine (3 - Booster for Moderna series) 08/14/2020  . TETANUS/TDAP  12/22/2025  . INFLUENZA VACCINE  Completed  . HIV Screening  Completed  . HPV VACCINES  Aged Out    Chart Review Today: I personally reviewed active problem list, medication list, allergies, family history, social history, health maintenance, notes from last encounter, lab results, imaging with the patient/caregiver today.   Review of Systems  10 Systems reviewed and are negative for acute change except as noted in the HPI.  Objective:   Vitals:   02/26/21 0837  BP: 118/82  Pulse: 74  Resp: 16  Temp: 98.1 F (36.7 C)  SpO2: 98%  Weight: 229 lb 4.8 oz (104 kg)  Height: 5\' 11"  (1.803 m)    Body mass index is 31.98 kg/m.  Physical Exam Vitals and nursing note reviewed.  Constitutional:      General: He is not in acute distress.    Appearance: Normal appearance. He is well-developed. He is not ill-appearing, toxic-appearing or diaphoretic.      Interventions: Face mask in place.  HENT:     Head: Normocephalic and atraumatic.     Jaw: No trismus.     Right Ear: External ear normal.     Left Ear: External ear normal.  Eyes:     General: Lids are normal. No scleral icterus.       Right eye: No discharge.        Left eye: No discharge.  Conjunctiva/sclera: Conjunctivae normal.  Neck:     Trachea: Trachea and phonation normal. No tracheal deviation.  Cardiovascular:     Rate and Rhythm: Normal rate and regular rhythm.     Pulses: Normal pulses.          Radial pulses are 2+ on the right side and 2+ on the left side.       Posterior tibial pulses are 2+ on the right side and 2+ on the left side.     Heart sounds: Normal heart sounds. No murmur heard. No friction rub. No gallop.   Pulmonary:     Effort: Pulmonary effort is normal. No respiratory distress.     Breath sounds: Normal breath sounds. No stridor. No wheezing, rhonchi or rales.  Abdominal:     General: Bowel sounds are normal. There is no distension.     Palpations: Abdomen is soft.  Musculoskeletal:     Right lower leg: No edema.     Left lower leg: No edema.  Skin:    General: Skin is warm and dry.     Coloration: Skin is not jaundiced.     Findings: No rash.     Nails: There is no clubbing.  Neurological:     Mental Status: He is alert. Mental status is at baseline.     Cranial Nerves: No dysarthria or facial asymmetry.     Motor: No tremor or abnormal muscle tone.     Gait: Gait normal.  Psychiatric:        Mood and Affect: Mood normal.        Speech: Speech normal.        Behavior: Behavior normal. Behavior is cooperative.         Assessment & Plan:   1. Encounter for hepatitis C screening test for low risk patient  - Hepatitis C Antibody  2. Mixed hyperlipidemia Good statin compliance, due for labs today, no myalgias side effects or concerns, no chest pain or claudication - Lipid panel - atorvastatin (LIPITOR) 20 MG tablet; TAKE 1 TABLET  BY MOUTH EVERYDAY AT BEDTIME  Dispense: 90 tablet; Refill: 3  3. Gastroesophageal reflux disease with esophagitis, unspecified whether hemorrhage Symptoms well controlled with Protonix twice daily, monitoring with GI - pantoprazole (PROTONIX) 40 MG tablet; Take 1 tablet (40 mg total) by mouth 2 (two) times daily.  Dispense: 180 tablet; Refill: 3  4. Social anxiety disorder Patient reports his anxiety symptoms are well controlled with current dose of sertraline - sertraline (ZOLOFT) 50 MG tablet; Take 1 tablet (50 mg total) by mouth daily.  Dispense: 90 tablet; Refill: 3  5. Medication monitoring encounter - COMPLETE METABOLIC PANEL WITH GFR  6. Eczema, allergic Med refill - betamethasone dipropionate 0.05 % cream; Apply topically 2 (two) times daily. Use SPARINGLY; can cause stretch marks  Dispense: 90 g; Refill: 2  7. History of esophageal stricture Indication for long term PPI use - pt sees GI - pantoprazole (PROTONIX) 40 MG tablet; Take 1 tablet (40 mg total) by mouth 2 (two) times daily.  Dispense: 180 tablet; Refill: 3  8. Seasonal allergic rhinitis due to pollen - cetirizine (ZYRTEC) 10 MG tablet; Take 1 tablet (10 mg total) by mouth daily.  Dispense: 90 tablet; Refill: 3 - montelukast (SINGULAIR) 10 MG tablet; Take 1 tablet (10 mg total) by mouth at bedtime.  Dispense: 90 tablet; Refill: 3  9. Fever blister Well controlled with abortive dosing of valtrex - valACYclovir (VALTREX) 500 MG tablet; Take 1  tablet (500 mg total) by mouth 2 (two) times daily as needed (take at sx onset for cold sore/canker sore x 5 d).  Dispense: 20 tablet; Refill: 5     Danelle Berry, PA-C 02/26/21 8:49 AM

## 2021-03-01 LAB — LIPID PANEL
Cholesterol: 126 mg/dL (ref <200)
HDL: 44 mg/dL (ref >=40)
LDL Cholesterol (Calc): 63 mg/dL (calc)
Non-HDL Cholesterol (Calc): 82 mg/dL (calc) (ref <130)
Total CHOL/HDL Ratio: 2.9 (calc) (ref <5.0)
Triglycerides: 107 mg/dL (ref <150)

## 2021-03-01 LAB — COMPLETE METABOLIC PANEL WITH GFR
AG Ratio: 2.5 (calc) (ref 1.0–2.5)
ALT: 22 U/L (ref 9–46)
AST: 19 U/L (ref 10–40)
Albumin: 4.7 g/dL (ref 3.6–5.1)
Alkaline phosphatase (APISO): 50 U/L (ref 36–130)
BUN: 16 mg/dL (ref 7–25)
CO2: 27 mmol/L (ref 20–32)
Calcium: 9.5 mg/dL (ref 8.6–10.3)
Chloride: 105 mmol/L (ref 98–110)
Creat: 0.95 mg/dL (ref 0.60–1.35)
GFR, Est African American: 113 mL/min/{1.73_m2} (ref >=60)
GFR, Est Non African American: 98 mL/min/{1.73_m2} (ref >=60)
Globulin: 1.9 g/dL (calc) (ref 1.9–3.7)
Glucose, Bld: 90 mg/dL (ref 65–99)
Potassium: 4.2 mmol/L (ref 3.5–5.3)
Sodium: 141 mmol/L (ref 135–146)
Total Bilirubin: 0.3 mg/dL (ref 0.2–1.2)
Total Protein: 6.6 g/dL (ref 6.1–8.1)

## 2021-03-01 LAB — HEPATITIS C ANTIBODY
Hepatitis C Ab: NONREACTIVE
SIGNAL TO CUT-OFF: 0 (ref <1.00)

## 2021-03-11 ENCOUNTER — Encounter: Payer: Self-pay | Admitting: Family Medicine

## 2021-04-21 ENCOUNTER — Other Ambulatory Visit: Payer: Self-pay | Admitting: Family Medicine

## 2021-04-21 DIAGNOSIS — K2100 Gastro-esophageal reflux disease with esophagitis, without bleeding: Secondary | ICD-10-CM

## 2021-04-21 DIAGNOSIS — Z8719 Personal history of other diseases of the digestive system: Secondary | ICD-10-CM

## 2021-10-18 ENCOUNTER — Other Ambulatory Visit: Payer: Self-pay

## 2021-10-18 ENCOUNTER — Encounter: Payer: Self-pay | Admitting: Family Medicine

## 2021-10-18 ENCOUNTER — Ambulatory Visit: Payer: BC Managed Care – PPO | Admitting: Family Medicine

## 2021-10-18 VITALS — BP 130/76 | HR 83 | Temp 97.7°F | Resp 18 | Ht 71.0 in | Wt 231.4 lb

## 2021-10-18 DIAGNOSIS — R112 Nausea with vomiting, unspecified: Secondary | ICD-10-CM

## 2021-10-18 NOTE — Patient Instructions (Signed)
It was great to see you!  Our plans for today:  - Continue to take it easy the next few days. See tips below for a bland diet. - Come back to see [place] if your symptoms return or worsen.   Take care and seek immediate care sooner if you develop any concerns.   Dr. Felipa Eth Diet A bland diet consists of foods that are often soft and do not have a lot of fat, fiber, or extra seasonings. Foods without fat, fiber, or seasoning are easier for the body to digest. They are also less likely to irritate your mouth, throat, stomach, and other parts of your digestive system. A bland diet is sometimes called a BRAT diet. What is my plan? Your health care provider or food and nutrition specialist (dietitian) may recommend specific changes to your diet to prevent symptoms or to treat your symptoms. These changes may include: Eating small meals often. Cooking food until it is soft enough to chew easily. Chewing your food well. Drinking fluids slowly. Not eating foods that are very spicy, sour, or fatty. Not eating citrus fruits, such as oranges and grapefruit. What do I need to know about this diet? Eat a variety of foods from the bland diet food list. Do not follow a bland diet longer than needed. Ask your health care provider whether you should take vitamins or supplements. What foods can I eat? Grains Hot cereals, such as cream of wheat. Rice. Bread, crackers, or tortillas made from refined white flour. Vegetables Canned or cooked vegetables. Mashed or boiled potatoes. Fruits Bananas. Applesauce. Other types of cooked or canned fruit with the skin and seeds removed, such as canned peaches or pears. Meats and other proteins Scrambled eggs. Creamy peanut butter or other nut butters. Lean, well-cooked meats, such as chicken or fish. Tofu. Soups or broths. Dairy Low-fat dairy products, such as milk, cottage cheese, or yogurt. Beverages Water. Herbal tea. Apple juice. Fats and oils Mild salad  dressings. Canola or olive oil. Sweets and desserts Pudding. Custard. Fruit gelatin. Ice cream. The items listed above may not be a complete list of recommended foods and beverages. Contact a dietitian for more options. What foods are not recommended? Grains Whole grain breads and cereals. Vegetables Raw vegetables. Fruits Raw fruits, especially citrus, berries, or dried fruits. Dairy Whole fat dairy foods. Beverages Caffeinated drinks. Alcohol. Seasonings and condiments Strongly flavored seasonings or condiments. Hot sauce. Salsa. Other foods Spicy foods. Fried foods. Sour foods, such as pickled or fermented foods. Foods with high sugar content. Foods high in fiber. The items listed above may not be a complete list of foods and beverages to avoid. Contact a dietitian for more information. Summary A bland diet consists of foods that are often soft and do not have a lot of fat, fiber, or extra seasonings. Foods without fat, fiber, or seasoning are easier for the body to digest. Check with your health care provider to see how long you should follow this diet plan. It is not meant to be followed for long periods. This information is not intended to replace advice given to you by your health care provider. Make sure you discuss any questions you have with your health care provider. Document Revised: 12/27/2017 Document Reviewed: 12/27/2017 Elsevier Patient Education  2022 [employer].

## 2021-10-18 NOTE — Progress Notes (Signed)
   SUBJECTIVE:   CHIEF COMPLAINT / HPI:   ABDOMINAL ISSUES - h/o esophageal stricture and GERD, on PPI, follows with GI - nausea and vomiting yesterday. Just one episode of vomiting.  - no new or suspicious foods.  - no abdominal pain.   Duration:  1 day Radiation: no Episode duration: Frequency: once Alleviating factors: unknown Aggravating factors: unknown Treatments attempted: none and PPI Constipation: no Diarrhea: no Episodes of diarrhea/day: 0 Mucous in the stool: no Heartburn:  no symptoms, compliant with PPI Nausea: yes Vomiting: yes Episodes of vomit/day: Melena or hematochezia: no Rash: no Jaundice: no Fever: no Weight loss: no   OBJECTIVE:   BP 130/76   Pulse 83   Temp 97.7 F (36.5 C) (Oral)   Resp 18   Ht 5\' 11"  (1.803 m)   Wt 231 lb 6.4 oz (105 kg)   SpO2 96%   BMI 32.27 kg/m   Gen: well appearing, in NAD Card: RRR Lungs: CTAB Abd: soft, +BS, NTND. Negative Murphy sign. No organomegaly. Ext: WWP, no edema   ASSESSMENT/PLAN:   Nausea, vomiting Unclear etiology. Likely viral vs food-borne given self limited nature. Needs COVID test for work, swab obtained. Continue conservative measures with bland diet and rest. F/u if symptoms return or worsen.    , DO

## 2021-10-19 LAB — NOVEL CORONAVIRUS, NAA: SARS-CoV-2, NAA: NOT DETECTED

## 2021-10-19 LAB — SPECIMEN STATUS REPORT

## 2021-10-19 LAB — SARS-COV-2, NAA 2 DAY TAT

## 2022-01-12 DIAGNOSIS — S93699A Other sprain of unspecified foot, initial encounter: Secondary | ICD-10-CM | POA: Insufficient documentation

## 2022-01-31 ENCOUNTER — Other Ambulatory Visit: Payer: Self-pay

## 2022-01-31 ENCOUNTER — Encounter: Payer: Self-pay | Admitting: Nurse Practitioner

## 2022-01-31 ENCOUNTER — Ambulatory Visit: Payer: BC Managed Care – PPO | Admitting: Nurse Practitioner

## 2022-01-31 VITALS — BP 124/72 | HR 81 | Temp 97.6°F | Resp 18 | Ht 71.0 in | Wt 220.5 lb

## 2022-01-31 DIAGNOSIS — R197 Diarrhea, unspecified: Secondary | ICD-10-CM

## 2022-01-31 DIAGNOSIS — R1110 Vomiting, unspecified: Secondary | ICD-10-CM | POA: Diagnosis not present

## 2022-01-31 NOTE — Progress Notes (Signed)
° °  BP 124/72    Pulse 81    Temp 97.6 F (36.4 C) (Oral)    Resp 18    Ht 5\' 11"  (1.803 m)    Wt 220 lb 8 oz (100 kg)    SpO2 98%    BMI 30.75 kg/m    Subjective:    Patient ID: , male    DOB: [birth year], 46 y.o.   MRN: [id number]  HPI: Joe Patterson is a 46 y.o. male  Chief Complaint  Patient presents with   Nausea   Vomiting/ diarrhea:  He says it started two days ago.  He says he is doing better today but needed a work note. He denies any fever or abdominal pain.  He said he did have a sour stomach.  He says he was not really able to eat but is back to a regular diet now. Discussed pushing fluids. He would like a covid test just to be sure. He denies any cough, nasal congestion or runny nose.  Will give him a work note.   Relevant past medical, surgical, family and social history reviewed and updated as indicated. Interim medical history since our last visit reviewed. Allergies and medications reviewed and updated.  Review of Systems  Constitutional: Negative for fever or weight change.  Respiratory: Negative for cough and shortness of breath.   Cardiovascular: Negative for chest pain or palpitations.  Gastrointestinal: Negative for abdominal pain, no bowel changes.  Musculoskeletal: Negative for gait problem or joint swelling.  Skin: Negative for rash.  Neurological: Negative for dizziness or headache.  No other specific complaints in a complete review of systems (except as listed in HPI above).      Objective:    BP 124/72    Pulse 81    Temp 97.6 F (36.4 C) (Oral)    Resp 18    Ht 5\' 11"  (1.803 m)    Wt 220 lb 8 oz (100 kg)    SpO2 98%    BMI 30.75 kg/m   Wt Readings from Last 3 Encounters:  01/31/22 220 lb 8 oz (100 kg)  10/18/21 231 lb 6.4 oz (105 kg)  02/26/21 229 lb 4.8 oz (104 kg)    Physical Exam  Constitutional: Patient appears well-developed and well-nourished.  No distress.  HEENT: head atraumatic, normocephalic, pupils equal and reactive  to light, neck supple Cardiovascular: Normal rate, regular rhythm and normal heart sounds.  No murmur heard. No BLE edema. Pulmonary/Chest: Effort normal and breath sounds normal. No respiratory distress. Abdominal: Soft.  There is no tenderness. Psychiatric: Patient has a normal mood and affect. behavior is normal. Judgment and thought content normal.   Results for orders placed or performed in visit on 10/18/21  Novel Coronavirus, NAA ([facility])   Specimen: Nasopharyngeal(NP) swabs in vial transport medium   Nasopharynge  Previous  Result Value Ref Range   SARS-CoV-2, NAA Not Detected Not Detected  SARS-COV-2, NAA 2 DAY TAT   Nasopharynge  Previous  Result Value Ref Range   SARS-CoV-2, NAA 2 DAY TAT Performed   Specimen status report  Result Value Ref Range   specimen status report Comment       Assessment & Plan:   1. Vomiting and diarrhea -symptoms have improved -given work note -keep pushing fluids -increase diet as tolerated -covid test   Follow up plan: Return if symptoms worsen or fail to improve.

## 2022-02-01 LAB — SPECIMEN STATUS REPORT

## 2022-02-01 LAB — NOVEL CORONAVIRUS, NAA: SARS-CoV-2, NAA: NOT DETECTED

## 2022-05-05 ENCOUNTER — Encounter: Payer: Self-pay | Admitting: Family Medicine

## 2022-05-05 ENCOUNTER — Ambulatory Visit: Payer: BC Managed Care – PPO | Admitting: Family Medicine

## 2022-05-05 VITALS — BP 122/70 | HR 83 | Temp 97.8°F | Resp 16 | Ht 71.0 in | Wt 197.1 lb

## 2022-05-05 DIAGNOSIS — Z8719 Personal history of other diseases of the digestive system: Secondary | ICD-10-CM

## 2022-05-05 DIAGNOSIS — F4010 Social phobia, unspecified: Secondary | ICD-10-CM

## 2022-05-05 DIAGNOSIS — B001 Herpesviral vesicular dermatitis: Secondary | ICD-10-CM

## 2022-05-05 DIAGNOSIS — E782 Mixed hyperlipidemia: Secondary | ICD-10-CM | POA: Diagnosis not present

## 2022-05-05 DIAGNOSIS — L239 Allergic contact dermatitis, unspecified cause: Secondary | ICD-10-CM

## 2022-05-05 DIAGNOSIS — Z1211 Encounter for screening for malignant neoplasm of colon: Secondary | ICD-10-CM | POA: Diagnosis not present

## 2022-05-05 DIAGNOSIS — K2100 Gastro-esophageal reflux disease with esophagitis, without bleeding: Secondary | ICD-10-CM

## 2022-05-05 DIAGNOSIS — J301 Allergic rhinitis due to pollen: Secondary | ICD-10-CM

## 2022-05-05 DIAGNOSIS — Z5181 Encounter for therapeutic drug level monitoring: Secondary | ICD-10-CM

## 2022-05-05 DIAGNOSIS — Z9109 Other allergy status, other than to drugs and biological substances: Secondary | ICD-10-CM

## 2022-05-05 MED ORDER — SERTRALINE HCL 50 MG PO TABS: 50.0000 mg | ORAL_TABLET | Freq: Every day | ORAL | 3 refills | Status: DC

## 2022-05-05 MED ORDER — BETAMETHASONE DIPROPIONATE 0.05 % EX CREA: TOPICAL_CREAM | Freq: Two times a day (BID) | CUTANEOUS | 2 refills | Status: DC

## 2022-05-05 MED ORDER — MONTELUKAST SODIUM 10 MG PO TABS: 10.0000 mg | ORAL_TABLET | Freq: Every day | ORAL | 3 refills | Status: DC

## 2022-05-05 MED ORDER — PANTOPRAZOLE SODIUM 40 MG PO TBEC: 40.0000 mg | DELAYED_RELEASE_TABLET | Freq: Two times a day (BID) | ORAL | 3 refills | Status: DC

## 2022-05-05 MED ORDER — VALACYCLOVIR HCL 500 MG PO TABS: 500.0000 mg | ORAL_TABLET | Freq: Two times a day (BID) | ORAL | 5 refills | Status: DC | PRN

## 2022-05-05 MED ORDER — ATORVASTATIN CALCIUM 20 MG PO TABS: ORAL_TABLET | ORAL | 3 refills | Status: DC

## 2022-05-05 NOTE — Progress Notes (Signed)
Name: Joe Patterson   MRN: [id number]    DOB: Mar 05, 1976   Date:05/05/2022       Progress Note  Chief Complaint  Patient presents with   Medication Refill     Subjective:   Joe Patterson is a 46 y.o. male, presents to clinic for routine f/up and med refill  Anxiety on zoloft 50 mg long term, no change to moods, pt feels good, phq 9 reviewed and neg today    05/05/2022    8:27 AM 01/31/2022   10:26 AM 10/18/2021   10:47 AM  Depression screen PHQ 2/9  Decreased Interest 0 0 0  Down, Depressed, Hopeless 0 0 0  PHQ - 2 Score 0 0 0  Altered sleeping 1  0  Tired, decreased energy 1  0  Change in appetite 0  0  Feeling bad or failure about yourself  0  0  Trouble concentrating 0  0  Moving slowly or fidgety/restless 0  0  Suicidal thoughts 0  0  PHQ-9 Score 2  0  Difficult doing work/chores Somewhat difficult  Not difficult at all   Hx of herpes on valtrex- no concerns  Hyperlipidemia: Currently treated with lipitor 20, pt reports good med compliance Last Lipids: Lab Results  Component Value Date   CHOL 126 02/26/2021   HDL 44 02/26/2021   LDLCALC 63 02/26/2021   TRIG 107 02/26/2021   CHOLHDL 2.9 02/26/2021   - Denies: Chest pain, shortness of breath, myalgias, claudication  Allergies: On singulair and zyrtec   GERD:  on long term pantoprazole - prior upper endoscopy, esopgeal stricture, given ok by GI for longterm PPI, a little more contorllerd with healthy diet but still dependent on meds    Current Outpatient Medications:    atorvastatin (LIPITOR) 20 MG tablet, TAKE 1 TABLET BY MOUTH EVERYDAY AT BEDTIME, Disp: 90 tablet, Rfl: 3   betamethasone dipropionate 0.05 % cream, Apply topically 2 (two) times daily. Use SPARINGLY; can cause stretch marks, Disp: 90 g, Rfl: 2   cetirizine (ZYRTEC) 10 MG tablet, Take 1 tablet (10 mg total) by mouth daily., Disp: 90 tablet, Rfl: 3   montelukast (SINGULAIR) 10 MG tablet, Take 1 tablet (10 mg total) by mouth at bedtime.,  Disp: 90 tablet, Rfl: 3   pantoprazole (PROTONIX) 40 MG tablet, Take 1 tablet (40 mg total) by mouth 2 (two) times daily., Disp: 180 tablet, Rfl: 3   sertraline (ZOLOFT) 50 MG tablet, Take 1 tablet (50 mg total) by mouth daily., Disp: 90 tablet, Rfl: 3   Turmeric 500 MG TABS, Take 500 mg by mouth daily., Disp: , Rfl:    valACYclovir (VALTREX) 500 MG tablet, Take 1 tablet (500 mg total) by mouth 2 (two) times daily as needed (take at sx onset for cold sore/canker sore x 5 d)., Disp: 20 tablet, Rfl: 5   triamcinolone cream (KENALOG) 0.1 %, Apply 1 application topically 2 (two) times daily. If needed (weaker strength) (Patient not taking: Reported on 05/05/2022), Disp: 453.6 g, Rfl: 3  Patient Active Problem List   Diagnosis Date Noted   Plantar fascia rupture 01/12/2022   Hypertriglyceridemia 11/26/2018   Elevated glucose 11/26/2018   Fever blister 05/08/2018   Allergic rhinitis 11/14/2017   Hyperlipidemia 11/14/2017   TMJ (temporomandibular joint syndrome) 06/22/2016   Shoulder pain, bilateral 06/22/2016   Social anxiety disorder 12/29/2015   Allergy to perfume 12/29/2015   History of esophageal stricture 12/29/2015   Eczema, allergic 12/29/2015   Gastroesophageal  reflux 12/29/2015    Past Surgical History:  Procedure Laterality Date   APPENDECTOMY  2020   COLONOSCOPY, ESOPHAGOGASTRODUODENOSCOPY (EGD) AND ESOPHAGEAL DILATION     ESOPHAGOGASTRODUODENOSCOPY (EGD) WITH PROPOFOL N/A 02/08/2019   Procedure: ESOPHAGOGASTRODUODENOSCOPY (EGD) WITH PROPOFOL;  Surgeon: Jonathon Bellows, MD;  Location[facility] ENDOSCOPY;  Service: Gastroenterology;  Laterality: N/A;   HERNIA REPAIR     LAPAROSCOPIC APPENDECTOMY N/A 07/27/2019   Procedure: APPENDECTOMY LAPAROSCOPIC;  Surgeon: Jules Husbands, MD;  Location: [facility] ORS;  Service: General;  Laterality: N/A;   TYMPANOSTOMY TUBE PLACEMENT     as a child    Family History  Problem Relation Age of Onset   Multiple sclerosis Mother    Hearing loss Father     Cancer Maternal Grandmother        lymphoma   Heart disease Maternal Grandfather        early 74s   Lung disease Paternal Grandmother    Blindness Paternal Grandmother    Parkinson's disease Paternal Grandfather    COPD Neg Hx    Diabetes Neg Hx    Hypertension Neg Hx    Stroke Neg Hx    Colon cancer Neg Hx     Social History   Tobacco Use   Smoking status: Former    Packs/day: 1.00    Years: 15.00    Pack years: 15.00    Types: Cigarettes    Quit date: 12/23/2007    Years since quitting: 14.3   Smokeless tobacco: Former    Types: Chew    Quit date: 2009  Vaping Use   Vaping Use: Never used  Substance Use Topics   Alcohol use: No   Drug use: No     No Known Allergies  Health Maintenance  Topic Date Due   COLONOSCOPY (Pts 45-46yrs Insurance coverage will need to be confirmed)  Never done   COVID-19 Vaccine (3 - Booster for Moderna series) 05/21/2022 (Originally 04/08/2020)   INFLUENZA VACCINE  07/12/2022   TETANUS/TDAP  12/22/2025   Hepatitis C Screening  Completed   HIV Screening  Completed   HPV VACCINES  Aged Out    Chart Review Today: I personally reviewed active problem list, medication list, allergies, family history, social history, health maintenance, notes from last encounter, lab results, imaging with the patient/caregiver today.   Review of Systems  Constitutional: Negative.   HENT: Negative.    Eyes: Negative.   Respiratory: Negative.    Cardiovascular: Negative.   Gastrointestinal: Negative.   Endocrine: Negative.   Genitourinary: Negative.   Musculoskeletal: Negative.   Skin: Negative.   Allergic/Immunologic: Negative.   Neurological: Negative.   Hematological: Negative.   Psychiatric/Behavioral: Negative.    All other systems reviewed and are negative.   Objective:   Vitals:   05/05/22 0823  BP: 122/70  Pulse: 83  Resp: 16  Temp: 97.8 F (36.6 C)  TempSrc: Oral  SpO2: 97%  Weight: 197 lb 1.6 oz (89.4 kg)  Height: 5\' 11"   (1.803 m)    Body mass index is 27.49 kg/m.  Physical Exam Vitals and nursing note reviewed.  Constitutional:      General: He is not in acute distress.    Appearance: Normal appearance. He is well-developed. He is not ill-appearing, toxic-appearing or diaphoretic.     Interventions: Face mask in place.  HENT:     Head: Normocephalic and atraumatic.     Jaw: No trismus.     Right Ear: External ear normal.  Left Ear: External ear normal.  Eyes:     General: Lids are normal. No scleral icterus.       Right eye: No discharge.        Left eye: No discharge.     Conjunctiva/sclera: Conjunctivae normal.  Neck:     Trachea: Trachea and phonation normal. No tracheal deviation.  Cardiovascular:     Rate and Rhythm: Normal rate and regular rhythm.     Pulses: Normal pulses.          Radial pulses are 2+ on the right side and 2+ on the left side.     Heart sounds: Normal heart sounds. No murmur heard.   No friction rub. No gallop.  Pulmonary:     Effort: Pulmonary effort is normal. No respiratory distress.     Breath sounds: Normal breath sounds. No stridor. No wheezing, rhonchi or rales.  Abdominal:     General: Bowel sounds are normal. There is no distension.     Palpations: Abdomen is soft.  Musculoskeletal:     Right lower leg: No edema.     Left lower leg: No edema.  Skin:    General: Skin is warm and dry.     Coloration: Skin is not jaundiced.     Findings: No rash.     Nails: There is no clubbing.  Neurological:     Mental Status: He is alert. Mental status is at baseline.     Cranial Nerves: No dysarthria or facial asymmetry.     Motor: No tremor or abnormal muscle tone.     Gait: Gait normal.  Psychiatric:        Mood and Affect: Mood normal.        Speech: Speech normal.        Behavior: Behavior normal. Behavior is cooperative.        Assessment & Plan:   Problem List Items Addressed This Visit       Respiratory   Allergic rhinitis    Sx fairly well  controlled, meds refilled       Relevant Medications   montelukast (SINGULAIR) 10 MG tablet     Digestive   Gastroesophageal reflux (Chronic)    Sx if off PPI, prior GI workup, they okayed long term ppi, pt aware of possible SE       Relevant Medications   pantoprazole (PROTONIX) 40 MG tablet     Musculoskeletal and Integument   Eczema, allergic    Currently controlled, using Rx creams and ointments, no recent flares       Relevant Medications   betamethasone dipropionate 0.05 % cream     Other   Social anxiety disorder (Chronic)    Stable sx, well controlled on zoloft       Relevant Medications   sertraline (ZOLOFT) 50 MG tablet   Allergy to perfume    Note given for work training out of town, requires single room/bathroom        History of esophageal stricture    No dysphagia/choking, refill PPI       Relevant Medications   pantoprazole (PROTONIX) 40 MG tablet   Hyperlipidemia    Compliant with meds, no SE, no myalgias, fatigue or jaundice Due for LP and recheck CMP Has been well controlled in the past with lipitor 20 mg daily       Relevant Medications   atorvastatin (LIPITOR) 20 MG tablet   Other Visit Diagnoses     Colon cancer screening    -  Primary   Fever blister       meds refilled   Relevant Medications   valACYclovir (VALTREX) 500 MG tablet   Screening for malignant neoplasm of colon       Relevant Orders   Cologuard   Encounter for medication monitoring       Relevant Orders   CBC with Differential/Platelet (Completed)   COMPLETE METABOLIC PANEL WITH GFR (Completed)   Lipid panel (Completed)        Note for job training done today  Return in about 6 months (around 11/05/2022) for Routine follow-up.   Delsa Grana, PA-C 05/05/22 8:37 AM

## 2022-05-06 ENCOUNTER — Telehealth: Payer: Self-pay

## 2022-05-06 LAB — CBC WITH DIFFERENTIAL/PLATELET
Absolute Monocytes: 810 cells/uL (ref 200–950)
Basophils Absolute: 41 cells/uL (ref 0–200)
Basophils Relative: 0.5 %
Eosinophils Absolute: 138 cells/uL (ref 15–500)
Eosinophils Relative: 1.7 %
HCT: 40.5 % (ref 38.5–50.0)
Hemoglobin: 13.7 g/dL (ref 13.2–17.1)
Lymphs Abs: 1912 cells/uL (ref 850–3900)
MCH: 31.1 pg (ref 27.0–33.0)
MCHC: 33.8 g/dL (ref 32.0–36.0)
MCV: 92 fL (ref 80.0–100.0)
MPV: 11.2 fL (ref 7.5–12.5)
Monocytes Relative: 10 %
Neutro Abs: 5200 cells/uL (ref 1500–7800)
Neutrophils Relative %: 64.2 %
Platelets: 310 10*3/uL (ref 140–400)
RBC: 4.4 10*6/uL (ref 4.20–5.80)
RDW: 12.5 % (ref 11.0–15.0)
Total Lymphocyte: 23.6 %
WBC: 8.1 10*3/uL (ref 3.8–10.8)

## 2022-05-06 LAB — COMPLETE METABOLIC PANEL WITH GFR
AG Ratio: 2.6 (calc) — ABNORMAL HIGH (ref 1.0–2.5)
ALT: 34 U/L (ref 9–46)
AST: 22 U/L (ref 10–40)
Albumin: 5 g/dL (ref 3.6–5.1)
Alkaline phosphatase (APISO): 70 U/L (ref 36–130)
BUN: 13 mg/dL (ref 7–25)
CO2: 26 mmol/L (ref 20–32)
Calcium: 10.1 mg/dL (ref 8.6–10.3)
Chloride: 102 mmol/L (ref 98–110)
Creat: 0.91 mg/dL (ref 0.60–1.29)
Globulin: 1.9 g/dL (calc) (ref 1.9–3.7)
Glucose, Bld: 89 mg/dL (ref 65–99)
Potassium: 4.4 mmol/L (ref 3.5–5.3)
Sodium: 140 mmol/L (ref 135–146)
Total Bilirubin: 0.4 mg/dL (ref 0.2–1.2)
Total Protein: 6.9 g/dL (ref 6.1–8.1)
eGFR: 106 mL/min/{1.73_m2} (ref >=60)

## 2022-05-06 LAB — LIPID PANEL
Cholesterol: 110 mg/dL (ref <200)
HDL: 51 mg/dL (ref >=40)
LDL Cholesterol (Calc): 44 mg/dL (calc)
Non-HDL Cholesterol (Calc): 59 mg/dL (calc) (ref <130)
Total CHOL/HDL Ratio: 2.2 (calc) (ref <5.0)
Triglycerides: 71 mg/dL (ref <150)

## 2022-05-06 NOTE — Telephone Encounter (Signed)
CALLED PATIENT NO ANSWER LEFT VOICEMAIL FOR A CALL BACK ? ?

## 2022-05-06 NOTE — Assessment & Plan Note (Signed)
Compliant with meds, no SE, no myalgias, fatigue or jaundice Due for LP and recheck CMP Has been well controlled in the past with lipitor 20 mg daily

## 2022-05-06 NOTE — Assessment & Plan Note (Signed)
Currently controlled, using Rx creams and ointments, no recent flares

## 2022-05-06 NOTE — Assessment & Plan Note (Signed)
Note given for work training out of town, requires single room/bathroom

## 2022-05-06 NOTE — Assessment & Plan Note (Signed)
Stable sx, well controlled on zoloft

## 2022-05-06 NOTE — Assessment & Plan Note (Signed)
Sx fairly well controlled, meds refilled

## 2022-05-06 NOTE — Assessment & Plan Note (Signed)
Sx if off PPI, prior GI workup, they okayed long term ppi, pt aware of possible SE

## 2022-05-06 NOTE — Assessment & Plan Note (Signed)
No dysphagia/choking, refill PPI

## 2022-06-17 ENCOUNTER — Ambulatory Visit: Payer: BC Managed Care – PPO | Admitting: Family Medicine

## 2022-06-17 DIAGNOSIS — R45851 Suicidal ideations: Secondary | ICD-10-CM

## 2022-06-17 DIAGNOSIS — G4726 Circadian rhythm sleep disorder, shift work type: Secondary | ICD-10-CM | POA: Insufficient documentation

## 2022-06-17 DIAGNOSIS — M545 Low back pain, unspecified: Secondary | ICD-10-CM | POA: Insufficient documentation

## 2022-06-17 DIAGNOSIS — G5603 Carpal tunnel syndrome, bilateral upper limbs: Secondary | ICD-10-CM | POA: Insufficient documentation

## 2022-06-17 DIAGNOSIS — F331 Major depressive disorder, recurrent, moderate: Secondary | ICD-10-CM | POA: Insufficient documentation

## 2022-06-17 HISTORY — DX: Suicidal ideations: R45.851

## 2022-06-23 ENCOUNTER — Ambulatory Visit (INDEPENDENT_AMBULATORY_CARE_PROVIDER_SITE_OTHER): Payer: BC Managed Care – PPO | Admitting: Family Medicine

## 2022-06-23 ENCOUNTER — Encounter: Payer: Self-pay | Admitting: Family Medicine

## 2022-06-23 VITALS — BP 126/82 | HR 95 | Temp 97.6°F | Resp 16 | Ht 71.0 in | Wt 189.7 lb

## 2022-06-23 DIAGNOSIS — Z09 Encounter for follow-up examination after completed treatment for conditions other than malignant neoplasm: Secondary | ICD-10-CM | POA: Diagnosis not present

## 2022-06-23 DIAGNOSIS — G4726 Circadian rhythm sleep disorder, shift work type: Secondary | ICD-10-CM

## 2022-06-23 DIAGNOSIS — F331 Major depressive disorder, recurrent, moderate: Secondary | ICD-10-CM | POA: Diagnosis not present

## 2022-06-23 MED ORDER — ESZOPICLONE 3 MG PO TABS: 3.0000 mg | ORAL_TABLET | Freq: Every day | ORAL | 0 refills | Status: DC

## 2022-06-23 MED ORDER — VENLAFAXINE HCL ER 75 MG PO CP24: 75.0000 mg | ORAL_CAPSULE | Freq: Every day | ORAL | 1 refills | Status: DC

## 2022-06-23 NOTE — Progress Notes (Signed)
Patient ID: Joe Patterson, male    DOB: [birth year], 46 y.o.   MRN: [id number]  PCP: Delsa Grana, PA-C  Chief Complaint  Patient presents with   Insomnia    Pt was at the hospital due to unable sleeping he was dx with Suicidal ideation. Pt is requesting some extra medication of eszopiclone (LUNESTA) 3 mg tablet .    Subjective:   Joe Patterson is a 46 y.o. 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 - stopped zoloft     06/23/2022   10:48 AM 05/05/2022    8:27 AM 01/31/2022   10:26 AM  Depression screen PHQ 2/9  Decreased Interest 3 0 0  Down, Depressed, Hopeless 3 0 0  PHQ - 2 Score 6 0 0  Altered sleeping 3 1   Tired, decreased energy 3 1   Change in appetite 0 0   Feeling bad or failure about yourself  0 0   Trouble concentrating 0 0   Moving slowly or fidgety/restless 0 0   Suicidal thoughts 0 0   PHQ-9 Score 12 2   Difficult doing work/chores Somewhat difficult Somewhat difficult       06/23/2022   10:49 AM 05/05/2022    8:28 AM 02/26/2021    8:42 AM 05/08/2018   10:26 AM  GAD 7 : Generalized Anxiety Score  Nervous, Anxious, on Edge 3 0 0 0  Control/stop worrying 3 0 0 0  Worry too much - different things 3 0 0 0  Trouble relaxing 0 0 0 0  Restless 0 0 0 0  Easily annoyed or irritable 3 0 0 0  Afraid - awful might happen 3 0 0 0  Total GAD 7 Score 15 0 0 0  Anxiety Difficulty Somewhat difficult  Not difficult at all Not difficult at all    Therapy - [facility]  Psychiatry appt July 21 - [place] [facility]    Patient Active Problem List   Diagnosis Date Noted   Plantar fascia rupture 01/12/2022   Hypertriglyceridemia  11/26/2018   Elevated glucose 11/26/2018   Allergic rhinitis 11/14/2017   Hyperlipidemia 11/14/2017   TMJ (temporomandibular joint syndrome) 06/22/2016   Shoulder pain, bilateral 06/22/2016   Social anxiety disorder 12/29/2015   Allergy to perfume 12/29/2015   History of esophageal stricture 12/29/2015   Eczema, allergic 12/29/2015   Gastroesophageal reflux 12/29/2015      Current Outpatient Medications:    atorvastatin (LIPITOR) 20 MG tablet, TAKE 1 TABLET BY MOUTH EVERYDAY AT BEDTIME, Disp: 90 tablet, Rfl: 3   betamethasone dipropionate 0.05 % cream, Apply topically 2 (two) times daily. Use SPARINGLY; can cause stretch marks, Disp: 90 g, Rfl: 2   cetirizine (ZYRTEC) 10 MG tablet, Take 1 tablet (10 mg total) by mouth daily., Disp: 90 tablet, Rfl: 3   Eszopiclone 3 MG TABS, Take by mouth., Disp: , Rfl:    montelukast (SINGULAIR) 10 MG tablet, Take 1 tablet (10 mg total) by mouth at bedtime., Disp: 90 tablet, Rfl: 3   pantoprazole (PROTONIX) 40 MG tablet, Take 1  tablet (40 mg total) by mouth 2 (two) times daily., Disp: 180 tablet, Rfl: 3   sertraline (ZOLOFT) 50 MG tablet, Take 1 tablet (50 mg total) by mouth daily., Disp: 90 tablet, Rfl: 3   triamcinolone cream (KENALOG) 0.1 %, Apply 1 application topically 2 (two) times daily. If needed (weaker strength), Disp: 453.6 g, Rfl: 3   Turmeric 500 MG TABS, Take 500 mg by mouth daily., Disp: , Rfl:    valACYclovir (VALTREX) 500 MG tablet, Take 1 tablet (500 mg total) by mouth 2 (two) times daily as needed (take at sx onset for cold sore/canker sore x 5 d)., Disp: 20 tablet, Rfl: 5   venlafaxine XR (EFFEXOR-XR) 75 MG 24 hr capsule, Take by mouth., Disp: , Rfl:    No Known Allergies   Social History   Tobacco Use   Smoking status: Former    Packs/day: 1.00    Years: 15.00    Total pack years: 15.00    Types: Cigarettes    Quit date: 12/23/2007    Years since quitting: 14.5   Smokeless tobacco: Former    Types: Chew    Quit date:  2009  Vaping Use   Vaping Use: Never used  Substance Use Topics   Alcohol use: No   Drug use: No      Chart Review Today: I personally reviewed active problem list, medication list, allergies, family history, social history, health maintenance, notes from last encounter, lab results, imaging with the patient/caregiver today.   Review of Systems  Constitutional: Negative.   HENT: Negative.    Eyes: Negative.   Respiratory: Negative.    Cardiovascular: Negative.   Gastrointestinal: Negative.   Endocrine: Negative.   Genitourinary: Negative.   Musculoskeletal: Negative.   Skin: Negative.   Allergic/Immunologic: Negative.   Neurological: Negative.   Hematological: Negative.   Psychiatric/Behavioral: Negative.    All other systems reviewed and are negative.      Objective:   Vitals:   06/23/22 1050  BP: 126/82  Pulse: 95  Resp: 16  Temp: 97.6 F (36.4 C)  TempSrc: Oral  SpO2: 98%  Weight: 189 lb 11.2 oz (86 kg)  Height: '5\' 11"'  (1.803 m)    Body mass index is 26.46 kg/m.  Physical Exam Vitals and nursing note reviewed.  Constitutional:      Appearance: He is well-developed.  HENT:     Head: Normocephalic and atraumatic.     Nose: Nose normal.  Eyes:     General:        Right eye: No discharge.        Left eye: No discharge.     Conjunctiva/sclera: Conjunctivae normal.  Neck:     Trachea: No tracheal deviation.  Cardiovascular:     Rate and Rhythm: Normal rate and regular rhythm.  Pulmonary:     Effort: Pulmonary effort is normal. No respiratory distress.     Breath sounds: No stridor.  Musculoskeletal:        General: Normal range of motion.  Skin:    General: Skin is warm and dry.     Findings: No rash.  Neurological:     Mental Status: He is alert.     Motor: No abnormal muscle tone.     Coordination: Coordination normal.  Psychiatric:        Behavior: Behavior normal.      Results for orders placed or performed in visit on 05/05/22  CBC  with Differential/Platelet  Result Value Ref Range  WBC 8.1 3.8 - 10.8 Thousand/uL   RBC 4.40 4.20 - 5.80 Million/uL   Hemoglobin 13.7 13.2 - 17.1 g/dL   HCT 40.5 38.5 - 50.0 %   MCV 92.0 80.0 - 100.0 fL   MCH 31.1 27.0 - 33.0 pg   MCHC 33.8 32.0 - 36.0 g/dL   RDW 12.5 11.0 - 15.0 %   Platelets 310 140 - 400 Thousand/uL   MPV 11.2 7.5 - 12.5 fL   Neutro Abs 5,200 1,500 - 7,800 cells/uL   Lymphs Abs 1,912 850 - 3,900 cells/uL   Absolute Monocytes 810 200 - 950 cells/uL   Eosinophils Absolute 138 15 - 500 cells/uL   Basophils Absolute 41 0 - 200 cells/uL   Neutrophils Relative % 64.2 %   Total Lymphocyte 23.6 %   Monocytes Relative 10.0 %   Eosinophils Relative 1.7 %   Basophils Relative 0.5 %  COMPLETE METABOLIC PANEL WITH GFR  Result Value Ref Range   Glucose, Bld 89 65 - 99 mg/dL   BUN 13 7 - 25 mg/dL   Creat 0.91 0.60 - 1.29 mg/dL   eGFR 106 > OR = 60 mL/min/1.18m   BUN/Creatinine Ratio NOT APPLICABLE 6 - 22 (calc)   Sodium 140 135 - 146 mmol/L   Potassium 4.4 3.5 - 5.3 mmol/L   Chloride 102 98 - 110 mmol/L   CO2 26 20 - 32 mmol/L   Calcium 10.1 8.6 - 10.3 mg/dL   Total Protein 6.9 6.1 - 8.1 g/dL   Albumin 5.0 3.6 - 5.1 g/dL   Globulin 1.9 1.9 - 3.7 g/dL (calc)   AG Ratio 2.6 (H) 1.0 - 2.5 (calc)   Total Bilirubin 0.4 0.2 - 1.2 mg/dL   Alkaline phosphatase (APISO) 70 36 - 130 U/L   AST 22 10 - 40 U/L   ALT 34 9 - 46 U/L  Lipid panel  Result Value Ref Range   Cholesterol 110 <200 mg/dL   HDL 51 > OR = 40 mg/dL   Triglycerides 71 <150 mg/dL   LDL Cholesterol (Calc) 44 mg/dL (calc)   Total CHOL/HDL Ratio 2.2 <5.0 (calc)   Non-HDL Cholesterol (Calc) 59 <130 mg/dL (calc)       Assessment & Plan:     ICD-10-CM   1. Encounter for examination following treatment at hospital  Z09     2. 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 Moderate episode of recurrent major depressive disorder (HCC)   F33.1 venlafaxine XR (EFFEXOR-XR) 75 MG 24 hr capsule     Extensive chart review with psych admit through care everywhere Mood is much better, patient is less stressed Back on normal schedule w/o working nights he is sleeping better, through concerned about suddently running out of meds when just starting to feel much better He has psych f/up coming up Effexor is working well Controlled substance database reviewed  Explained that I will not Rx these meds long term, good for short term and any long term may need to be addressed with specialist His insomnia and psych admit were secondary to not sleeping due to night shift - and he no longer has that problem Encouraged him to est with psychiatry and remain established with them.     LDelsa Grana PA-C 06/23/22 10:55 AM

## 2022-06-29 ENCOUNTER — Ambulatory Visit
Admission: RE | Admit: 2022-06-29 | Discharge: 2022-06-29 | Disposition: A | Discharge status: Home / Self Care | Payer: BC Managed Care – PPO | Attending: Nurse Practitioner | Admitting: Nurse Practitioner

## 2022-06-29 ENCOUNTER — Ambulatory Visit (INDEPENDENT_AMBULATORY_CARE_PROVIDER_SITE_OTHER): Payer: BC Managed Care – PPO | Admitting: Nurse Practitioner

## 2022-06-29 ENCOUNTER — Other Ambulatory Visit: Payer: Self-pay

## 2022-06-29 ENCOUNTER — Ambulatory Visit
Admission: RE | Admit: 2022-06-29 | Discharge: 2022-06-29 | Disposition: A | Discharge status: Home / Self Care | Payer: BC Managed Care – PPO | Source: Ambulatory Visit | Attending: Nurse Practitioner | Admitting: Nurse Practitioner

## 2022-06-29 ENCOUNTER — Encounter: Payer: Self-pay | Admitting: Nurse Practitioner

## 2022-06-29 VITALS — BP 120/80 | HR 85 | Temp 98.1°F | Resp 18 | Ht 71.0 in | Wt 190.3 lb

## 2022-06-29 DIAGNOSIS — M25512 Pain in left shoulder: Secondary | ICD-10-CM | POA: Diagnosis present

## 2022-06-29 MED ORDER — PREDNISONE 10 MG (21) PO TBPK: ORAL_TABLET | ORAL | 0 refills | Status: DC

## 2022-06-29 MED ORDER — METHOCARBAMOL 500 MG PO TABS: 500.0000 mg | ORAL_TABLET | Freq: Two times a day (BID) | ORAL | 0 refills | Status: DC | PRN

## 2022-06-29 NOTE — Progress Notes (Signed)
BP 120/80   Pulse 85   Temp 98.1 F (36.7 C) (Oral)   Resp 18   Ht '5\' 11"'  (1.803 m)   Wt 190 lb 4.8 oz (86.3 kg)   SpO2 98%   BMI 26.54 kg/m    Subjective:    Patient ID: Joe Patterson, male    DOB: 06-30-1976, 46 y.o.   MRN: [id number]  HPI: AYANSH FEUTZ is a 46 y.o. male  Chief Complaint  Patient presents with   Shoulder Pain    For 5 days   Shoulder pain: Patient reports left shoulder pain. Patient states that he has had problems with his left shoulder for many years.  He says that on Saturday he was lifting heavy items and his left shoulder started hurting more.  He says he has taken aleve for the pain and it has helped. He is concerned that he might have a torn rotator cuff.  Patient reports he did have an xray 6 years ago and it did not show anything.  Patient has no decrease range in motion.  Discussed with patient that this is likely a muscle strain.  Will treat with muscle relaxer, steroids and continue to take aleve.  Discussed using heat therapy.  Will get xray and refer to orthopedics.   Relevant past medical, surgical, family and social history reviewed and updated as indicated. Interim medical history since our last visit reviewed. Allergies and medications reviewed and updated.  Review of Systems  Constitutional: Negative for fever or weight change.  Respiratory: Negative for cough and shortness of breath.   Cardiovascular: Negative for chest pain or palpitations.  Gastrointestinal: Negative for abdominal pain, no bowel changes.  Musculoskeletal: Negative for gait problem or joint swelling.  Positive for left shoulder pain Skin: Negative for rash.  Neurological: Negative for dizziness or headache.  No other specific complaints in a complete review of systems (except as listed in HPI above).      Objective:    BP 120/80   Pulse 85   Temp 98.1 F (36.7 C) (Oral)   Resp 18   Ht '5\' 11"'  (1.803 m)   Wt 190 lb 4.8 oz (86.3 kg)   SpO2 98%   BMI 26.54  kg/m   Wt Readings from Last 3 Encounters:  06/29/22 190 lb 4.8 oz (86.3 kg)  06/23/22 189 lb 11.2 oz (86 kg)  05/05/22 197 lb 1.6 oz (89.4 kg)    Physical Exam  Constitutional: Patient appears well-developed and well-nourished. Obese  No distress.  HEENT: head atraumatic, normocephalic, pupils equal and reactive to light,  neck supple Cardiovascular: Normal rate, regular rhythm and normal heart sounds.  No murmur heard. No BLE edema. Pulmonary/Chest: Effort normal and breath sounds normal. No respiratory distress. Abdominal: Soft.  There is no tenderness. MSK: Full range of motion.  Tenderness in the trapezius muscle. Psychiatric: Patient has a normal mood and affect. behavior is normal. Judgment and thought content normal.  Results for orders placed or performed in visit on 05/05/22  CBC with Differential/Platelet  Result Value Ref Range   WBC 8.1 3.8 - 10.8 Thousand/uL   RBC 4.40 4.20 - 5.80 Million/uL   Hemoglobin 13.7 13.2 - 17.1 g/dL   HCT 40.5 38.5 - 50.0 %   MCV 92.0 80.0 - 100.0 fL   MCH 31.1 27.0 - 33.0 pg   MCHC 33.8 32.0 - 36.0 g/dL   RDW 12.5 11.0 - 15.0 %   Platelets 310 140 - 400 Thousand/uL  MPV 11.2 7.5 - 12.5 fL   Neutro Abs 5,200 1,500 - 7,800 cells/uL   Lymphs Abs 1,912 850 - 3,900 cells/uL   Absolute Monocytes 810 200 - 950 cells/uL   Eosinophils Absolute 138 15 - 500 cells/uL   Basophils Absolute 41 0 - 200 cells/uL   Neutrophils Relative % 64.2 %   Total Lymphocyte 23.6 %   Monocytes Relative 10.0 %   Eosinophils Relative 1.7 %   Basophils Relative 0.5 %  COMPLETE METABOLIC PANEL WITH GFR  Result Value Ref Range   Glucose, Bld 89 65 - 99 mg/dL   BUN 13 7 - 25 mg/dL   Creat 0.91 0.60 - 1.29 mg/dL   eGFR 106 > OR = 60 mL/min/1.37m   BUN/Creatinine Ratio NOT APPLICABLE 6 - 22 (calc)   Sodium 140 135 - 146 mmol/L   Potassium 4.4 3.5 - 5.3 mmol/L   Chloride 102 98 - 110 mmol/L   CO2 26 20 - 32 mmol/L   Calcium 10.1 8.6 - 10.3 mg/dL   Total  Protein 6.9 6.1 - 8.1 g/dL   Albumin 5.0 3.6 - 5.1 g/dL   Globulin 1.9 1.9 - 3.7 g/dL (calc)   AG Ratio 2.6 (H) 1.0 - 2.5 (calc)   Total Bilirubin 0.4 0.2 - 1.2 mg/dL   Alkaline phosphatase (APISO) 70 36 - 130 U/L   AST 22 10 - 40 U/L   ALT 34 9 - 46 U/L  Lipid panel  Result Value Ref Range   Cholesterol 110 <200 mg/dL   HDL 51 > OR = 40 mg/dL   Triglycerides 71 <150 mg/dL   LDL Cholesterol (Calc) 44 mg/dL (calc)   Total CHOL/HDL Ratio 2.2 <5.0 (calc)   Non-HDL Cholesterol (Calc) 59 <130 mg/dL (calc)      Assessment & Plan:   Problem List Items Addressed This Visit   None Visit Diagnoses     Acute pain of left shoulder    -  Primary   Treat with steroid pack, Robaxin continue taking Aleve.  Discussed using heat therapy.  We will get x-ray and refer to orthopedics.   Relevant Medications   predniSONE (STERAPRED UNI-PAK 21 TAB) 10 MG (21) TBPK tablet   methocarbamol (ROBAXIN) 500 MG tablet   Other Relevant Orders   DG Shoulder Left   Ambulatory referral to Orthopedic Surgery        Follow up plan: Return if symptoms worsen or fail to improve.

## 2022-06-30 ENCOUNTER — Telehealth: Payer: Self-pay | Admitting: Family Medicine

## 2022-06-30 NOTE — Telephone Encounter (Signed)
Copied from CRM [id number]. Topic: General - Other >> Jun 30, 2022  8:36 AM Everette C wrote: Reason for CRM: The patient has called to request an order for an MRI to be sent to [facility]   Please contact the patient further if needed

## 2022-06-30 NOTE — Telephone Encounter (Signed)
Left message for patient with [facility] phone number to call and schedule appointment

## 2022-07-04 ENCOUNTER — Ambulatory Visit: Payer: BC Managed Care – PPO | Admitting: Nurse Practitioner

## 2022-07-13 ENCOUNTER — Encounter: Payer: Self-pay | Admitting: Podiatry

## 2022-07-13 ENCOUNTER — Ambulatory Visit: Payer: BC Managed Care – PPO | Admitting: Podiatry

## 2022-07-13 ENCOUNTER — Ambulatory Visit (INDEPENDENT_AMBULATORY_CARE_PROVIDER_SITE_OTHER): Payer: BC Managed Care – PPO

## 2022-07-13 DIAGNOSIS — M722 Plantar fascial fibromatosis: Secondary | ICD-10-CM

## 2022-07-13 MED ORDER — MELOXICAM 15 MG PO TABS: 15.0000 mg | ORAL_TABLET | Freq: Every day | ORAL | 3 refills | Status: DC

## 2022-07-13 MED ORDER — TRIAMCINOLONE ACETONIDE 40 MG/ML IJ SUSP
40.0000 mg | Freq: Once | INTRAMUSCULAR | Status: AC
  Administered 2022-07-13: 40 mg

## 2022-07-13 MED ORDER — METHYLPREDNISOLONE 4 MG PO TBPK: ORAL_TABLET | ORAL | 0 refills | Status: DC

## 2022-07-13 NOTE — Progress Notes (Signed)
Subjective:  Patient ID: Joe Patterson, male    DOB: [birth year],  MRN: [id number] HPI Chief Complaint  Patient presents with   Foot Pain    Plantar heels bilateral - aching x several months, AM pain   New Patient (Initial Visit)    46 y.o. male presents with the above complaint.   ROS: Denies fever chills nausea vomiting muscle aches  Calf pain back pain chest pain shortness of breath.  Past Medical History:  Diagnosis Date   Anxiety    Appendicitis 07/27/2019   Cyst of skin and subcutaneous tissue 06/22/2016   Eczema    Esophageal stricture    GERD (gastroesophageal reflux disease)    Medication monitoring encounter 11/20/2017   Morbid obesity (HCC) 11/21/2018   BMI >35 with GERD and hyperlipidemia   Non-celiac gluten sensitivity 12/29/2015   Preventative health care 07/05/2017   TMJ (dislocation of temporomandibular joint)    Weight gain 11/14/2017   Past Surgical History:  Procedure Laterality Date   APPENDECTOMY  2020   ESOPHAGEAL DILATION     2020   ESOPHAGOGASTRODUODENOSCOPY (EGD) WITH PROPOFOL N/A 02/08/2019   Procedure: ESOPHAGOGASTRODUODENOSCOPY (EGD) WITH PROPOFOL;  Surgeon: Wyline Mood, MD;  Location[facility] ENDOSCOPY;  Service: Gastroenterology;  Laterality: N/A;   HERNIA REPAIR     LAPAROSCOPIC APPENDECTOMY N/A 07/27/2019   Procedure: APPENDECTOMY LAPAROSCOPIC;  Surgeon: Leafy Ro, MD;  Location: [facility] ORS;  Service: General;  Laterality: N/A;   TYMPANOSTOMY TUBE PLACEMENT     as a child    Current Outpatient Medications:    meloxicam (MOBIC) 15 MG tablet, Take 1 tablet (15 mg total) by mouth daily., Disp: 30 tablet, Rfl: 3   methylPREDNISolone (MEDROL DOSEPAK) 4 MG TBPK tablet, 6 day dose pack - take as directed, Disp: 21 tablet, Rfl: 0   atorvastatin (LIPITOR) 20 MG tablet, TAKE 1 TABLET BY MOUTH EVERYDAY AT BEDTIME, Disp: 90 tablet, Rfl: 3   betamethasone dipropionate 0.05 % cream, Apply topically 2 (two) times daily. Use SPARINGLY; can cause stretch  marks, Disp: 90 g, Rfl: 2   cetirizine (ZYRTEC) 10 MG tablet, Take 1 tablet (10 mg total) by mouth daily., Disp: 90 tablet, Rfl: 3   Eszopiclone 3 MG TABS, Take by mouth., Disp: , Rfl:    Eszopiclone 3 MG TABS, Take 1 tablet (3 mg total) by mouth at bedtime. Take immediately before bedtime (Patient not taking: Reported on 06/29/2022), Disp: 30 tablet, Rfl: 0   methocarbamol (ROBAXIN) 500 MG tablet, Take 1 tablet (500 mg total) by mouth 2 (two) times daily as needed for muscle spasms., Disp: 20 tablet, Rfl: 0   montelukast (SINGULAIR) 10 MG tablet, Take 1 tablet (10 mg total) by mouth at bedtime., Disp: 90 tablet, Rfl: 3   pantoprazole (PROTONIX) 40 MG tablet, Take 1 tablet (40 mg total) by mouth 2 (two) times daily., Disp: 180 tablet, Rfl: 3   triamcinolone cream (KENALOG) 0.1 %, Apply 1 application topically 2 (two) times daily. If needed (weaker strength) (Patient not taking: Reported on 06/29/2022), Disp: 453.6 g, Rfl: 3   Turmeric 500 MG TABS, Take 500 mg by mouth daily., Disp: , Rfl:    valACYclovir (VALTREX) 500 MG tablet, Take 1 tablet (500 mg total) by mouth 2 (two) times daily as needed (take at sx onset for cold sore/canker sore x 5 d)., Disp: 20 tablet, Rfl: 5   [START ON 07/18/2022] venlafaxine XR (EFFEXOR-XR) 75 MG 24 hr capsule, Take 1 capsule (75 mg total) by mouth daily  with breakfast., Disp: 90 capsule, Rfl: 1  Allergies  Allergen Reactions   Other Nausea Only   Review of Systems Objective:  There were no vitals filed for this visit.  General: Well developed, nourished, in no acute distress, alert and oriented x3   Dermatological: Skin is warm, dry and supple bilateral. Nails x 10 are well maintained; remaining integument appears unremarkable at this time. There are no open sores, no preulcerative lesions, no rash or signs of infection present.  Vascular: Dorsalis Pedis artery and Posterior Tibial artery pedal pulses are 2/4 bilateral with immedate capillary fill time. Pedal hair  growth present. No varicosities and no lower extremity edema present bilateral.   Neruologic: Grossly intact via light touch bilateral. Vibratory intact via tuning fork bilateral. Protective threshold with Semmes Wienstein monofilament intact to all pedal sites bilateral. Patellar and Achilles deep tendon reflexes 2+ bilateral. No Babinski or clonus noted bilateral.   Musculoskeletal: No gross boney pedal deformities bilateral. No pain, crepitus, or limitation noted with foot and ankle range of motion bilateral. Muscular strength 5/5 in all groups tested bilateral.  Pain on palpation medial calcaneal tubercles bilateral.  Gait: Unassisted, Nonantalgic.    Radiographs:  Radiographs taken today demonstrate soft tissue increase in density at the plantar fascia calcaneal insertion site.  Otherwise no significant acute findings.  Assessment & Plan:   Assessment: Planter fasciitis bilateral.  Plan: Injected the bilateral heels today 20 mg Kenalog 5 mg Marcaine started him on methylprednisolone to be followed by meloxicam.  And he was casted for orthotics.     Jakob Kimberlin T. Stuckey, [place]

## 2022-07-19 ENCOUNTER — Ambulatory Visit: Payer: BC Managed Care – PPO | Admitting: Family Medicine

## 2022-07-19 ENCOUNTER — Encounter: Payer: Self-pay | Admitting: Family Medicine

## 2022-07-19 VITALS — BP 132/80 | HR 93 | Temp 98.4°F | Resp 16 | Ht 71.0 in | Wt 191.6 lb

## 2022-07-19 DIAGNOSIS — M542 Cervicalgia: Secondary | ICD-10-CM

## 2022-07-19 DIAGNOSIS — M5412 Radiculopathy, cervical region: Secondary | ICD-10-CM

## 2022-07-19 MED ORDER — PREGABALIN 25 MG PO CAPS: 25.0000 mg | ORAL_CAPSULE | Freq: Two times a day (BID) | ORAL | 1 refills | Status: DC

## 2022-07-19 MED ORDER — ESZOPICLONE 2 MG PO TABS
2.0000 mg | ORAL_TABLET | Freq: Every evening | ORAL | 0 refills | Status: DC | PRN
Start: 2022-08-04 — End: 2022-08-04

## 2022-07-19 NOTE — Progress Notes (Signed)
Patient ID: Joe Patterson, male    DOB: November 09, 1976, 46 y.o.   MRN: [id number]  PCP: Delsa Grana, PA-C  Chief Complaint  Patient presents with   Pain    Pt states ongoing for a while. Left side from neck-shoulder-hand. Pt believes it is nerve pain due to his mo having it as well. Pt would like Lyrica prescribed.    Subjective:   Joe Patterson is a 46 y.o. male, presents to clinic with CC of the following:  HPI   Presents for nerve pain to hands - numbness/tingling L>R, neck pain back and shoulder pain for years - left shoulder eval by Lisette Grinder - MRI of left shoulder neg a few weeks ago - still doing left shoulder PT He reports neck pain off and on for years, he feels this is probably the cause of his shoulder pain and arm pain and hand tingling and numbness.  Different positions when driving irritate his neck and hand and cause numbness He wants to try lyrica cause his mom has same sx and that med works for her No past neck xrays or MRI       Patient Active Problem List   Diagnosis Date Noted   Bilateral carpal tunnel syndrome 06/17/2022   Circadian rhythm sleep disorder, shift work type 06/17/2022   Feeling like committing suicide 06/17/2022   Low back pain 06/17/2022   Moderate episode of recurrent major depressive disorder ([place]) 06/17/2022   Plantar fascia rupture 01/12/2022   Hypertriglyceridemia 11/26/2018   Elevated glucose 11/26/2018   Allergic rhinitis 11/14/2017   Hyperlipidemia 11/14/2017   TMJ (temporomandibular joint syndrome) 06/22/2016   Shoulder pain, bilateral 06/22/2016   Social anxiety disorder 12/29/2015   Allergy to perfume 12/29/2015   History of esophageal stricture 12/29/2015   Eczema, allergic 12/29/2015   Gastroesophageal reflux 12/29/2015      Current Outpatient Medications:    atorvastatin (LIPITOR) 20 MG tablet, TAKE 1 TABLET BY MOUTH EVERYDAY AT BEDTIME, Disp: 90 tablet, Rfl: 3   betamethasone dipropionate 0.05 % cream, Apply  topically 2 (two) times daily. Use SPARINGLY; can cause stretch marks, Disp: 90 g, Rfl: 2   cetirizine (ZYRTEC) 10 MG tablet, Take 1 tablet (10 mg total) by mouth daily., Disp: 90 tablet, Rfl: 3   Eszopiclone 3 MG TABS, Take by mouth., Disp: , Rfl:    Eszopiclone 3 MG TABS, Take 1 tablet (3 mg total) by mouth at bedtime. Take immediately before bedtime (Patient not taking: Reported on 06/29/2022), Disp: 30 tablet, Rfl: 0   meloxicam (MOBIC) 15 MG tablet, Take 1 tablet (15 mg total) by mouth daily. (Patient not taking: Reported on 07/19/2022), Disp: 30 tablet, Rfl: 3   methocarbamol (ROBAXIN) 500 MG tablet, Take 1 tablet (500 mg total) by mouth 2 (two) times daily as needed for muscle spasms. (Patient not taking: Reported on 07/19/2022), Disp: 20 tablet, Rfl: 0   methylPREDNISolone (MEDROL DOSEPAK) 4 MG TBPK tablet, 6 day dose pack - take as directed, Disp: 21 tablet, Rfl: 0   montelukast (SINGULAIR) 10 MG tablet, Take 1 tablet (10 mg total) by mouth at bedtime., Disp: 90 tablet, Rfl: 3   pantoprazole (PROTONIX) 40 MG tablet, Take 1 tablet (40 mg total) by mouth 2 (two) times daily., Disp: 180 tablet, Rfl: 3   triamcinolone cream (KENALOG) 0.1 %, Apply 1 application topically 2 (two) times daily. If needed (weaker strength) (Patient not taking: Reported on 06/29/2022), Disp: 453.6 g, Rfl: 3   Turmeric 500  MG TABS, Take 500 mg by mouth daily., Disp: , Rfl:    valACYclovir (VALTREX) 500 MG tablet, Take 1 tablet (500 mg total) by mouth 2 (two) times daily as needed (take at sx onset for cold sore/canker sore x 5 d)., Disp: 20 tablet, Rfl: 5   venlafaxine XR (EFFEXOR-XR) 75 MG 24 hr capsule, Take 1 capsule (75 mg total) by mouth daily with breakfast., Disp: 90 capsule, Rfl: 1   Allergies  Allergen Reactions   Other Nausea Only     Social History   Tobacco Use   Smoking status: Former    Packs/day: 1.00    Years: 15.00    Total pack years: 15.00    Types: Cigarettes    Quit date: 12/23/2007    Years  since quitting: 14.5   Smokeless tobacco: Former    Types: Chew    Quit date: 2009  Vaping Use   Vaping Use: Never used  Substance Use Topics   Alcohol use: No   Drug use: No      Chart Review Today: I have reviewed the patient's medical history in detail and updated the computerized patient record.   Review of Systems  Constitutional: Negative.   HENT: Negative.    Eyes: Negative.   Respiratory: Negative.    Cardiovascular: Negative.   Gastrointestinal: Negative.   Endocrine: Negative.   Genitourinary: Negative.   Musculoskeletal: Negative.   Skin: Negative.   Allergic/Immunologic: Negative.   Neurological: Negative.   Hematological: Negative.   Psychiatric/Behavioral: Negative.    All other systems reviewed and are negative.      Objective:   Vitals:   07/19/22 1513  BP: 132/80  Pulse: 93  Resp: 16  Temp: 98.4 F (36.9 C)  TempSrc: Oral  SpO2: 97%  Weight: 191 lb 9.6 oz (86.9 kg)  Height: _0  (1.803 m)    Body mass index is 26.72 kg/m.  Physical Exam Vitals reviewed.  Cardiovascular:     Rate and Rhythm: Normal rate and regular rhythm.     Pulses: Normal pulses.     Heart sounds: Normal heart sounds.  Pulmonary:     Effort: Pulmonary effort is normal.     Breath sounds: Normal breath sounds.  Musculoskeletal:     Right hand: No swelling. Normal range of motion. Normal strength. Normal capillary refill. Normal pulse.     Left hand: No swelling. Normal range of motion. Normal strength. Normal capillary refill. Normal pulse.  Skin:    Capillary Refill: Capillary refill takes less than 2 seconds.  Neurological:     Mental Status: He is alert. Mental status is at baseline.  Psychiatric:        Mood and Affect: Mood normal.     Results for orders placed or performed in visit on 05/05/22  CBC with Differential/Platelet  Result Value Ref Range   WBC 8.1 3.8 - 10.8 Thousand/uL   RBC 4.40 4.20 - 5.80 Million/uL   Hemoglobin 13.7 13.2 - 17.1 g/dL    HCT 40.5 38.5 - 50.0 %   MCV 92.0 80.0 - 100.0 fL   MCH 31.1 27.0 - 33.0 pg   MCHC 33.8 32.0 - 36.0 g/dL   RDW 12.5 11.0 - 15.0 %   Platelets 310 140 - 400 Thousand/uL   MPV 11.2 7.5 - 12.5 fL   Neutro Abs 5,200 1,500 - 7,800 cells/uL   Lymphs Abs 1,912 850 - 3,900 cells/uL   Absolute Monocytes 810 200 - 950 cells/uL  Eosinophils Absolute 138 15 - 500 cells/uL   Basophils Absolute 41 0 - 200 cells/uL   Neutrophils Relative % 64.2 %   Total Lymphocyte 23.6 %   Monocytes Relative 10.0 %   Eosinophils Relative 1.7 %   Basophils Relative 0.5 %  COMPLETE METABOLIC PANEL WITH GFR  Result Value Ref Range   Glucose, Bld 89 65 - 99 mg/dL   BUN 13 7 - 25 mg/dL   Creat 0.91 0.60 - 1.29 mg/dL   eGFR 106 > OR = 60 mL/min/1.35m   BUN/Creatinine Ratio NOT APPLICABLE 6 - 22 (calc)   Sodium 140 135 - 146 mmol/L   Potassium 4.4 3.5 - 5.3 mmol/L   Chloride 102 98 - 110 mmol/L   CO2 26 20 - 32 mmol/L   Calcium 10.1 8.6 - 10.3 mg/dL   Total Protein 6.9 6.1 - 8.1 g/dL   Albumin 5.0 3.6 - 5.1 g/dL   Globulin 1.9 1.9 - 3.7 g/dL (calc)   AG Ratio 2.6 (H) 1.0 - 2.5 (calc)   Total Bilirubin 0.4 0.2 - 1.2 mg/dL   Alkaline phosphatase (APISO) 70 36 - 130 U/L   AST 22 10 - 40 U/L   ALT 34 9 - 46 U/L  Lipid panel  Result Value Ref Range   Cholesterol 110 <200 mg/dL   HDL 51 > OR = 40 mg/dL   Triglycerides 71 <150 mg/dL   LDL Cholesterol (Calc) 44 mg/dL (calc)   Total CHOL/HDL Ratio 2.2 <5.0 (calc)   Non-HDL Cholesterol (Calc) 59 <130 mg/dL (calc)       Assessment & Plan:     ICD-10-CM   1. Neck pain  M54.2 DG Cervical Spine Complete    Ambulatory referral to Orthopedics    pregabalin (LYRICA) 25 MG capsule   for many years, with radiation of pain through shoulder and numbness and tingling to both hands, L>R    2. Cervical radiculopathy  M54.12 pregabalin (LYRICA) 25 MG capsule      Good ROM, refer to ortho for spine eval - xray, trial of lyrica requested Pt had extensive eval of  shoulder w MRI and PT with no improvement and no pathology found, likely needs cervical spine eval and PT       LDelsa Grana PA-C 07/19/22 3:23 PM

## 2022-07-28 ENCOUNTER — Encounter: Payer: Self-pay | Admitting: Family Medicine

## 2022-07-28 ENCOUNTER — Ambulatory Visit: Payer: BC Managed Care – PPO | Admitting: Family Medicine

## 2022-07-28 VITALS — BP 116/70 | HR 85 | Temp 98.1°F | Resp 16 | Ht 71.0 in | Wt 190.3 lb

## 2022-07-28 DIAGNOSIS — Z8719 Personal history of other diseases of the digestive system: Secondary | ICD-10-CM

## 2022-07-28 DIAGNOSIS — K4090 Unilateral inguinal hernia, without obstruction or gangrene, not specified as recurrent: Secondary | ICD-10-CM

## 2022-07-28 DIAGNOSIS — Z9889 Other specified postprocedural states: Secondary | ICD-10-CM

## 2022-07-28 DIAGNOSIS — M5412 Radiculopathy, cervical region: Secondary | ICD-10-CM | POA: Insufficient documentation

## 2022-07-28 NOTE — Patient Instructions (Signed)
Inguinal Hernia, Adult An inguinal hernia is when fat or your intestines push through a weak spot in a muscle where your leg meets your lower belly (groin). This causes a bulge. This kind of hernia could also be: In your scrotum, if you are male. In folds of skin around your vagina, if you are male. There are three types of inguinal hernias: Hernias that can be pushed back into the belly (are reducible). This type rarely causes pain. Hernias that cannot be pushed back into the belly (are incarcerated). Hernias that cannot be pushed back into the belly and lose their blood supply (are strangulated). This type needs emergency surgery. What are the causes? This condition is caused by having a weak spot in the muscles or tissues in your groin. This develops over time. The hernia may poke through the weak spot when you strain your lower belly muscles all of a sudden, such as when you: Lift a heavy object. Strain to poop (have a bowel movement). Trouble pooping (constipation) can lead to straining. Cough. What increases the risk? This condition is more likely to develop in: Males. Pregnant females. People who: Are overweight. Work in jobs that require long periods of standing or heavy lifting. Have had an inguinal hernia before. Smoke or have lung disease. These factors can lead to long-term (chronic) coughing. What are the signs or symptoms? Symptoms may depend on the size of the hernia. Often, a small hernia has no symptoms. Symptoms of a larger hernia may include: A bulge in the groin area. This is easier to see when standing. You might not be able to see it when you are lying down. Pain or burning in the groin. This may get worse when you lift, strain, or cough. A dull ache or a feeling of pressure in the groin. An abnormal bulge in the scrotum, in males. Symptoms of a strangulated inguinal hernia may include: A bulge in your groin that is very painful and tender to the touch. A bulge  that turns red or purple. Fever, feeling like you may vomit (nausea), and vomiting. Not being able to poop or to pass gas. How is this treated? Treatment depends on the size of your hernia and whether you have symptoms. If you do not have symptoms, your doctor may have you watch your hernia carefully and have you come in for follow-up visits. If your hernia is large or if you have symptoms, you may need surgery to repair the hernia. Follow these instructions at home: Lifestyle Avoid lifting heavy objects. Avoid standing for long amounts of time. Do not smoke or use any products that contain nicotine or tobacco. If you need help quitting, ask your doctor. Stay at a healthy weight. Prevent trouble pooping You may need to take these actions to prevent or treat trouble pooping: Drink enough fluid to keep your pee (urine) pale yellow. Take over-the-counter or prescription medicines. Eat foods that are high in fiber. These include beans, whole grains, and fresh fruits and vegetables. Limit foods that are high in fat and sugar. These include fried or sweet foods. General instructions You may try to push your hernia back in place by very gently pressing on it when you are lying down. Do not try to push the bulge back in if it will not go in easily. Watch your hernia for any changes in shape, size, or color. Tell your doctor if you see any changes. Take over-the-counter and prescription medicines only as told by your doctor. Keep   all follow-up visits. Contact a doctor if: You have a fever or chills. You have new symptoms. Your symptoms get worse. Get help right away if: You have pain in your groin that gets worse all of a sudden. You have a bulge in your groin that: Gets bigger all of a sudden, and it does not get smaller after that. Turns red or purple. Is painful when you touch it. You are a male, and you have: Sudden pain in your scrotum. A sudden change in the size of your scrotum. You  cannot push the hernia back in place by very gently pressing on it when you are lying down. You feel like you may vomit, and that feeling does not go away. You keep vomiting. You have a fast heartbeat. You cannot poop or pass gas. These symptoms may be an emergency. Get help right away. Call your local emergency services (911 in the [place] Do not wait to see if the symptoms will go away. Do not drive yourself to the hospital. Summary An inguinal hernia is when fat or your intestines push through a weak spot in a muscle where your leg meets your lower belly (groin). This causes a bulge. If you do not have symptoms, you may not need treatment. If you have symptoms or a large hernia, you may need surgery. Avoid lifting heavy objects. Also, avoid standing for long amounts of time. Do not try to push the bulge back in if it will not go in easily. This information is not intended to replace advice given to you by your health care provider. Make sure you discuss any questions you have with your health care provider. Document Revised: 07/28/2020 Document Reviewed: 07/28/2020 Elsevier Patient Education  2023 [employer]  

## 2022-07-28 NOTE — Progress Notes (Signed)
Patient ID: Joe Patterson, male    DOB: 28-Sep-1976, 46 y.o.   MRN: [id number]  PCP: Delsa Grana, PA-C  Chief Complaint  Patient presents with   Hernia    Possible hernia on right side of the groin area. Pt states noticed it a few days ago, seem like a lump and it hurts    Subjective:   Joe Patterson is a 46 y.o. male, presents to clinic with CC of the following:  HPI  Hernia concerns Had hernia repair 13 years ago [facility] No strain or injury Abd pain began last couple days and then last night he noted swelling to right groin with a bulge, Not reducible with pressure     Patient Active Problem List   Diagnosis Date Noted   Cervical radiculopathy 07/28/2022   Bilateral carpal tunnel syndrome 06/17/2022   Circadian rhythm sleep disorder, shift work type 06/17/2022   Low back pain 06/17/2022   Moderate episode of recurrent major depressive disorder ([place]) 06/17/2022   Plantar fascia rupture 01/12/2022   Hypertriglyceridemia 11/26/2018   Elevated glucose 11/26/2018   Allergic rhinitis 11/14/2017   Hyperlipidemia 11/14/2017   TMJ (temporomandibular joint syndrome) 06/22/2016   Shoulder pain, bilateral 06/22/2016   Social anxiety disorder 12/29/2015   Allergy to perfume 12/29/2015   History of esophageal stricture 12/29/2015   Eczema, allergic 12/29/2015   Gastroesophageal reflux 12/29/2015      Current Outpatient Medications:    atorvastatin (LIPITOR) 20 MG tablet, TAKE 1 TABLET BY MOUTH EVERYDAY AT BEDTIME, Disp: 90 tablet, Rfl: 3   betamethasone dipropionate 0.05 % cream, Apply topically 2 (two) times daily. Use SPARINGLY; can cause stretch marks, Disp: 90 g, Rfl: 2   cetirizine (ZYRTEC) 10 MG tablet, Take 1 tablet (10 mg total) by mouth daily., Disp: 90 tablet, Rfl: 3   [START ON 08/04/2022] eszopiclone (LUNESTA) 2 MG TABS tablet, Take 1 tablet (2 mg total) by mouth at bedtime as needed for sleep. Take immediately before bedtime, Disp: 30  tablet, Rfl: 0   meloxicam (MOBIC) 15 MG tablet, Take 1 tablet (15 mg total) by mouth daily., Disp: 30 tablet, Rfl: 3   montelukast (SINGULAIR) 10 MG tablet, Take 1 tablet (10 mg total) by mouth at bedtime., Disp: 90 tablet, Rfl: 3   pantoprazole (PROTONIX) 40 MG tablet, Take 1 tablet (40 mg total) by mouth 2 (two) times daily., Disp: 180 tablet, Rfl: 3   pregabalin (LYRICA) 25 MG capsule, Take 1 capsule (25 mg total) by mouth 2 (two) times daily., Disp: 180 capsule, Rfl: 1   Turmeric 500 MG TABS, Take 500 mg by mouth daily., Disp: , Rfl:    valACYclovir (VALTREX) 500 MG tablet, Take 1 tablet (500 mg total) by mouth 2 (two) times daily as needed (take at sx onset for cold sore/canker sore x 5 d)., Disp: 20 tablet, Rfl: 5   venlafaxine XR (EFFEXOR-XR) 75 MG 24 hr capsule, Take 1 capsule (75 mg total) by mouth daily with breakfast., Disp: 90 capsule, Rfl: 1   Allergies  Allergen Reactions   Other Nausea Only     Social History   Tobacco Use   Smoking status: Former    Packs/day: 1.00    Years: 15.00    Total pack years: 15.00    Types: Cigarettes    Quit date: 12/23/2007    Years since quitting: 14.6   Smokeless tobacco: Former    Types: Chew    Quit date: 2009  Vaping  Use   Vaping Use: Never used  Substance Use Topics   Alcohol use: No   Drug use: No      Chart Review Today: I personally reviewed active problem list, medication list, allergies, family history, social history, health maintenance, notes from last encounter, lab results, imaging with the patient/caregiver today.   Review of Systems  Constitutional: Negative.   HENT: Negative.    Eyes: Negative.   Respiratory: Negative.    Cardiovascular: Negative.   Gastrointestinal: Negative.   Endocrine: Negative.   Genitourinary: Negative.   Musculoskeletal: Negative.   Skin: Negative.   Allergic/Immunologic: Negative.   Neurological: Negative.   Hematological: Negative.   Psychiatric/Behavioral: Negative.    All  other systems reviewed and are negative.      Objective:   Vitals:   07/28/22 1401  BP: 116/70  Pulse: 85  Resp: 16  Temp: 98.1 F (36.7 C)  TempSrc: Oral  SpO2: 98%  Weight: 190 lb 4.8 oz (86.3 kg)  Height: '5\' 11"'  (1.803 m)    Body mass index is 26.54 kg/m.  Physical Exam Vitals and nursing note reviewed.  Constitutional:      Appearance: He is well-developed.  HENT:     Head: Normocephalic and atraumatic.     Nose: Nose normal.  Eyes:     General:        Right eye: No discharge.        Left eye: No discharge.     Conjunctiva/sclera: Conjunctivae normal.  Neck:     Trachea: No tracheal deviation.  Cardiovascular:     Rate and Rhythm: Normal rate and regular rhythm.  Pulmonary:     Effort: Pulmonary effort is normal. No respiratory distress.     Breath sounds: No stridor.  Abdominal:     General: Abdomen is flat. Bowel sounds are normal. There is no distension.     Palpations: Abdomen is soft.     Tenderness: There is no abdominal tenderness.     Hernia: A hernia is present. Hernia is present in the right inguinal area (firm, tender, not reducible). There is no hernia in the left inguinal area.  Musculoskeletal:        General: Normal range of motion.  Skin:    General: Skin is warm and dry.     Findings: No rash.  Neurological:     Mental Status: He is alert.     Motor: No abnormal muscle tone.     Coordination: Coordination normal.  Psychiatric:        Behavior: Behavior normal.      Results for orders placed or performed in visit on 05/05/22  CBC with Differential/Platelet  Result Value Ref Range   WBC 8.1 3.8 - 10.8 Thousand/uL   RBC 4.40 4.20 - 5.80 Million/uL   Hemoglobin 13.7 13.2 - 17.1 g/dL   HCT 40.5 38.5 - 50.0 %   MCV 92.0 80.0 - 100.0 fL   MCH 31.1 27.0 - 33.0 pg   MCHC 33.8 32.0 - 36.0 g/dL   RDW 12.5 11.0 - 15.0 %   Platelets 310 140 - 400 Thousand/uL   MPV 11.2 7.5 - 12.5 fL   Neutro Abs 5,200 1,500 - 7,800 cells/uL   Lymphs Abs  1,912 850 - 3,900 cells/uL   Absolute Monocytes 810 200 - 950 cells/uL   Eosinophils Absolute 138 15 - 500 cells/uL   Basophils Absolute 41 0 - 200 cells/uL   Neutrophils Relative % 64.2 %   Total  Lymphocyte 23.6 %   Monocytes Relative 10.0 %   Eosinophils Relative 1.7 %   Basophils Relative 0.5 %  COMPLETE METABOLIC PANEL WITH GFR  Result Value Ref Range   Glucose, Bld 89 65 - 99 mg/dL   BUN 13 7 - 25 mg/dL   Creat 0.91 0.60 - 1.29 mg/dL   eGFR 106 > OR = 60 mL/min/1.47m   BUN/Creatinine Ratio NOT APPLICABLE 6 - 22 (calc)   Sodium 140 135 - 146 mmol/L   Potassium 4.4 3.5 - 5.3 mmol/L   Chloride 102 98 - 110 mmol/L   CO2 26 20 - 32 mmol/L   Calcium 10.1 8.6 - 10.3 mg/dL   Total Protein 6.9 6.1 - 8.1 g/dL   Albumin 5.0 3.6 - 5.1 g/dL   Globulin 1.9 1.9 - 3.7 g/dL (calc)   AG Ratio 2.6 (H) 1.0 - 2.5 (calc)   Total Bilirubin 0.4 0.2 - 1.2 mg/dL   Alkaline phosphatase (APISO) 70 36 - 130 U/L   AST 22 10 - 40 U/L   ALT 34 9 - 46 U/L  Lipid panel  Result Value Ref Range   Cholesterol 110 <200 mg/dL   HDL 51 > OR = 40 mg/dL   Triglycerides 71 <150 mg/dL   LDL Cholesterol (Calc) 44 mg/dL (calc)   Total CHOL/HDL Ratio 2.2 <5.0 (calc)   Non-HDL Cholesterol (Calc) 59 <130 mg/dL (calc)       Assessment & Plan:     ICD-10-CM   1. Right inguinal hernia  K40.90 Ambulatory referral to General Surgery   right groin, not reducible, tender, passing gas and having BM, no N/V, prior hernia repair 13+ years ago, needs local surgical consult    2. History of right inguinal hernia repair  Z98.890 Ambulatory referral to General Surgery   Z87.19       Pt well appearing, VSS, tolerating PO's, passing gas/stool normally Hernia was not reproducible in office, nor at home the pt states, when he first had hernia it slid in and out, was easily reducible and was not tender.  Explained trendelenburg, ice, gentle pressure  etc to possibly try to reduce at home.  Will place urgent referral for eval.   ER precautions reviewed.     LDelsa Grana PA-C 07/28/22 5:24 PM

## 2022-08-04 ENCOUNTER — Ambulatory Visit: Payer: BC Managed Care – PPO | Admitting: Surgery

## 2022-08-04 ENCOUNTER — Other Ambulatory Visit: Payer: Self-pay

## 2022-08-04 ENCOUNTER — Encounter: Payer: Self-pay | Admitting: Surgery

## 2022-08-04 ENCOUNTER — Telehealth: Payer: Self-pay | Admitting: Podiatry

## 2022-08-04 VITALS — BP 128/85 | HR 66 | Temp 98.7°F | Ht 71.0 in | Wt 194.6 lb

## 2022-08-04 DIAGNOSIS — R1031 Right lower quadrant pain: Secondary | ICD-10-CM

## 2022-08-04 DIAGNOSIS — R1909 Other intra-abdominal and pelvic swelling, mass and lump: Secondary | ICD-10-CM | POA: Diagnosis not present

## 2022-08-04 NOTE — Telephone Encounter (Signed)
Lvm for patient to call office to sched appointment for [facility]  

## 2022-08-04 NOTE — Patient Instructions (Addendum)
Ultrasound scheduled 08/11/22 @ 3 pm @ [facility].    Dr.Rodenberg will call you with the Ultrasound results.   Inguinal Hernia, Adult An inguinal hernia develops when fat or the intestines push through a weak spot in a muscle where the leg meets the lower abdomen (groin). This creates a bulge. This kind of hernia could also be: In the scrotum, if you are male. In folds of skin around the vagina, if you are male. There are three types of inguinal hernias: Hernias that can be pushed back into the abdomen (are reducible). This type rarely causes pain. Hernias that are not reducible (are incarcerated). Hernias that are not reducible and lose their blood supply (are strangulated). This type of hernia requires emergency surgery. What are the causes? This condition is caused by having a weak spot in the muscles or tissues in your groin. This develops over time. The hernia may poke through the weak spot when you suddenly strain your lower abdominal muscles, such as when you: Lift a heavy object. Strain to have a bowel movement. Constipation can lead to straining. Cough. What increases the risk? This condition is more likely to develop in: Males. Pregnant females. People who: Are overweight. Work in jobs that require long periods of standing or heavy lifting. Have had an inguinal hernia before. Smoke or have lung disease. These factors can lead to long-term (chronic) coughing. What are the signs or symptoms? Symptoms may depend on the size of the hernia. Often, a small inguinal hernia has no symptoms. Symptoms of a larger hernia may include: A bulge in the groin area. This is easier to see when standing. It might not be visible when lying down. Pain or burning in the groin. This may get worse when lifting, straining, or coughing. A dull ache or a feeling of pressure in the groin. An unusual bulge in the scrotum, in males. Symptoms of a strangulated inguinal hernia may include: A bulge in your  groin that is very painful and tender to the touch. A bulge that turns red or purple. Fever, nausea, and vomiting. Inability to have a bowel movement or to pass gas. How is this diagnosed? This condition is diagnosed based on your symptoms, your medical history, and a physical exam. Your health care provider may feel your groin area and ask you to cough. How is this treated? Treatment depends on the size of your hernia and whether you have symptoms. If you do not have symptoms, your health care provider may have you watch your hernia carefully and have you come in for follow-up visits. If your hernia is large or if you have symptoms, you may need surgery to repair the hernia. Follow these instructions at home: Lifestyle Avoid lifting heavy objects. Avoid standing for long periods of time. Do not use any products that contain nicotine or tobacco. These products include cigarettes, chewing tobacco, and vaping devices, such as e-cigarettes. If you need help quitting, ask your health care provider. Maintain a healthy weight. Preventing constipation You may need to take these actions to prevent or treat constipation: Drink enough fluid to keep your urine pale yellow. Take over-the-counter or prescription medicines. Eat foods that are high in fiber, such as beans, whole grains, and fresh fruits and vegetables. Limit foods that are high in fat and processed sugars, such as fried or sweet foods. General instructions You may try to push the hernia back in place by very gently pressing on it while lying down. Do not try to force  the bulge back in if it will not push in easily. Watch your hernia for any changes in shape, size, or color. Get help right away if you notice any changes. Take over-the-counter and prescription medicines only as told by your health care provider. Keep all follow-up visits. This is important. Contact a health care provider if: You have a fever or chills. You develop new  symptoms. Your symptoms get worse. Get help right away if: You have pain in your groin that suddenly gets worse. You have a bulge in your groin that: Suddenly gets bigger and does not get smaller. Becomes red or purple or painful to the touch. You are a man and you have a sudden pain in your scrotum, or the size of your scrotum suddenly changes. You cannot push the hernia back in place by very gently pressing on it when you are lying down. You have nausea or vomiting that does not go away. You have a fast heartbeat. You cannot have a bowel movement or pass gas. These symptoms may represent a serious problem that is an emergency. Do not wait to see if the symptoms will go away. Get medical help right away. Call your local emergency services (911 in the [place] Summary An inguinal hernia develops when fat or the intestines push through a weak spot in a muscle where your leg meets your lower abdomen (groin). This condition is caused by having a weak spot in muscles or tissues in your groin. Symptoms may depend on the size of the hernia, and they may include pain or swelling in your groin. A small inguinal hernia often has no symptoms. Treatment may not be needed if you do not have symptoms. If you have symptoms or a large hernia, you may need surgery to repair the hernia. Avoid lifting heavy objects. Also, avoid standing for long periods of time. This information is not intended to replace advice given to you by your health care provider. Make sure you discuss any questions you have with your health care provider. Document Revised: 07/28/2020 Document Reviewed: 07/28/2020 Elsevier Patient Education  2023 [employer].

## 2022-08-04 NOTE — Progress Notes (Signed)
Patient ID: Joe Patterson, male   DOB: January 05, 1976, 46 y.o.   MRN: [id number]  Chief Complaint: Right groin mass.  History of Present Illness Joe Patterson is a 46 y.o. male with prior history of right inguinal hernia, repaired with mesh 13 years ago.  Recent episode of severe vomiting with gastroenteritis, with new onset of right groin bulge that was tender at the time.  Has not noted any appreciable tenderness since, has not provoked or exacerbated any additional groin pain.  The bulge in the area remains and does not go away.  Past Medical History Past Medical History:  Diagnosis Date   Anxiety    Appendicitis 07/27/2019   Cyst of skin and subcutaneous tissue 06/22/2016   Eczema    Esophageal stricture    Feeling like committing suicide 06/17/2022   GERD (gastroesophageal reflux disease)    Medication monitoring encounter 11/20/2017   Morbid obesity (HCC) 11/21/2018   BMI >35 with GERD and hyperlipidemia   Non-celiac gluten sensitivity 12/29/2015   Preventative health care 07/05/2017   TMJ (dislocation of temporomandibular joint)    Weight gain 11/14/2017      Past Surgical History:  Procedure Laterality Date   APPENDECTOMY  2020   ESOPHAGEAL DILATION     2020   ESOPHAGOGASTRODUODENOSCOPY (EGD) WITH PROPOFOL N/A 02/08/2019   Procedure: ESOPHAGOGASTRODUODENOSCOPY (EGD) WITH PROPOFOL;  Surgeon: Wyline Mood, MD;  Location: [facility];  Service: Gastroenterology;  Laterality: N/A;   HERNIA REPAIR Right    LAPAROSCOPIC APPENDECTOMY N/A 07/27/2019   Procedure: APPENDECTOMY LAPAROSCOPIC;  Surgeon: Leafy Ro, MD;  Location: [facility];  Service: General;  Laterality: N/A;   TYMPANOSTOMY TUBE PLACEMENT     as a child    Allergies  Allergen Reactions   Other Nausea Only    Current Outpatient Medications  Medication Sig Dispense Refill   atorvastatin (LIPITOR) 20 MG tablet TAKE 1 TABLET BY MOUTH EVERYDAY AT BEDTIME 90 tablet 3   betamethasone dipropionate 0.05 % cream Apply  topically 2 (two) times daily. Use SPARINGLY; can cause stretch marks 90 g 2   cetirizine (ZYRTEC) 10 MG tablet Take 1 tablet (10 mg total) by mouth daily. 90 tablet 3   gabapentin (NEURONTIN) 300 MG capsule Take 300 mg by mouth 3 (three) times daily.     montelukast (SINGULAIR) 10 MG tablet Take 1 tablet (10 mg total) by mouth at bedtime. 90 tablet 3   pantoprazole (PROTONIX) 40 MG tablet Take 1 tablet (40 mg total) by mouth 2 (two) times daily. 180 tablet 3   Turmeric 500 MG TABS Take 500 mg by mouth daily.     venlafaxine XR (EFFEXOR-XR) 75 MG 24 hr capsule Take 1 capsule (75 mg total) by mouth daily with breakfast. 90 capsule 1   No current facility-administered medications for this visit.    Family History Family History  Problem Relation Age of Onset   Multiple sclerosis Mother    Hearing loss Father    Cancer Maternal Grandmother        lymphoma   Heart disease Maternal Grandfather        early 34s   Lung disease Paternal Grandmother    Blindness Paternal Grandmother    Parkinson's disease Paternal Grandfather    COPD Neg Hx    Diabetes Neg Hx    Hypertension Neg Hx    Stroke Neg Hx    Colon cancer Neg Hx       Social History Social History   Tobacco  Use   Smoking status: Former    Packs/day: 1.00    Years: 15.00    Total pack years: 15.00    Types: Cigarettes    Quit date: 12/23/2007    Years since quitting: 14.6   Smokeless tobacco: Former    Types: Chew    Quit date: 2009  Vaping Use   Vaping Use: Never used  Substance Use Topics   Alcohol use: No   Drug use: No        Review of Systems  Constitutional: Negative.   HENT: Negative.    Eyes: Negative.   Respiratory: Negative.    Cardiovascular: Negative.   Gastrointestinal: Negative.   Genitourinary: Negative.   Skin: Negative.   Neurological: Negative.   Psychiatric/Behavioral: Negative.        Physical Exam Blood pressure 128/85, pulse 66, temperature 98.7 F (37.1 C), temperature source  Oral, height 5\' 11"  (1.803 m), weight 194 lb 9.6 oz (88.3 kg), SpO2 98 %. Last Weight  Most recent update: 08/04/2022  9:27 AM    Weight  88.3 kg (194 lb 9.6 oz)             CONSTITUTIONAL: Well developed, and nourished, appropriately responsive and aware without distress.   EYES: Sclera non-icteric.   EARS, NOSE, MOUTH AND THROAT:  The oropharynx is clear. Oral mucosa is pink and moist.     Hearing is intact to voice.  NECK: Trachea is midline, and there is no jugular venous distension.  LYMPH NODES:  Lymph nodes in the neck are not enlarged. RESPIRATORY:  Lungs are clear, and breath sounds are equal bilaterally. Normal respiratory effort without pathologic use of accessory muscles. CARDIOVASCULAR: Heart is regular in rate and rhythm. GI: The abdomen is soft, nontender, and nondistended. There were no palpable masses. I did not appreciate hepatosplenomegaly. There were normal bowel sounds. GU: There is a well-defined lump in the right groin, that does not feel contiguous with a possible hernia, and does not actually change with Valsalva.  I cannot appreciate a hernia through the external ring.  The mass itself may be either lipomatous, or potentially cystic.  There is no similar lesion on the left side.  This is immediately under the scar from his right groin hernia surgery.  I am not able to reduce this. MUSCULOSKELETAL:  Symmetrical muscle tone appreciated in all four extremities.    SKIN: Skin turgor is normal. No pathologic skin lesions appreciated.  NEUROLOGIC:  Motor and sensation appear grossly normal.  Cranial nerves are grossly without defect. PSYCH:  Alert and oriented to person, place and time. Affect is appropriate for situation.  Data Reviewed I have personally reviewed what is currently available of the patient's imaging, recent labs and medical records.   Labs:     Latest Ref Rng & Units 05/05/2022    8:56 AM 02/18/2020    9:01 AM 07/27/2019    9:48 AM  CBC  WBC 3.8 - 10.8  Thousand/uL 8.1  7.2  16.6   Hemoglobin 13.2 - 17.1 g/dL 07/29/2019  78.4  69.6   Hematocrit 38.5 - 50.0 % 40.5  42.3  42.3   Platelets 140 - 400 Thousand/uL 310  286  283       Latest Ref Rng & Units 05/05/2022    8:56 AM 02/26/2021    8:42 AM 02/18/2020    9:01 AM  CMP  Glucose 65 - 99 mg/dL 89  90  88   BUN 7 - 25  mg/dL 13  16  14    Creatinine 0.60 - 1.29 mg/dL  5.57  3.22   Sodium 135 - 146 mmol/L 140  141  140   Potassium 3.5 - 5.3 mmol/L 4.4  4.2  4.8   Chloride 98 - 110 mmol/L 102  105  103   CO2 20 - 32 mmol/L 26  27  31    Calcium 8.6 - 10.3 mg/dL 0.25  9.5    Total Protein 6.1 - 8.1 g/dL 6.9  6.6  7.0   Total Bilirubin 0.2 - 1.2 mg/dL 0.4  0.3  0.4   AST 10 - 40 U/L 22  19  22    ALT 9 - 46 U/L 34  22  38       Imaging:  Within last 24 hrs: No results found.  Assessment    Right groin mass, possible recurrent right inguinal hernia. Patient Active Problem List   Diagnosis Date Noted   Cervical radiculopathy 07/28/2022   Bilateral carpal tunnel syndrome 06/17/2022   Circadian rhythm sleep disorder, shift work type 06/17/2022   Low back pain 06/17/2022   Moderate episode of recurrent major depressive disorder (HCC) 06/17/2022   Plantar fascia rupture 01/12/2022   Hypertriglyceridemia 11/26/2018   Elevated glucose 11/26/2018   Allergic rhinitis 11/14/2017   Hyperlipidemia 11/14/2017   TMJ (temporomandibular joint syndrome) 06/22/2016   Shoulder pain, bilateral 06/22/2016   Social anxiety disorder 12/29/2015   Allergy to perfume 12/29/2015   History of esophageal stricture 12/29/2015   Eczema, allergic 12/29/2015   Gastroesophageal reflux 12/29/2015    Plan    We will obtain ultrasound evaluation of the right groin to determine nature of the mass.  We will hope to save this gentleman for missing any additional work by phone call to share results, he also has access to 12/31/2015.  Face-to-face time spent with the patient and accompanying care providers(if  present) was 30 minutes, with more than 50% of the time spent counseling, educating, and coordinating care of the patient.    These notes generated with voice recognition software. I apologize for typographical errors.  12/31/2015 M.D., FACS 08/04/2022, 9:29 AM

## 2022-08-04 NOTE — Telephone Encounter (Signed)
Pt scheduled to see the casting dept 8.25.2023 in [place].

## 2022-08-05 ENCOUNTER — Ambulatory Visit (INDEPENDENT_AMBULATORY_CARE_PROVIDER_SITE_OTHER): Payer: BC Managed Care – PPO | Admitting: Podiatry

## 2022-08-05 DIAGNOSIS — M722 Plantar fascial fibromatosis: Secondary | ICD-10-CM

## 2022-08-05 NOTE — Progress Notes (Signed)
Patient presents today to pick up custom orthotics.   He has been wearing "super feet" inserts that have the highest arch support available over the counter.   The new orthotics fit well in his [place]. He walked around the office for a few minutes, and was pleased with the fit and feel of the custom orthotics.   Advised to gradually break them in over several days. Patient voiced understanding.  He will keep his appointment as scheduled with Dr. Al Corpus next week.

## 2022-08-10 ENCOUNTER — Encounter: Payer: Self-pay | Admitting: Podiatry

## 2022-08-10 ENCOUNTER — Ambulatory Visit (INDEPENDENT_AMBULATORY_CARE_PROVIDER_SITE_OTHER): Payer: BC Managed Care – PPO | Admitting: Podiatry

## 2022-08-10 DIAGNOSIS — M722 Plantar fascial fibromatosis: Secondary | ICD-10-CM

## 2022-08-10 MED ORDER — TRIAMCINOLONE ACETONIDE 40 MG/ML IJ SUSP
40.0000 mg | Freq: Once | INTRAMUSCULAR | Status: AC
  Administered 2022-08-10: 40 mg

## 2022-08-10 MED ORDER — CELECOXIB 100 MG PO CAPS: 100.0000 mg | ORAL_CAPSULE | Freq: Two times a day (BID) | ORAL | 3 refills | Status: DC

## 2022-08-10 NOTE — Progress Notes (Signed)
He presents today states that he has recently picked up his orthotics but his heels really never got much better.  He states that he was unable to take the meloxicam and would like to try something else if possible.  Objective: Vital signs are stable he is alert and oriented x3.  Pulses are palpable.  He has pain on palpation medial calcaneal tubercles bilateral foot radiating into the medial longitudinal arches.  Assessment: Planter fasciitis bilateral.  Plan: Continue use of the orthotics.  Started him on Celebrex 100 mg 1 p.o. twice daily call with upset stomach or any concerns regarding this.  Also I am going to reinject his bilateral heels today 20 mg Kenalog 5 mg Marcaine and follow-up with him as needed.

## 2022-08-11 ENCOUNTER — Ambulatory Visit
Admission: RE | Admit: 2022-08-11 | Discharge: 2022-08-11 | Disposition: A | Discharge status: Home / Self Care | Payer: BC Managed Care – PPO | Source: Ambulatory Visit | Attending: Surgery | Admitting: Surgery

## 2022-08-11 DIAGNOSIS — R1031 Right lower quadrant pain: Secondary | ICD-10-CM | POA: Insufficient documentation

## 2022-08-17 ENCOUNTER — Telehealth: Payer: Self-pay | Admitting: *Deleted

## 2022-08-17 NOTE — Telephone Encounter (Signed)
Patient called and stated that an appointment was made for him to come talk to him about his U/S report but patient stated that right now he is just not able to come in for an appointment and the plan that you guys agreed on was to call him to talk about his U/S report over the phone. Please call and advise

## 2022-08-19 ENCOUNTER — Telehealth: Payer: Self-pay | Admitting: Podiatry

## 2022-08-19 NOTE — Telephone Encounter (Signed)
Pt stated he left Vm regarding inserts. He would like to order a second pair of custom inserts.

## 2022-08-24 DIAGNOSIS — M722 Plantar fascial fibromatosis: Secondary | ICD-10-CM

## 2022-08-31 ENCOUNTER — Telehealth: Payer: Self-pay | Admitting: Podiatry

## 2022-08-31 NOTE — Telephone Encounter (Signed)
Lvm for patient to call and schedule to [facility] 

## 2022-09-20 ENCOUNTER — Ambulatory Visit: Payer: BC Managed Care – PPO | Admitting: Surgery

## 2022-09-23 ENCOUNTER — Ambulatory Visit: Payer: BC Managed Care – PPO | Admitting: Family Medicine

## 2022-10-27 NOTE — Patient Instructions (Addendum)
[email or web address]   Preventive Care 100-45 Years Old, Male Preventive care refers to lifestyle choices and visits with your health care provider that can promote health and wellness. Preventive care visits are also called wellness exams. What can I expect for my preventive care visit? Counseling During your preventive care visit, your health care provider may ask about your: Medical history, including: Past medical problems. Family medical history. Current health, including: Emotional well-being. Home life and relationship well-being. Sexual activity. Lifestyle, including: Alcohol, nicotine or tobacco, and drug use. Access to firearms. Diet, exercise, and sleep habits. Safety issues such as seatbelt and bike helmet use. Sunscreen use. Work and work Astronomer. Physical exam Your health care provider will check your: Height and weight. These may be used to calculate your BMI (body mass index). BMI is a measurement that tells if you are at a healthy weight. Waist circumference. This measures the distance around your waistline. This measurement also tells if you are at a healthy weight and may help predict your risk of certain diseases, such as type 2 diabetes and high blood pressure. Heart rate and blood pressure. Body temperature. Skin for abnormal spots. What immunizations do I need?  Vaccines are usually given at various ages, according to a schedule. Your health care provider will recommend vaccines for you based on your age, medical history, and lifestyle or other factors, such as travel or where you work. What tests do I need? Screening Your health care provider may recommend screening tests for certain conditions. This may include: Lipid and cholesterol levels. Diabetes screening. This is done by checking your blood sugar (glucose) after you have not eaten for a while (fasting). Hepatitis B test. Hepatitis C test. HIV  (human immunodeficiency virus) test. STI (sexually transmitted infection) testing, if you are at risk. Lung cancer screening. Prostate cancer screening. Colorectal cancer screening. Talk with your health care provider about your test results, treatment options, and if necessary, the need for more tests. Follow these instructions at home: Eating and drinking  Eat a diet that includes fresh fruits and vegetables, whole grains, lean protein, and low-fat dairy products. Take vitamin and mineral supplements as recommended by your health care provider. Do not drink alcohol if your health care provider tells you not to drink. If you drink alcohol: Limit how much you have to 0-2 drinks a day. Know how much alcohol is in your drink. In the [place] one drink equals one 12 oz bottle of beer (355 mL), one 5 oz glass of wine (148 mL), or one 1 oz glass of hard liquor (44 mL). Lifestyle Brush your teeth every morning and night with fluoride toothpaste. Floss one time each day. Exercise for at least 30 minutes 5 or more days each week. Do not use any products that contain nicotine or tobacco. These products include cigarettes, chewing tobacco, and vaping devices, such as e-cigarettes. If you need help quitting, ask your health care provider. Do not use drugs. If you are sexually active, practice safe sex. Use a condom or other form of protection to prevent STIs. Take aspirin only as told by your health care provider. Make sure that you understand how much to take and what form to take. Work with your health care provider to find out whether it is safe and beneficial for you to take aspirin daily. Find healthy ways to manage stress, such as: Meditation, yoga, or listening to music. Journaling. Talking to a trusted person. Spending time with friends and family. Minimize exposure to UV  radiation to reduce your risk of skin cancer. Safety Always wear your seat belt while driving or riding in a vehicle. Do  not drive: If you have been drinking alcohol. Do not ride with someone who has been drinking. When you are tired or distracted. While texting. If you have been using any mind-altering substances or drugs. Wear a helmet and other protective equipment during sports activities. If you have firearms in your house, make sure you follow all gun safety procedures. What's next? Go to your health care provider once a year for an annual wellness visit. Ask your health care provider how often you should have your eyes and teeth checked. Stay up to date on all vaccines. This information is not intended to replace advice given to you by your health care provider. Make sure you discuss any questions you have with your health care provider. Document Revised: 05/26/2021 Document Reviewed: 05/26/2021 Elsevier Patient Education  2023 [employer].

## 2022-10-31 ENCOUNTER — Encounter: Payer: Self-pay | Admitting: Family Medicine

## 2022-10-31 ENCOUNTER — Ambulatory Visit (INDEPENDENT_AMBULATORY_CARE_PROVIDER_SITE_OTHER): Payer: BC Managed Care – PPO | Admitting: Family Medicine

## 2022-10-31 VITALS — BP 124/80 | HR 85 | Temp 97.7°F | Resp 16 | Ht 71.0 in | Wt 192.2 lb

## 2022-10-31 DIAGNOSIS — Z0000 Encounter for general adult medical examination without abnormal findings: Secondary | ICD-10-CM

## 2022-10-31 DIAGNOSIS — M5412 Radiculopathy, cervical region: Secondary | ICD-10-CM | POA: Diagnosis not present

## 2022-10-31 DIAGNOSIS — E782 Mixed hyperlipidemia: Secondary | ICD-10-CM

## 2022-10-31 DIAGNOSIS — F331 Major depressive disorder, recurrent, moderate: Secondary | ICD-10-CM | POA: Diagnosis not present

## 2022-10-31 MED ORDER — VENLAFAXINE HCL ER 75 MG PO CP24: 75.0000 mg | ORAL_CAPSULE | Freq: Every day | ORAL | 3 refills | Status: DC

## 2022-10-31 MED ORDER — PREGABALIN 50 MG PO CAPS: ORAL_CAPSULE | ORAL | 1 refills | Status: DC

## 2022-10-31 NOTE — Progress Notes (Signed)
Patient: Joe Patterson, Male    DOB: Jul 22, 1976, 46 y.o.   MRN: [id number] Delsa Grana, PA-C Visit Date: 10/31/2022  Today's Provider: Delsa Grana, PA-C   Chief Complaint  Patient presents with   Annual Exam   Subjective:   Annual physical exam:  Joe Patterson is a 46 y.o. male who presents today for health maintenance and annual & complete physical exam.   Exercise/Activity:  exercising a few times  Diet/nutrition:  doing well Sleep:  sleeping well, not using sleep meds  [place]: No Food Insecurity (10/31/2022)  Housing: Low Risk  (10/31/2022)  Transportation Needs: No Transportation Needs (10/31/2022)  Utilities: Not At Risk (10/31/2022)  Alcohol Screen: Low Risk  (10/31/2022)  Depression (PHQ2-9): Low Risk  (10/31/2022)  Financial Resource Strain: Low Risk  (10/31/2022)  Physical Activity: Insufficiently Active (10/31/2022)  Social Connections: Moderately Isolated (10/31/2022)  Stress: No Stress Concern Present (10/31/2022)  Tobacco Use: Medium Risk (10/31/2022)     USPSTF grade A and B recommendations - reviewed and addressed today  Depression:  Phq 9 completed today by patient, was reviewed by me with patient in the room, score is  negative, pt feels     10/31/2022    8:43 AM 07/28/2022    2:03 PM 07/19/2022    3:12 PM  Depression screen PHQ 2/9  Decreased Interest 0 0 0  Down, Depressed, Hopeless 0 0 0  PHQ - 2 Score 0 0 0  Altered sleeping 0 0 0  Tired, decreased energy 0 0 0  Change in appetite 0 0 0  Feeling bad or failure about yourself  0 0 0  Trouble concentrating 0 0 0  Moving slowly or fidgety/restless 0 0 0  Suicidal thoughts 0 0 0  PHQ-9 Score 0 0 0  Difficult doing work/chores Not difficult at all Not difficult at all Not difficult at all    Hep C Screening: done  STD testing and prevention (HIV/chl/gon/syphilis): done   Intimate partner violence:  none  Advanced Care Planning:  A voluntary  discussion about advance care planning including the explanation and discussion of advance directives.  Discussed health care proxy and Living will, and the patient was able to identify a health care proxy as dad Aslan Himes or sister Bobbye Morton.  Patient does not have a living will at present time. If patient does have living will, I have requested they bring this to the clinic to be scanned in to their chart.  Health Maintenance  Topic Date Due   COVID-19 Vaccine (3 - Moderna series) 04/08/2020   Fecal DNA (Cologuard)  Never done   INFLUENZA VACCINE  Completed   Hepatitis C Screening  Completed   HIV Screening  Completed   HPV VACCINES  Aged Out    Skin cancer:   Pt reports denies hx of skin cancer, suspicious lesions/biopsies in the past.  Colorectal cancer:  colonoscopy is due Pt has cologuard already delivered he plans to complete in a few months Pt denies blood in stool  Prostate cancer:  Prostate cancer screening with PSA: Discussed risks and benefits of PSA testing and provided handout. Pt declines to have PSA drawn today. No results found for: "PSA"  Urinary Symptoms:  none  Lung cancer:   Low Dose CT Chest recommended if Age 74-80 years, 20 pack-year currently smoking OR have quit w/in 15years. Patient does not qualify.   Social History   Tobacco Use   Smoking status:  Former    Packs/day: 1.00    Years: 15.00    Total pack years: 15.00    Types: Cigarettes    Quit date: 12/23/2007    Years since quitting: 14.8   Smokeless tobacco: Former    Types: Chew    Quit date: 2009  Substance Use Topics   Alcohol use: No     Alcohol screening: Personnel officer Visit [facility]31/[facility]  AUDIT-C Score 0       AAA:  The USPSTF recommends one-time screening with ultrasonography in men ages 35 to 30 years who have ever smoked  ECG:  not indicated   Blood pressure/Hypertension: BP Readings from Last 3 Encounters:  10/31/22  124/80  08/04/22 128/85  07/28/22 116/70   Weight/Obesity: Wt Readings from Last 3 Encounters:  10/31/22 192 lb 3.2 oz (87.2 kg)  08/04/22 194 lb 9.6 oz (88.3 kg)  07/28/22 190 lb 4.8 oz (86.3 kg)   BMI Readings from Last 3 Encounters:  10/31/22 26.81 kg/m  08/04/22 27.14 kg/m  07/28/22 26.54 kg/m    Lipids:  Lab Results  Component Value Date   CHOL 110 05/05/2022   CHOL 126 02/26/2021   CHOL 119 02/18/2020   Lab Results  Component Value Date   HDL 51 05/05/2022   HDL 44 02/26/2021   HDL 38 (L) 02/18/2020   Lab Results  Component Value Date   LDLCALC 44 05/05/2022   LDLCALC 63 02/26/2021   LDLCALC 63 02/18/2020   Lab Results  Component Value Date   TRIG 71 05/05/2022   TRIG 107 02/26/2021   TRIG 101 02/18/2020   Lab Results  Component Value Date   CHOLHDL 2.2 05/05/2022   CHOLHDL 2.9 02/26/2021   CHOLHDL 3.1 02/18/2020   No results found for: "LDLDIRECT" Based on the results of lipid panel his/her cardiovascular risk factor ( using Effort )  in the next 10 years is : The ASCVD Risk score (Arnett DK, et al., 2019) failed to calculate for the following reasons:   The valid total cholesterol range is 130 to 320 mg/dL Glucose:  Glucose, Bld  Date Value Ref Range Status  05/05/2022 89 65 - 99 mg/dL Final    Comment:    .            Fasting reference interval .   02/26/2021 90 65 - 99 mg/dL Final    Comment:    .            Fasting reference interval .   02/18/2020 88 65 - 99 mg/dL Final    Comment:    .            Fasting reference interval .     Social History       Social History   Socioeconomic History   Marital status: Single    Spouse name: Not on file   Number of children: Not on file   Years of education: 12   Highest education level: Bachelor's degree (e.g., BA, AB, BS)  Occupational History   Occupation: [employer]  Tobacco Use   Smoking status: Former    Packs/day: 1.00    Years: 15.00    Total pack years: 15.00     Types: Cigarettes    Quit date: 12/23/2007    Years since quitting: 14.8   Smokeless tobacco: Former    Types: Chew    Quit date: 2009  Vaping Use   Vaping  Use: Never used  Substance and Sexual Activity   Alcohol use: No   Drug use: No   Sexual activity: Yes  Other Topics Concern   Not on file  Social History Narrative   Not on file   Social Determinants of Health   Financial Resource Strain: Low Risk  (10/31/2022)   Overall Financial Resource Strain (CARDIA)    Difficulty of Paying Living Expenses: Not hard at all  Food Insecurity: No Food Insecurity (10/31/2022)   Hunger Vital Sign    Worried About Running Out of Food in the Last Year: Never true    Ran Out of Food in the Last Year: Never true  Transportation Needs: No Transportation Needs (10/31/2022)   PRAPARE - Hydrologist (Medical): No    Lack of Transportation (Non-Medical): No  Physical Activity: Insufficiently Active (10/31/2022)   Exercise Vital Sign    Days of Exercise per Week: 1 day    Minutes of Exercise per Session: 30 min  Stress: No Stress Concern Present (10/31/2022)   [place]    Feeling of Stress : Not at all  Social Connections: Moderately Isolated (10/31/2022)   Social Connection and Isolation Panel [NHANES]    Frequency of Communication with Friends and Family: More than three times a week    Frequency of Social Gatherings with Friends and Family: Three times a week    Attends Religious Services: 1 to 4 times per year    Active Member of Clubs or Organizations: No    Attends Archivist Meetings: Never    Marital Status: Never married    Family History        Family History  Problem Relation Age of Onset   Multiple sclerosis Mother    Hearing loss Father    Cancer Maternal Grandmother        lymphoma   Heart disease Maternal Grandfather        early 83s   Lung disease Paternal  Grandmother    Blindness Paternal Grandmother    Parkinson's disease Paternal Grandfather    COPD Neg Hx    Diabetes Neg Hx    Hypertension Neg Hx    Stroke Neg Hx    Colon cancer Neg Hx     Patient Active Problem List   Diagnosis Date Noted   Mass of right inguinal region 08/04/2022   Cervical radiculopathy 07/28/2022   Bilateral carpal tunnel syndrome 06/17/2022   Circadian rhythm sleep disorder, shift work type 06/17/2022   Low back pain 06/17/2022   Moderate episode of recurrent major depressive disorder ([place]) 06/17/2022   Plantar fascia rupture 01/12/2022   Hypertriglyceridemia 11/26/2018   Elevated glucose 11/26/2018   Allergic rhinitis 11/14/2017   Hyperlipidemia 11/14/2017   TMJ (temporomandibular joint syndrome) 06/22/2016   Shoulder pain, bilateral 06/22/2016   Social anxiety disorder 12/29/2015   Allergy to perfume 12/29/2015   History of esophageal stricture 12/29/2015   Eczema, allergic 12/29/2015   Gastroesophageal reflux 12/29/2015    Past Surgical History:  Procedure Laterality Date   APPENDECTOMY  2020   ESOPHAGEAL DILATION     2020   ESOPHAGOGASTRODUODENOSCOPY (EGD) WITH PROPOFOL N/A 02/08/2019   Procedure: ESOPHAGOGASTRODUODENOSCOPY (EGD) WITH PROPOFOL;  Surgeon: Jonathon Bellows, MD;  Location: [facility] ENDOSCOPY;  Service: Gastroenterology;  Laterality: N/A;   HERNIA REPAIR Right    LAPAROSCOPIC APPENDECTOMY N/A 07/27/2019   Procedure: APPENDECTOMY LAPAROSCOPIC;  Surgeon: Jules Husbands, MD;  Location: [facility] ORS;  Service: General;  Laterality: N/A;   TYMPANOSTOMY TUBE PLACEMENT     as a child     Current Outpatient Medications:    atorvastatin (LIPITOR) 20 MG tablet, TAKE 1 TABLET BY MOUTH EVERYDAY AT BEDTIME, Disp: 90 tablet, Rfl: 3   betamethasone dipropionate 0.05 % cream, Apply topically 2 (two) times daily. Use SPARINGLY; can cause stretch marks, Disp: 90 g, Rfl: 2   celecoxib (CELEBREX) 100 MG capsule, Take 1 capsule (100 mg total) by mouth 2 (two)  times daily., Disp: 60 capsule, Rfl: 3   cetirizine (ZYRTEC) 10 MG tablet, Take 1 tablet (10 mg total) by mouth daily., Disp: 90 tablet, Rfl: 3   montelukast (SINGULAIR) 10 MG tablet, Take 1 tablet (10 mg total) by mouth at bedtime., Disp: 90 tablet, Rfl: 3   pantoprazole (PROTONIX) 40 MG tablet, Take 1 tablet (40 mg total) by mouth 2 (two) times daily., Disp: 180 tablet, Rfl: 3   pregabalin (LYRICA) 50 MG capsule, , Disp: , Rfl:    Turmeric 500 MG TABS, Take 500 mg by mouth daily., Disp: , Rfl:    venlafaxine XR (EFFEXOR-XR) 75 MG 24 hr capsule, Take 1 capsule (75 mg total) by mouth daily with breakfast., Disp: 90 capsule, Rfl: 1   gabapentin (NEURONTIN) 300 MG capsule, Take 300 mg by mouth 3 (three) times daily., Disp: , Rfl:   Allergies  Allergen Reactions   Other Nausea Only    Patient Care Team: Delsa Grana, PA-C as PCP - General (Family Medicine)   Chart Review: I personally reviewed active problem list, medication list, allergies, family history, social history, health maintenance, notes from last encounter, lab results, imaging with the patient/caregiver today.   Review of Systems  Constitutional: Negative.   HENT: Negative.    Eyes: Negative.   Respiratory: Negative.    Cardiovascular: Negative.   Gastrointestinal: Negative.   Endocrine: Negative.   Genitourinary: Negative.   Musculoskeletal: Negative.   Skin: Negative.   Allergic/Immunologic: Negative.   Neurological: Negative.   Hematological: Negative.   Psychiatric/Behavioral: Negative.    All other systems reviewed and are negative.         Objective:   Vitals:  Vitals:   10/31/22 0847  BP: 124/80  Pulse: 85  Resp: 16  Temp: 97.7 F (36.5 C)  TempSrc: Oral  SpO2: 99%  Weight: 192 lb 3.2 oz (87.2 kg)  Height: 5' 11" (1.803 m)    Body mass index is 26.81 kg/m.  Physical Exam Vitals and nursing note reviewed.  Constitutional:      General: He is not in acute distress.    Appearance: Normal  appearance. He is well-developed and normal weight. He is not ill-appearing, toxic-appearing or diaphoretic.  HENT:     Head: Normocephalic and atraumatic.     Jaw: No trismus.     Right Ear: Tympanic membrane, ear canal and external ear normal.     Left Ear: Tympanic membrane, ear canal and external ear normal.     Nose: Congestion present. No mucosal edema or rhinorrhea.     Right Sinus: No maxillary sinus tenderness or frontal sinus tenderness.     Left Sinus: No maxillary sinus tenderness or frontal sinus tenderness.     Mouth/Throat:     Mouth: Mucous membranes are moist.     Pharynx: Oropharynx is clear. Uvula midline. No oropharyngeal exudate, posterior oropharyngeal erythema or uvula swelling.  Eyes:     General: Lids are normal. No scleral  icterus.       Right eye: No discharge.        Left eye: No discharge.     Conjunctiva/sclera: Conjunctivae normal.     Pupils: Pupils are equal, round, and reactive to light.  Neck:     Trachea: Trachea and phonation normal. No tracheal deviation.  Cardiovascular:     Rate and Rhythm: Normal rate and regular rhythm.     Pulses: Normal pulses.          Radial pulses are 2+ on the right side and 2+ on the left side.       Posterior tibial pulses are 2+ on the right side and 2+ on the left side.     Heart sounds: Normal heart sounds. No murmur heard.    No friction rub. No gallop.  Pulmonary:     Effort: Pulmonary effort is normal.     Breath sounds: Normal breath sounds. No wheezing, rhonchi or rales.  Abdominal:     General: Bowel sounds are normal. There is no distension.     Palpations: Abdomen is soft.     Tenderness: There is no abdominal tenderness. There is no guarding or rebound.  Musculoskeletal:     Cervical back: Normal range of motion and neck supple.  Skin:    General: Skin is warm and dry.     Coloration: Skin is not jaundiced or pale.     Findings: No lesion or rash.  Neurological:     Mental Status: He is alert and  oriented to person, place, and time. Mental status is at baseline.     Gait: Gait normal.  Psychiatric:        Mood and Affect: Mood normal.        Speech: Speech normal.        Behavior: Behavior normal.      No results found for this or any previous visit (from the past 2160 hour(s)).  Fall Risk:    10/31/2022    8:43 AM 08/04/2022    9:24 AM 07/28/2022    2:03 PM 07/19/2022    3:12 PM 06/29/2022   10:31 AM  Fall Risk   Falls in the past year? 0 0 0 0 0  Number falls in past yr: 0  0 0 0  Injury with Fall? 0  0 0 0  Risk for fall due to : No Fall Risks  No Fall Risks No Fall Risks   Follow up Falls prevention discussed;Education provided;Falls evaluation completed  Falls prevention discussed;Education provided Falls prevention discussed;Education provided Falls evaluation completed    Functional Status Survey: Is the patient deaf or have difficulty hearing?: No Does the patient have difficulty seeing, even when wearing glasses/contacts?: No Does the patient have difficulty concentrating, remembering, or making decisions?: No Does the patient have difficulty walking or climbing stairs?: No Does the patient have difficulty dressing or bathing?: No Does the patient have difficulty doing errands alone such as visiting a doctor's office or shopping?: No   Assessment & Plan:    CPE completed today  Prostate cancer screening and PSA options (with potential risks and benefits of testing vs not testing) were discussed along with recent recs/guidelines, shared decision making and handout/information given to pt today  USPSTF grade A and B recommendations reviewed with patient; age-appropriate recommendations, preventive care, screening tests, etc discussed and encouraged; healthy living encouraged; see AVS for patient education given to patient  Discussed importance of 150 minutes of physical activity  weekly, AHA exercise recommendations given to pt in AVS/handout  Discussed  importance of healthy diet:  eating lean meats and proteins, avoiding trans fats and saturated fats, avoid simple sugars and excessive carbs in diet, eat 6 servings of fruit/vegetables daily and drink plenty of water and avoid sweet beverages.  DASH diet reviewed if pt has HTN  Recommended pt to do annual eye exam and routine dental exams/cleanings  Advance Care planning information and packet discussed and offered today, encouraged pt to discuss with family members/spouse/partner/friends and complete Advanced directive packet and bring copy to office   Reviewed Health Maintenance: Health Maintenance  Topic Date Due   COVID-19 Vaccine (3 - Moderna series) 04/08/2020   Fecal DNA (Cologuard)  Never done   INFLUENZA VACCINE  Completed   Hepatitis C Screening  Completed   HIV Screening  Completed   HPV VACCINES  Aged Out    Immunizations: Immunization History  Administered Date(s) Administered   DTaP 07/22/1977, 10/03/1977, 05/11/1978, 10/31/1978   Hepatitis B 12/27/1994, 01/31/1995, 10/27/1995   Hepatitis B, PED/ADOLESCENT 12/27/1994, 01/31/1995, 10/27/1995   IPV 05/11/1977, 07/22/1977, 10/31/1978, 03/12/1982   Influenza,inj,Quad PF,6+ Mos 09/13/2018, 08/01/2019, 08/27/2020   Influenza,inj,quad, With Preservative 11/12/2016   Influenza-Unspecified 09/12/2015, 09/08/2021   MMR 06/22/1978, 11/18/1997   Moderna Sars-Covid-2 Vaccination 01/15/2020, 02/12/2020   PPD Test 03/05/2021   Td 03/12/1982, 11/18/1997   Tdap 12/23/2015   Vaccines:  HPV: up to at age 54 , ask insurance if age between 53-45  Shingrix: 68-64 yo and ask insurance if covered when patient above 36 yo Pneumonia: not due per age educated and discussed with patient. Flu: done - need to update chart educated and discussed with patient. COVID:  updated - just did booster 4-5 weeks ago     ICD-10-CM   1. Annual physical exam  A54.09 COMPLETE METABOLIC PANEL WITH GFR    CBC with Differential/Platelet    2. Mixed  hyperlipidemia  W11.9 COMPLETE METABOLIC PANEL WITH GFR    3. Moderate episode of recurrent major depressive disorder (HCC)  F33.1 venlafaxine XR (EFFEXOR-XR) 75 MG 24 hr capsule   stable, doing well on venlafaxine    4. Cervical radiculopathy  M54.12 pregabalin (LYRICA) 50 MG capsule   Pt asked for primary to take of lyrica Rx - also wanted higher dose - on 50 mg BID, can try 100 big BID or 50 am, 50 qnoon and 100 qhs    Controlled substance database reviewed with pt regarding lyrica - discussed different doses that he can try - he will need to do quick f/up regarding pain/effectiveness prior to next refill.  Due for cologuard - scared it will be positive and he will need colonoscopy, encouraged him to complete  Return in about 6 months (around 05/01/2023) for Routine follow-up HLD.     Delsa Grana, PA-C 10/31/22 9:02 AM  [facility]

## 2022-11-01 LAB — COMPLETE METABOLIC PANEL WITH GFR
AG Ratio: 2.9 (calc) — ABNORMAL HIGH (ref 1.0–2.5)
ALT: 36 U/L (ref 9–46)
AST: 23 U/L (ref 10–40)
Albumin: 5.2 g/dL — ABNORMAL HIGH (ref 3.6–5.1)
Alkaline phosphatase (APISO): 63 U/L (ref 36–130)
BUN: 19 mg/dL (ref 7–25)
CO2: 31 mmol/L (ref 20–32)
Calcium: 9.9 mg/dL (ref 8.6–10.3)
Chloride: 102 mmol/L (ref 98–110)
Creat: 0.8 mg/dL (ref 0.60–1.29)
Globulin: 1.8 g/dL (calc) — ABNORMAL LOW (ref 1.9–3.7)
Glucose, Bld: 83 mg/dL (ref 65–99)
Potassium: 4.7 mmol/L (ref 3.5–5.3)
Sodium: 140 mmol/L (ref 135–146)
Total Bilirubin: 0.4 mg/dL (ref 0.2–1.2)
Total Protein: 7 g/dL (ref 6.1–8.1)
eGFR: 111 mL/min/{1.73_m2} (ref >=60)

## 2022-11-01 LAB — CBC WITH DIFFERENTIAL/PLATELET
Absolute Monocytes: 471 cells/uL (ref 200–950)
Basophils Absolute: 19 cells/uL (ref 0–200)
Basophils Relative: 0.3 %
Eosinophils Absolute: 81 cells/uL (ref 15–500)
Eosinophils Relative: 1.3 %
HCT: 41.1 % (ref 38.5–50.0)
Hemoglobin: 14 g/dL (ref 13.2–17.1)
Lymphs Abs: 1035 cells/uL (ref 850–3900)
MCH: 31 pg (ref 27.0–33.0)
MCHC: 34.1 g/dL (ref 32.0–36.0)
MCV: 91.1 fL (ref 80.0–100.0)
MPV: 10.6 fL (ref 7.5–12.5)
Monocytes Relative: 7.6 %
Neutro Abs: 4594 cells/uL (ref 1500–7800)
Neutrophils Relative %: 74.1 %
Platelets: 257 10*3/uL (ref 140–400)
RBC: 4.51 10*6/uL (ref 4.20–5.80)
RDW: 11.8 % (ref 11.0–15.0)
Total Lymphocyte: 16.7 %
WBC: 6.2 10*3/uL (ref 3.8–10.8)

## 2022-11-23 ENCOUNTER — Other Ambulatory Visit: Payer: Self-pay | Admitting: Podiatry

## 2023-03-24 LAB — COLOGUARD: COLOGUARD: NEGATIVE

## 2023-04-04 ENCOUNTER — Ambulatory Visit: Payer: BC Managed Care – PPO | Admitting: Family Medicine

## 2023-04-04 ENCOUNTER — Encounter: Payer: Self-pay | Admitting: Family Medicine

## 2023-04-04 VITALS — BP 130/84 | HR 96 | Temp 98.1°F | Resp 16 | Ht 71.0 in | Wt 208.9 lb

## 2023-04-04 DIAGNOSIS — K2100 Gastro-esophageal reflux disease with esophagitis, without bleeding: Secondary | ICD-10-CM | POA: Diagnosis not present

## 2023-04-04 DIAGNOSIS — F4010 Social phobia, unspecified: Secondary | ICD-10-CM | POA: Diagnosis not present

## 2023-04-04 DIAGNOSIS — F331 Major depressive disorder, recurrent, moderate: Secondary | ICD-10-CM | POA: Diagnosis not present

## 2023-04-04 DIAGNOSIS — E782 Mixed hyperlipidemia: Secondary | ICD-10-CM | POA: Diagnosis not present

## 2023-04-04 DIAGNOSIS — M5412 Radiculopathy, cervical region: Secondary | ICD-10-CM

## 2023-04-04 DIAGNOSIS — J301 Allergic rhinitis due to pollen: Secondary | ICD-10-CM

## 2023-04-04 DIAGNOSIS — Z8719 Personal history of other diseases of the digestive system: Secondary | ICD-10-CM

## 2023-04-04 MED ORDER — VENLAFAXINE HCL ER 37.5 MG PO CP24: ORAL_CAPSULE | ORAL | 0 refills | Status: DC

## 2023-04-04 MED ORDER — ATORVASTATIN CALCIUM 20 MG PO TABS: ORAL_TABLET | ORAL | 3 refills | Status: DC

## 2023-04-04 MED ORDER — VENLAFAXINE HCL ER 75 MG PO CP24: ORAL_CAPSULE | ORAL | 0 refills | Status: DC

## 2023-04-04 MED ORDER — MONTELUKAST SODIUM 10 MG PO TABS: 10.0000 mg | ORAL_TABLET | Freq: Every day | ORAL | 3 refills | Status: DC

## 2023-04-04 MED ORDER — CELECOXIB 100 MG PO CAPS: 100.0000 mg | ORAL_CAPSULE | Freq: Two times a day (BID) | ORAL | 3 refills | Status: DC

## 2023-04-04 MED ORDER — PANTOPRAZOLE SODIUM 40 MG PO TBEC: 40.0000 mg | DELAYED_RELEASE_TABLET | Freq: Two times a day (BID) | ORAL | 3 refills | Status: DC

## 2023-04-04 NOTE — Assessment & Plan Note (Signed)
Sx controlled with protonix BID, worse sx w/o meds and with certain foods He did consult with GI a few years ago, they said ok for long term PPI BID

## 2023-04-04 NOTE — Assessment & Plan Note (Signed)
Phq 9 and GAD 7 both negative pt wishes to get off medications- see below the plan for slow dose decrease - he seems to be very sensitive to missed doses, 3 month taper planned

## 2023-04-04 NOTE — Assessment & Plan Note (Signed)
No recurrent sx, he is watching for dysphagia/food obstruction

## 2023-04-04 NOTE — Assessment & Plan Note (Signed)
Last labs well controlled, good med compliance, no SE or concerns Will keep refilling and do next f/up lipids at CPE in Nov Lab Results  Component Value Date   CHOL 110 05/05/2022   HDL 51 05/05/2022   LDLCALC 44 05/05/2022   TRIG 71 05/05/2022   CHOLHDL 2.2 05/05/2022

## 2023-04-04 NOTE — Assessment & Plan Note (Signed)
Sx stable and well controlled with current meds

## 2023-04-04 NOTE — Assessment & Plan Note (Signed)
Phq 9 reviewed and neg - see plan above

## 2023-04-04 NOTE — Assessment & Plan Note (Signed)
Continued sx but tolerable with his neck stretching/brace, celebrex and lyrica Seeing specialists

## 2023-04-04 NOTE — Progress Notes (Signed)
Name: Joe Patterson   MRN: [id number]    DOB: 05-11-1976   Date:04/04/2023       Progress Note  Chief Complaint  Patient presents with   Follow-up   Gastroesophageal Reflux   Hyperlipidemia   Depression   Anxiety     Subjective:   Joe Patterson is a 47 y.o. male, presents to clinic for routine f/up   Mood is great, job going well, wants to get off effexor  Psychiatrist last OV was 7 months  He saw him about 6 x    04/04/2023    1:35 PM 10/31/2022    8:43 AM 07/28/2022    2:03 PM  Depression screen PHQ 2/9  Decreased Interest 0 0 0  Down, Depressed, Hopeless 0 0 0  PHQ - 2 Score 0 0 0  Altered sleeping 0 0 0  Tired, decreased energy 0 0 0  Change in appetite 0 0 0  Feeling bad or failure about yourself  0 0 0  Trouble concentrating 0 0 0  Moving slowly or fidgety/restless 0 0 0  Suicidal thoughts 0 0 0  PHQ-9 Score 0 0 0  Difficult doing work/chores Not difficult at all Not difficult at all Not difficult at all      04/04/2023    1:35 PM 07/28/2022    2:03 PM 06/29/2022   10:32 AM 06/23/2022   10:49 AM  GAD 7 : Generalized Anxiety Score  Nervous, Anxious, on Edge 0 0 1 3  Control/stop worrying 0 0 0 3  Worry too much - different things 0 0 0 3  Trouble relaxing 0 0 0 0  Restless 0 0 0 0  Easily annoyed or irritable 0 0 0 3  Afraid - awful might happen 0 0 0 3  Total GAD 7 Score 0 0 1 15  Anxiety Difficulty Not difficult at all Not difficult at all Not difficult at all Somewhat difficult   GERD on protonix Severe sx with any missed doses - he did see GI - prior stricture, no sx similar to his past stricture and needing dilation  Working on diet changes when trying to loose weight he eats more spicy food to get away from sweets    Allergies -   Chronic neck pain his neck stretching device still helps more than the meds, celebrex, lyrica  Lab Results  Component Value Date   CHOL 110 05/05/2022   HDL 51 05/05/2022   LDLCALC 44 05/05/2022   TRIG 71  05/05/2022   CHOLHDL 2.2 05/05/2022       Current Outpatient Medications:    atorvastatin (LIPITOR) 20 MG tablet, TAKE 1 TABLET BY MOUTH EVERYDAY AT BEDTIME, Disp: 90 tablet, Rfl: 3   betamethasone dipropionate 0.05 % cream, Apply topically 2 (two) times daily. Use SPARINGLY; can cause stretch marks, Disp: 90 g, Rfl: 2   celecoxib (CELEBREX) 100 MG capsule, TAKE 1 CAPSULE BY MOUTH TWICE A DAY, Disp: 60 capsule, Rfl: 3   cetirizine (ZYRTEC) 10 MG tablet, Take 1 tablet (10 mg total) by mouth daily., Disp: 90 tablet, Rfl: 3   montelukast (SINGULAIR) 10 MG tablet, Take 1 tablet (10 mg total) by mouth at bedtime., Disp: 90 tablet, Rfl: 3   pantoprazole (PROTONIX) 40 MG tablet, Take 1 tablet (40 mg total) by mouth 2 (two) times daily., Disp: 180 tablet, Rfl: 3   pregabalin (LYRICA) 50 MG capsule, , Disp: , Rfl:    pregabalin (LYRICA) 50 MG capsule, Take  50 mg po in the morning, midday and take 100 mg po at bedtime, Disp: 270 capsule, Rfl: 1   venlafaxine XR (EFFEXOR-XR) 75 MG 24 hr capsule, Take 1 capsule (75 mg total) by mouth daily with breakfast., Disp: 90 capsule, Rfl: 3   Turmeric 500 MG TABS, Take 500 mg by mouth daily., Disp: , Rfl:   Patient Active Problem List   Diagnosis Date Noted   Mass of right inguinal region 08/04/2022   Cervical radiculopathy 07/28/2022   Bilateral carpal tunnel syndrome 06/17/2022   Circadian rhythm sleep disorder, shift work type 06/17/2022   Low back pain 06/17/2022   Moderate episode of recurrent major depressive disorder 06/17/2022   Plantar fascia rupture 01/12/2022   Hypertriglyceridemia 11/26/2018   Elevated glucose 11/26/2018   Allergic rhinitis 11/14/2017   Hyperlipidemia 11/14/2017   TMJ (temporomandibular joint syndrome) 06/22/2016   Shoulder pain, bilateral 06/22/2016   Social anxiety disorder 12/29/2015   Allergy to perfume 12/29/2015   History of esophageal stricture 12/29/2015   Eczema, allergic 12/29/2015   Gastroesophageal reflux  12/29/2015    Past Surgical History:  Procedure Laterality Date   APPENDECTOMY  2020   ESOPHAGEAL DILATION     2020   ESOPHAGOGASTRODUODENOSCOPY (EGD) WITH PROPOFOL N/A 02/08/2019   Procedure: ESOPHAGOGASTRODUODENOSCOPY (EGD) WITH PROPOFOL;  Surgeon: Wyline Mood, MD;  Location: [facility] ENDOSCOPY;  Service: Gastroenterology;  Laterality: N/A;   HERNIA REPAIR Right    LAPAROSCOPIC APPENDECTOMY N/A 07/27/2019   Procedure: APPENDECTOMY LAPAROSCOPIC;  Surgeon: Leafy Ro, MD;  Location: [facility] ORS;  Service: General;  Laterality: N/A;   TYMPANOSTOMY TUBE PLACEMENT     as a child    Family History  Problem Relation Age of Onset   Multiple sclerosis Mother    Hearing loss Father    Cancer Maternal Grandmother        lymphoma   Heart disease Maternal Grandfather        early 49s   Lung disease Paternal Grandmother    Blindness Paternal Grandmother    Parkinson's disease Paternal Grandfather    COPD Neg Hx    Diabetes Neg Hx    Hypertension Neg Hx    Stroke Neg Hx    Colon cancer Neg Hx     Social History   Tobacco Use   Smoking status: Former    Packs/day: 1.00    Years: 15.00    Additional pack years: 0.00    Total pack years: 15.00    Types: Cigarettes    Quit date: 12/23/2007    Years since quitting: 15.2   Smokeless tobacco: Former    Types: Chew    Quit date: 2009  Vaping Use   Vaping Use: Never used  Substance Use Topics   Alcohol use: No   Drug use: No     Allergies  Allergen Reactions   Other Nausea Only    Health Maintenance  Topic Date Due   COVID-19 Vaccine (6 - 2023-24 season) 04/13/2023 (Originally 08/12/2022)   INFLUENZA VACCINE  07/13/2023   DTaP/Tdap/Td (8 - Td or Tdap) 12/22/2025   Fecal DNA (Cologuard)  03/17/2026   Hepatitis C Screening  Completed   HIV Screening  Completed   HPV VACCINES  Aged Out    Chart Review Today: I personally reviewed active problem list, medication list, allergies, family history, social history, health  maintenance, notes from last encounter, lab results, imaging with the patient/caregiver today.   Review of Systems  Constitutional: Negative.   HENT: Negative.  Eyes: Negative.   Respiratory: Negative.    Cardiovascular: Negative.   Gastrointestinal: Negative.   Endocrine: Negative.   Genitourinary: Negative.   Musculoskeletal: Negative.   Skin: Negative.   Allergic/Immunologic: Negative.   Neurological: Negative.   Hematological: Negative.   Psychiatric/Behavioral: Negative.    All other systems reviewed and are negative.    Objective:   Vitals:   04/04/23 1334  BP: 130/84  Pulse: 96  Resp: 16  Temp: 98.1 F (36.7 C)  TempSrc: Oral  SpO2: 98%  Weight: 208 lb 14.4 oz (94.8 kg)  Height:  (1.803 m)    Body mass index is 29.14 kg/m.  Physical Exam Vitals and nursing note reviewed.  Constitutional:      General: He is not in acute distress.    Appearance: Normal appearance. He is well-developed. He is not ill-appearing, toxic-appearing or diaphoretic.  HENT:     Head: Normocephalic and atraumatic.     Right Ear: External ear normal.     Left Ear: External ear normal.     Nose: Nose normal.     Mouth/Throat:     Pharynx: No oropharyngeal exudate.  Eyes:     General: No scleral icterus.       Right eye: No discharge.        Left eye: No discharge.     Conjunctiva/sclera: Conjunctivae normal.     Pupils: Pupils are equal, round, and reactive to light.  Neck:     Trachea: No tracheal deviation.  Cardiovascular:     Rate and Rhythm: Normal rate and regular rhythm.     Pulses: Normal pulses.     Heart sounds: Normal heart sounds. No murmur heard.    No friction rub. No gallop.  Pulmonary:     Effort: Pulmonary effort is normal. No respiratory distress.     Breath sounds: Normal breath sounds. No stridor. No wheezing or rales.  Chest:     Chest wall: No tenderness.  Abdominal:     General: Bowel sounds are normal. There is no distension.      Palpations: Abdomen is soft.  Musculoskeletal:     Cervical back: Normal range of motion and neck supple.  Lymphadenopathy:     Cervical: No cervical adenopathy.  Skin:    General: Skin is warm and dry.     Capillary Refill: Capillary refill takes less than 2 seconds.     Coloration: Skin is not pale.     Findings: No rash.  Neurological:     Mental Status: He is alert. Mental status is at baseline.     Motor: No abnormal muscle tone.     Coordination: Coordination normal.  Psychiatric:        Mood and Affect: Mood normal.        Behavior: Behavior normal.         Assessment & Plan:   Problem List Items Addressed This Visit       Respiratory   Allergic rhinitis    Sx stable and well controlled with current meds      Relevant Medications   montelukast (SINGULAIR) 10 MG tablet     Digestive   Gastroesophageal reflux - Primary (Chronic)    Sx controlled with protonix BID, worse sx w/o meds and with certain foods He did consult with GI a few years ago, they said ok for long term PPI BID      Relevant Medications   pantoprazole (PROTONIX) 40 MG tablet  Nervous and Auditory   Cervical radiculopathy    Continued sx but tolerable with his neck stretching/brace, celebrex and lyrica Seeing specialists      Relevant Medications   celecoxib (CELEBREX) 100 MG capsule   venlafaxine XR (EFFEXOR-XR) 75 MG 24 hr capsule   venlafaxine XR (EFFEXOR XR) 37.5 MG 24 hr capsule     Other   Social anxiety disorder (Chronic)    Phq 9 and GAD 7 both negative pt wishes to get off medications- see below the plan for slow dose decrease - he seems to be very sensitive to missed doses, 3 month taper planned      Relevant Medications   venlafaxine XR (EFFEXOR-XR) 75 MG 24 hr capsule   venlafaxine XR (EFFEXOR XR) 37.5 MG 24 hr capsule   History of esophageal stricture    No recurrent sx, he is watching for dysphagia/food obstruction      Relevant Medications   pantoprazole  (PROTONIX) 40 MG tablet   Hyperlipidemia    Last labs well controlled, good med compliance, no SE or concerns Will keep refilling and do next f/up lipids at CPE in Nov Lab Results  Component Value Date   CHOL 110 05/05/2022   HDL 51 05/05/2022   LDLCALC 44 05/05/2022   TRIG 71 05/05/2022   CHOLHDL 2.2 05/05/2022        Relevant Medications   atorvastatin (LIPITOR) 20 MG tablet   Moderate episode of recurrent major depressive disorder    Phq 9 reviewed and neg - see plan above       Relevant Medications   venlafaxine XR (EFFEXOR-XR) 75 MG 24 hr capsule   venlafaxine XR (EFFEXOR XR) 37.5 MG 24 hr capsule     Desire to taper off effexor - discussed at length slow taper off meds over 3 months He is currently having severe withdrawal sx if he even takes his daily dose later in the day or if he forgets - severe GI upset/N/V/D  For a 4 weeks to a month - take 75 mg and 37.5 mg effexor every other day Then try taking 37.5 mg capsule daily for 4 weeks to a month Then try 37.5 mg capsule every other day for several weeks If unable to stop effexor w/o severe withdrawal sx - he can do 37.5 mg dose and skin 2 days or we can discuss adding something with a longer half life (cymbalta) and then weaning off that. He may also want to return to the psychiatrist he established with Overall desires to get off meds  2 month f/up up on the meds taper  CPE in Nov when labs will be due     Return in about 6 months (around 10/04/2023) for Annual Physical after nov 20th .   Danelle Berry, PA-C 04/04/23 2:03 PM

## 2023-04-21 ENCOUNTER — Ambulatory Visit: Payer: BC Managed Care – PPO | Admitting: Family Medicine

## 2023-05-04 ENCOUNTER — Ambulatory Visit: Payer: BC Managed Care – PPO | Admitting: Family Medicine

## 2023-05-12 ENCOUNTER — Telehealth: Payer: BC Managed Care – PPO | Admitting: Family Medicine

## 2023-06-05 ENCOUNTER — Ambulatory Visit: Payer: BC Managed Care – PPO | Admitting: Family Medicine

## 2023-06-12 ENCOUNTER — Other Ambulatory Visit: Payer: Self-pay | Admitting: Family Medicine

## 2023-06-12 DIAGNOSIS — M5412 Radiculopathy, cervical region: Secondary | ICD-10-CM

## 2023-06-29 ENCOUNTER — Ambulatory Visit: Payer: BC Managed Care – PPO | Admitting: Family Medicine

## 2023-06-29 ENCOUNTER — Encounter: Payer: Self-pay | Admitting: Family Medicine

## 2023-06-29 VITALS — BP 108/74 | HR 85 | Temp 97.8°F | Resp 16 | Ht 71.0 in | Wt 210.1 lb

## 2023-06-29 DIAGNOSIS — F4010 Social phobia, unspecified: Secondary | ICD-10-CM | POA: Diagnosis not present

## 2023-06-29 DIAGNOSIS — T887XXA Unspecified adverse effect of drug or medicament, initial encounter: Secondary | ICD-10-CM

## 2023-06-29 MED ORDER — VENLAFAXINE HCL 37.5 MG PO TABS: ORAL_TABLET | ORAL | 0 refills | Status: DC

## 2023-06-29 MED ORDER — DULOXETINE HCL 30 MG PO CPEP: 30.0000 mg | ORAL_CAPSULE | Freq: Every day | ORAL | 3 refills | Status: DC

## 2023-06-29 NOTE — Progress Notes (Signed)
Patient ID: Joe Patterson, male    DOB: 1976-02-06, 47 y.o.   MRN: [id number]  PCP: Danelle Berry, PA-C  Chief Complaint  Patient presents with   Medication Problem    Would like medication change for venlafaxine due to feeling nauseous he tried to decrease but would cause him feel depressed/anxious    Subjective:   Joe Patterson is a 47 y.o. male, presents to clinic with CC of the following:  HPI  Patient was able to decrease his dose but unable to stop the medication completely he would like to add a different medicine to try and cross taper off of meds   Patient Active Problem List   Diagnosis Date Noted   Mass of right inguinal region 08/04/2022   Cervical radiculopathy 07/28/2022   Bilateral carpal tunnel syndrome 06/17/2022   Circadian rhythm sleep disorder, shift work type 06/17/2022   Low back pain 06/17/2022   Moderate episode of recurrent major depressive disorder (HCC) 06/17/2022   Plantar fascia rupture 01/12/2022   Hypertriglyceridemia 11/26/2018   Elevated glucose 11/26/2018   Allergic rhinitis 11/14/2017   Hyperlipidemia 11/14/2017   TMJ (temporomandibular joint syndrome) 06/22/2016   Shoulder pain, bilateral 06/22/2016   Social anxiety disorder 12/29/2015   Allergy to perfume 12/29/2015   History of esophageal stricture 12/29/2015   Eczema, allergic 12/29/2015   Gastroesophageal reflux 12/29/2015      Current Outpatient Medications:    atorvastatin (LIPITOR) 20 MG tablet, TAKE 1 TABLET BY MOUTH EVERYDAY AT BEDTIME, Disp: 90 tablet, Rfl: 3   celecoxib (CELEBREX) 100 MG capsule, Take 1 capsule (100 mg total) by mouth 2 (two) times daily., Disp: 60 capsule, Rfl: 3   montelukast (SINGULAIR) 10 MG tablet, Take 1 tablet (10 mg total) by mouth at bedtime., Disp: 90 tablet, Rfl: 3   pantoprazole (PROTONIX) 40 MG tablet, Take 1 tablet (40 mg total) by mouth 2 (two) times daily., Disp: 180 tablet, Rfl: 3   pregabalin (LYRICA) 50 MG capsule, , Disp: , Rfl:     pregabalin (LYRICA) 50 MG capsule, TAKE 1 CAPSULE BY MOUTH ONCE IN THE MORNING, MIDDAY, AND TAKE 2 CAPSULES AT BEDTME, Disp: 270 capsule, Rfl: 0   Red Yeast Rice Extract (RED YEAST RICE PO), OTC, Disp: , Rfl:    venlafaxine XR (EFFEXOR XR) 37.5 MG 24 hr capsule, Take 37.5 capsule po qam every other day (alternate with 75 mg dose) x 4 weeks, then take 37.5 mg capsule qam daily, Disp: 90 capsule, Rfl: 0   betamethasone dipropionate 0.05 % cream, Apply topically 2 (two) times daily. Use SPARINGLY; can cause stretch marks, Disp: 90 g, Rfl: 2   cetirizine (ZYRTEC) 10 MG tablet, Take 1 tablet (10 mg total) by mouth daily., Disp: 90 tablet, Rfl: 3   Turmeric 500 MG TABS, Take 500 mg by mouth daily., Disp: , Rfl:    venlafaxine XR (EFFEXOR-XR) 75 MG 24 hr capsule, Take 75 mg capsule qam every other day (other days take 37.5 mg dose), Disp: 30 capsule, Rfl: 0   Allergies  Allergen Reactions   Other Nausea Only     Social History   Tobacco Use   Smoking status: Former    Current packs/day: 0.00    Average packs/day: 1 pack/day for 15.0 years (15.0 ttl pk-yrs)    Types: Cigarettes    Start date: 12/22/1992    Quit date: 12/23/2007    Years since quitting: 15.5   Smokeless tobacco: Former    Types: Sports administrator  Quit date: 2009  Vaping Use   Vaping status: Never Used  Substance Use Topics   Alcohol use: No   Drug use: No      Chart Review Today: I personally reviewed active problem list, medication list, allergies, family history, social history, health maintenance, notes from last encounter, lab results, imaging with the patient/caregiver today.   Review of Systems  Constitutional: Negative.   HENT: Negative.    Eyes: Negative.   Respiratory: Negative.    Cardiovascular: Negative.   Gastrointestinal: Negative.   Endocrine: Negative.   Genitourinary: Negative.   Musculoskeletal: Negative.   Skin: Negative.   Allergic/Immunologic: Negative.   Neurological: Negative.    Hematological: Negative.   Psychiatric/Behavioral: Negative.    All other systems reviewed and are negative.      Objective:   Vitals:   06/29/23 0908  BP: 108/74  Pulse: 85  Resp: 16  Temp: 97.8 F (36.6 C)  TempSrc: Oral  SpO2: 96%  Weight: 210 lb 1.6 oz (95.3 kg)  Height: 5\' 11"  (1.803 m)    Body mass index is 29.3 kg/m.  Physical Exam Vitals and nursing note reviewed.  Constitutional:      Appearance: He is well-developed.  HENT:     Head: Normocephalic and atraumatic.     Nose: Nose normal.  Eyes:     General:        Right eye: No discharge.        Left eye: No discharge.     Conjunctiva/sclera: Conjunctivae normal.  Neck:     Trachea: No tracheal deviation.  Cardiovascular:     Rate and Rhythm: Normal rate and regular rhythm.  Pulmonary:     Effort: Pulmonary effort is normal. No respiratory distress.     Breath sounds: No stridor.  Musculoskeletal:        General: Normal range of motion.  Skin:    General: Skin is warm and dry.     Findings: No rash.  Neurological:     Mental Status: He is alert.     Motor: No abnormal muscle tone.     Coordination: Coordination normal.  Psychiatric:        Behavior: Behavior normal.      Results for orders placed or performed in visit on 10/31/22  COMPLETE METABOLIC PANEL WITH GFR  Result Value Ref Range   Glucose, Bld 83 65 - 99 mg/dL   BUN 19 7 - 25 mg/dL   Creat 1.19 1.47 - 8.29 mg/dL   eGFR 562 > OR = 60 ZH/YQM/5.78I6   BUN/Creatinine Ratio SEE NOTE: 6 - 22 (calc)   Sodium 140 135 - 146 mmol/L   Potassium 4.7 3.5 - 5.3 mmol/L   Chloride 102 98 - 110 mmol/L   CO2 31 20 - 32 mmol/L   Calcium 9.9 8.6 - 10.3 mg/dL   Total Protein 7.0 6.1 - 8.1 g/dL   Albumin 5.2 (H) 3.6 - 5.1 g/dL   Globulin 1.8 (L) 1.9 - 3.7 g/dL (calc)   AG Ratio 2.9 (H) 1.0 - 2.5 (calc)   Total Bilirubin 0.4 0.2 - 1.2 mg/dL   Alkaline phosphatase (APISO) 63 36 - 130 U/L   AST 23 10 - 40 U/L   ALT 36 9 - 46 U/L  CBC with  Differential/Platelet  Result Value Ref Range   WBC 6.2 3.8 - 10.8 Thousand/uL   RBC 4.51 4.20 - 5.80 Million/uL   Hemoglobin 14.0 13.2 - 17.1 g/dL   HCT 41.1  38.5 - 50.0 %   MCV 91.1 80.0 - 100.0 fL   MCH 31.0 27.0 - 33.0 pg   MCHC 34.1 32.0 - 36.0 g/dL   RDW 54.0 98.1 - 19.1 %   Platelets 257 140 - 400 Thousand/uL   MPV 10.6 7.5 - 12.5 fL   Neutro Abs 4,594 1,500 - 7,800 cells/uL   Lymphs Abs 1,035 850 - 3,900 cells/uL   Absolute Monocytes 471 200 - 950 cells/uL   Eosinophils Absolute 81 15 - 500 cells/uL   Basophils Absolute 19 0 - 200 cells/uL   Neutrophils Relative % 74.1 %   Total Lymphocyte 16.7 %   Monocytes Relative 7.6 %   Eosinophils Relative 1.3 %   Basophils Relative 0.3 %       Assessment & Plan:   1. Social anxiety disorder He reports anxiety is better and desires to get off meds, PHQ 9 is positive today but a large amount of his reported symptoms may be from the symptoms she is having when trying to discontinue Effexor Plan to try venlafaxine tablet with gradually decreasing dose -he can try and taper off with the tablets or if he continues to have withdrawal symptoms he may want to add Cymbalta daily and keep that on board while tapering off Effexor and later try to wean off of Cymbalta - venlafaxine (EFFEXOR) 37.5 MG tablet; Take 1/2 tablet po BID x 2 weeks, then take 1/2 tablet poqam x 2 weeks  Dispense: 30 tablet; Refill: 0 - DULoxetine (CYMBALTA) 30 MG capsule; Take 1 capsule (30 mg total) by mouth daily.  Dispense: 90 capsule; Refill: 3  2. Medication side effects D/C and cross taper plan noted above - venlafaxine (EFFEXOR) 37.5 MG tablet; Take 1/2 tablet po BID x 2 weeks, then take 1/2 tablet poqam x 2 weeks  Dispense: 30 tablet; Refill: 0 - DULoxetine (CYMBALTA) 30 MG capsule; Take 1 capsule (30 mg total) by mouth daily.  Dispense: 90 capsule; Refill: 3     Danelle Berry, PA-C 06/29/23 9:40 AM

## 2023-07-19 ENCOUNTER — Other Ambulatory Visit: Payer: Self-pay | Admitting: Family Medicine

## 2023-07-19 DIAGNOSIS — F4010 Social phobia, unspecified: Secondary | ICD-10-CM

## 2023-07-19 DIAGNOSIS — T887XXA Unspecified adverse effect of drug or medicament, initial encounter: Secondary | ICD-10-CM

## 2023-07-28 ENCOUNTER — Ambulatory Visit: Payer: BC Managed Care – PPO | Admitting: Family Medicine

## 2023-08-07 ENCOUNTER — Ambulatory Visit: Payer: BC Managed Care – PPO | Admitting: Family Medicine

## 2023-08-18 ENCOUNTER — Ambulatory Visit: Payer: BC Managed Care – PPO | Admitting: Family Medicine

## 2023-08-19 ENCOUNTER — Other Ambulatory Visit: Payer: Self-pay | Admitting: Family Medicine

## 2023-08-19 DIAGNOSIS — M5412 Radiculopathy, cervical region: Secondary | ICD-10-CM

## 2023-08-25 ENCOUNTER — Ambulatory Visit: Payer: BC Managed Care – PPO | Admitting: Internal Medicine

## 2023-08-25 ENCOUNTER — Ambulatory Visit: Payer: BC Managed Care – PPO | Admitting: Family Medicine

## 2023-08-25 ENCOUNTER — Encounter: Payer: Self-pay | Admitting: Internal Medicine

## 2023-08-25 VITALS — BP 132/72 | HR 86 | Resp 18 | Ht 71.0 in | Wt 208.1 lb

## 2023-08-25 DIAGNOSIS — Z23 Encounter for immunization: Secondary | ICD-10-CM

## 2023-08-25 DIAGNOSIS — F331 Major depressive disorder, recurrent, moderate: Secondary | ICD-10-CM | POA: Diagnosis not present

## 2023-08-25 DIAGNOSIS — H9193 Unspecified hearing loss, bilateral: Secondary | ICD-10-CM | POA: Diagnosis not present

## 2023-08-25 MED ORDER — SERTRALINE HCL 25 MG PO TABS: 25.0000 mg | ORAL_TABLET | Freq: Every day | ORAL | 1 refills | Status: DC

## 2023-08-25 NOTE — Patient Instructions (Signed)
It was great seeing you today!  Plan discussed at today's visit: -Start Zoloft 25 mg daily -Start taking Cymbalta every other day for 1 week (4 pills total), then every third day for 1 week (3 pills total), then every fourth day for 1 week (2 pills total), then 1 pill the last week  Follow up in: 6 weeks   Take care and let us know if you have any questions or concerns prior to your next visit.  Dr. Caralee Ates

## 2023-08-25 NOTE — Progress Notes (Signed)
Established Patient Office Visit  Subjective   Patient ID: Joe Patterson, male    DOB: [birth year]  Age: 47 y.o. MRN: [id number]  Chief Complaint  Patient presents with   Depression    Wants to stop cymbalta due to dry mouth and restart zoloft    HPI  Patient is here to discuss depression symptoms and medications. This is the first time I am seeing him.   MDD: -Mood status: stable -Current treatment: Cymbalta 30 mg -Satisfied with current treatment?: no -Duration of current treatment : years -Side effects: yes - dry mouth Medication compliance: excellent compliance Previous psychiatric medications: effexor and zoloft - had been on Effexor but caused nausea and was hard to wean off. Had also been on Zoloft and did well with moods but was having a hard time sleeping at the time, was working third shift at the time as well but no longer. Would like to go back on Zoloft.      08/25/2023    8:12 AM 06/29/2023    9:08 AM 04/04/2023    1:35 PM 10/31/2022    8:43 AM 07/28/2022    2:03 PM  Depression screen PHQ 2/9  Decreased Interest 0 3 0 0 0  Down, Depressed, Hopeless 0 3 0 0 0  PHQ - 2 Score 0 6 0 0 0  Altered sleeping 1 3 0 0 0  Tired, decreased energy 0 3 0 0 0  Change in appetite 0 0 0 0 0  Feeling bad or failure about yourself  0 1 0 0 0  Trouble concentrating 0 3 0 0 0  Moving slowly or fidgety/restless 0 0 0 0 0  Suicidal thoughts 0 0 0 0 0  PHQ-9 Score 1 16 0 0 0  Difficult doing work/chores Not difficult at all Very difficult Not difficult at all Not difficult at all Not difficult at all   Also has been told by family members that he is speaking loudly and they are worried he may need hearing aids, would like hearing screening today.   Patient Active Problem List   Diagnosis Date Noted   Mass of right inguinal region 08/04/2022   Cervical radiculopathy 07/28/2022   Bilateral carpal tunnel syndrome 06/17/2022   Circadian rhythm sleep disorder, shift work type  06/17/2022   Low back pain 06/17/2022   Moderate episode of recurrent major depressive disorder (HCC) 06/17/2022   Plantar fascia rupture 01/12/2022   Hypertriglyceridemia 11/26/2018   Elevated glucose 11/26/2018   Allergic rhinitis 11/14/2017   Hyperlipidemia 11/14/2017   TMJ (temporomandibular joint syndrome) 06/22/2016   Shoulder pain, bilateral 06/22/2016   Social anxiety disorder 12/29/2015   Allergy to perfume 12/29/2015   History of esophageal stricture 12/29/2015   Eczema, allergic 12/29/2015   Gastroesophageal reflux 12/29/2015   Past Medical History:  Diagnosis Date   Anxiety    Appendicitis 07/27/2019   Cyst of skin and subcutaneous tissue 06/22/2016   Eczema    Esophageal stricture    Feeling like committing suicide 06/17/2022   GERD (gastroesophageal reflux disease)    Medication monitoring encounter 11/20/2017   Morbid obesity (HCC) 11/21/2018   BMI >35 with GERD and hyperlipidemia   Non-celiac gluten sensitivity 12/29/2015   Preventative health care 07/05/2017   TMJ (dislocation of temporomandibular joint)    Weight gain 11/14/2017   Past Surgical History:  Procedure Laterality Date   APPENDECTOMY  2020   ESOPHAGEAL DILATION     2020   ESOPHAGOGASTRODUODENOSCOPY (EGD) WITH  PROPOFOL N/A 02/08/2019   Procedure: ESOPHAGOGASTRODUODENOSCOPY (EGD) WITH PROPOFOL;  Surgeon: Wyline Mood, MD;  Location: [facility] ENDOSCOPY;  Service: Gastroenterology;  Laterality: N/A;   HERNIA REPAIR Right    LAPAROSCOPIC APPENDECTOMY N/A 07/27/2019   Procedure: APPENDECTOMY LAPAROSCOPIC;  Surgeon: Leafy Ro, MD;  Location: [facility] ORS;  Service: General;  Laterality: N/A;   TYMPANOSTOMY TUBE PLACEMENT     as a child   Social History   Tobacco Use   Smoking status: Former    Current packs/day: 0.00    Average packs/day: 1 pack/day for 15.0 years (15.0 ttl pk-yrs)    Types: Cigarettes    Start date: 12/22/1992    Quit date: 12/23/2007    Years since quitting: 15.6   Smokeless tobacco:  Former    Types: Chew    Quit date: 2009  Vaping Use   Vaping status: Never Used  Substance Use Topics   Alcohol use: No   Drug use: No   Social History   Socioeconomic History   Marital status: Single    Spouse name: Not on file   Number of children: Not on file   Years of education: 12   Highest education level: Bachelor's degree (e.g., BA, AB, BS)  Occupational History   Occupation: [employer]  Tobacco Use   Smoking status: Former    Current packs/day: 0.00    Average packs/day: 1 pack/day for 15.0 years (15.0 ttl pk-yrs)    Types: Cigarettes    Start date: 12/22/1992    Quit date: 12/23/2007    Years since quitting: 15.6   Smokeless tobacco: Former    Types: Chew    Quit date: 2009  Vaping Use   Vaping status: Never Used  Substance and Sexual Activity   Alcohol use: No   Drug use: No   Sexual activity: Yes  Other Topics Concern   Not on file  Social History Narrative   Not on file   Social Determinants of Health   Financial Resource Strain: Low Risk  (04/03/2023)   Overall Financial Resource Strain (CARDIA)    Difficulty of Paying Living Expenses: Not hard at all  Food Insecurity: No Food Insecurity (04/03/2023)   Hunger Vital Sign    Worried About Running Out of Food in the Last Year: Never true    Ran Out of Food in the Last Year: Never true  Transportation Needs: No Transportation Needs (04/03/2023)   PRAPARE - Administrator, Civil Service (Medical): No    Lack of Transportation (Non-Medical): No  Physical Activity: Insufficiently Active (04/03/2023)   Exercise Vital Sign    Days of Exercise per Week: 1 day    Minutes of Exercise per Session: 30 min  Stress: No Stress Concern Present (04/03/2023)   [employer] of Occupational Health - Occupational Stress Questionnaire    Feeling of Stress : Only a little  Social Connections: Moderately Integrated (04/03/2023)   Social Connection and Isolation Panel [NHANES]    Frequency of Communication  with Friends and Family: More than three times a week    Frequency of Social Gatherings with Friends and Family: Three times a week    Attends Religious Services: More than 4 times per year    Active Member of Clubs or Organizations: Yes    Attends Banker Meetings: More than 4 times per year    Marital Status: Never married  Intimate Partner Violence: Not At Risk (10/31/2022)   Humiliation, Afraid, Rape, and Kick  questionnaire    Fear of Current or Ex-Partner: No    Emotionally Abused: No    Physically Abused: No    Sexually Abused: No   Family Status  Relation Name Status   Mother  Alive   Father  Alive   MGM  Deceased       lymphoma   MGF  Deceased       heart attack   PGM  Deceased   Sister  Alive   PGF  Deceased       Parkinson dieases    Neg Hx  (Not Specified)  No partnership data on file   Family History  Problem Relation Age of Onset   Multiple sclerosis Mother    Hearing loss Father    Cancer Maternal Grandmother        lymphoma   Heart disease Maternal Grandfather        early 67s   Lung disease Paternal Grandmother    Blindness Paternal Grandmother    Parkinson's disease Paternal Grandfather    COPD Neg Hx    Diabetes Neg Hx    Hypertension Neg Hx    Stroke Neg Hx    Colon cancer Neg Hx    Allergies  Allergen Reactions   Other Nausea Only      Review of Systems  Constitutional:  Negative for chills and fever.  Psychiatric/Behavioral:  The patient does not have insomnia.       Objective:     BP 132/72   Pulse 86   Resp 18   Ht 5\' 11"  (1.803 m)   Wt 208 lb 1.6 oz (94.4 kg)   SpO2 97%   BMI 29.02 kg/m  BP Readings from Last 3 Encounters:  08/25/23 132/72  06/29/23 108/74  04/04/23 130/84   Wt Readings from Last 3 Encounters:  08/25/23 208 lb 1.6 oz (94.4 kg)  06/29/23 210 lb 1.6 oz (95.3 kg)  04/04/23 208 lb 14.4 oz (94.8 kg)      Physical Exam Constitutional:      Appearance: Normal appearance.  HENT:     Head:  Normocephalic and atraumatic.  Eyes:     Conjunctiva/sclera: Conjunctivae normal.  Cardiovascular:     Rate and Rhythm: Normal rate and regular rhythm.  Pulmonary:     Effort: Pulmonary effort is normal.     Breath sounds: Normal breath sounds.  Skin:    General: Skin is warm and dry.  Neurological:     General: No focal deficit present.     Mental Status: He is alert. Mental status is at baseline.  Psychiatric:        Mood and Affect: Mood normal.        Behavior: Behavior normal.    Hearing Screening   500Hz  1000Hz  2000Hz  4000Hz   Right ear Pass Pass Pass Pass  Left ear Pass Pass Pass Pass     Last CBC Lab Results  Component Value Date   WBC 6.2 10/31/2022   HGB 14.0 10/31/2022   HCT 41.1 10/31/2022   MCV 91.1 10/31/2022   MCH 31.0 10/31/2022   RDW 11.8 10/31/2022   PLT 257 10/31/2022   Last metabolic panel Lab Results  Component Value Date   GLUCOSE 83 10/31/2022   NA 140 10/31/2022   K 4.7 10/31/2022   CL 102 10/31/2022   CO2 31 10/31/2022   BUN 19 10/31/2022   CREATININE 0.80 10/31/2022   EGFR 111 10/31/2022   CALCIUM 9.9 10/31/2022   PROT  7.0 10/31/2022   ALBUMIN 4.7 07/27/2019   BILITOT 0.4 10/31/2022   ALKPHOS 51 07/27/2019   AST 23 10/31/2022   ALT 36 10/31/2022   ANIONGAP 8 07/27/2019   Last lipids Lab Results  Component Value Date   CHOL 110 05/05/2022   HDL 51 05/05/2022   LDLCALC 44 05/05/2022   TRIG 71 05/05/2022   CHOLHDL 2.2 05/05/2022   Last hemoglobin A1c Lab Results  Component Value Date   HGBA1C 5.2 02/05/2019   Last thyroid functions Lab Results  Component Value Date   TSH 1.74 02/05/2019   Last vitamin D No results found for: "25OHVITD2", "25OHVITD3", "VD25OH" Last vitamin B12 and Folate No results found for: "VITAMINB12", "FOLATE"    The ASCVD Risk score (Arnett DK, et al., 2019) failed to calculate for the following reasons:   The valid total cholesterol range is 130 to 320 mg/dL    Assessment & Plan:   1.  Moderate episode of recurrent major depressive disorder (HCC): Taper plan written out and given to patient to decrease Cymbalta, start Zoloft 25 mg now. Follow up in 6 weeks to recheck.  - sertraline (ZOLOFT) 25 MG tablet; Take 1 tablet (25 mg total) by mouth daily.  Dispense: 30 tablet; Refill: 1  2. Decreased hearing of both ears: Hearing screen normal.  - Hearing screening  3. Need for influenza vaccination: Flu vaccine administered today.   - Flu vaccine trivalent PF, 6mos and older(Flulaval,Afluria,Fluarix,Fluzone)   Return in about 6 weeks (around 10/06/2023).    Margarita Mail, DO

## 2023-09-16 ENCOUNTER — Other Ambulatory Visit: Payer: Self-pay | Admitting: Internal Medicine

## 2023-09-16 DIAGNOSIS — F331 Major depressive disorder, recurrent, moderate: Secondary | ICD-10-CM

## 2023-09-18 ENCOUNTER — Other Ambulatory Visit: Payer: Self-pay | Admitting: Family Medicine

## 2023-09-18 DIAGNOSIS — M5412 Radiculopathy, cervical region: Secondary | ICD-10-CM

## 2023-09-18 NOTE — Telephone Encounter (Signed)
Requested medication (s) are due for refill today - yes   Requested medication (s) are on the active medication list yes  Future visit scheduled yes  Last refill: 08/25/23 #30 1RF  Notes to clinic: Pharmacy request: Requires changes to new start/restart medication- sent for review  Requested Prescriptions  Pending Prescriptions Disp Refills   sertraline (ZOLOFT) 25 MG tablet [Pharmacy Med Name: SERTRALINE HCL 25 MG TABLET] 90 tablet 1    Sig: Take 1 tablet (25 mg total) by mouth daily.     Psychiatry:  Antidepressants - SSRI - sertraline Passed - 09/16/2023 12:32 PM      Passed - AST in normal range and within 360 days    AST  Date Value Ref Range Status  10/31/2022 23 10 - 40 U/L Final         Passed - ALT in normal range and within 360 days    ALT  Date Value Ref Range Status  10/31/2022 36 9 - 46 U/L Final         Passed - Completed PHQ-2 or PHQ-9 in the last 360 days      Passed - Valid encounter within last 6 months    Recent Outpatient Visits           3 weeks ago Moderate episode of recurrent major depressive disorder [facility])   [facility] Margarita Mail, DO   2 months ago Social anxiety disorder   [facility] [place], Sheliah Mends, PA-C   5 months ago Gastroesophageal reflux disease with esophagitis, unspecified whether hemorrhage   [facility] Danelle Berry, PA-C   10 months ago Annual physical exam   [facility] Danelle Berry, PA-C   1 year ago Right inguinal hernia   [facility] Danelle Berry, PA-C       Future Appointments             In 2 weeks Danelle Berry, PA-C [facility], [facility]               Requested Prescriptions  Pending Prescriptions Disp Refills   sertraline (ZOLOFT) 25 MG tablet [Pharmacy Med Name: SERTRALINE HCL 25 MG TABLET] 90 tablet 1    Sig: Take 1 tablet (25 mg total) by mouth  daily.     Psychiatry:  Antidepressants - SSRI - sertraline Passed - 09/16/2023 12:32 PM      Passed - AST in normal range and within 360 days    AST  Date Value Ref Range Status  10/31/2022 23 10 - 40 U/L Final         Passed - ALT in normal range and within 360 days    ALT  Date Value Ref Range Status  10/31/2022 36 9 - 46 U/L Final         Passed - Completed PHQ-2 or PHQ-9 in the last 360 days      Passed - Valid encounter within last 6 months    Recent Outpatient Visits           3 weeks ago Moderate episode of recurrent major depressive disorder [facility])   [facility] Margarita Mail, DO   2 months ago Social anxiety disorder   [facility] [place], Sheliah Mends, PA-C   5 months ago Gastroesophageal reflux disease with esophagitis, unspecified whether hemorrhage   [facility] Danelle Berry, [place]  10 months ago Annual physical exam   [facility] Danelle Berry, [place]   1 year ago Right inguinal hernia   [facility] Danelle Berry, PA-C       Future Appointments             In 2 weeks Danelle Berry, PA-C [facility], [facility]

## 2023-10-06 ENCOUNTER — Encounter: Payer: Self-pay | Admitting: Physician Assistant

## 2023-10-06 ENCOUNTER — Ambulatory Visit: Payer: BC Managed Care – PPO | Admitting: Physician Assistant

## 2023-10-06 ENCOUNTER — Other Ambulatory Visit: Payer: Self-pay | Admitting: Family Medicine

## 2023-10-06 VITALS — BP 134/82 | HR 87 | Temp 98.0°F | Resp 16 | Ht 71.0 in | Wt 212.8 lb

## 2023-10-06 DIAGNOSIS — F331 Major depressive disorder, recurrent, moderate: Secondary | ICD-10-CM | POA: Diagnosis not present

## 2023-10-06 DIAGNOSIS — F4010 Social phobia, unspecified: Secondary | ICD-10-CM

## 2023-10-06 DIAGNOSIS — M5412 Radiculopathy, cervical region: Secondary | ICD-10-CM | POA: Diagnosis not present

## 2023-10-06 MED ORDER — SERTRALINE HCL 25 MG PO TABS: 25.0000 mg | ORAL_TABLET | Freq: Every day | ORAL | 1 refills | Status: DC

## 2023-10-06 MED ORDER — PREGABALIN 50 MG PO CAPS: 50.0000 mg | ORAL_CAPSULE | Freq: Every day | ORAL | 0 refills | Status: DC

## 2023-10-06 NOTE — Progress Notes (Signed)
Attempting to refill pt's lyrica x 3 months  Error with controlled substance prescribing today for 2 providers in office Will try again later today  Danelle Berry, PA-C

## 2023-10-06 NOTE — Assessment & Plan Note (Signed)
Chronic, historic condition He reports sertraline 25 mg p.o. daily appears to be adequately controlling his social anxiety.  He denies current depressive symptoms. PHQ-9 and GAD-7 were reviewed today with him Continue current regimen Refills provided today Follow up in 6 months or sooner if concerns arise

## 2023-10-06 NOTE — Progress Notes (Signed)
Established Patient Office Visit  Name: Joe Patterson   MRN: [id number]    DOB: 12/21/1975   Date:10/06/2023  Today's Provider: Jacquelin Hawking, MHS, PA-C Introduced myself to the patient as a PA-C and provided education on APPs in clinical practice.         Subjective  Chief Complaint  Chief Complaint  Patient presents with   Depression    Pt states has been doing well on zoloft    HPI  DEPRESSION He reports mood is great and feels like he is doing well on current dose Reports his main concern was social anxiety and thinks the current dose of Zoloft is good for managing this  Reports marked improvement in social anxiety at this time   Mood status: improved, stable  Satisfied with current treatment?: yes Symptom severity: mild  Duration of current treatment : months Side effects: no Medication compliance: good compliance Psychotherapy/counseling: no in the past he has seen therapy services but is not currently  Previous psychiatric medications: Cymbalta, Effexor  Currently taking Zoloft 25 mg PO every day  Depressed mood: no Anxious mood: no Anhedonia: no Significant weight loss or gain: no Insomnia: no  Fatigue: no Feelings of worthlessness or guilt: no Impaired concentration/indecisiveness: no Suicidal ideations: no Hopelessness: no Crying spells: no    10/06/2023    9:52 AM 08/25/2023    8:12 AM 06/29/2023    9:08 AM 04/04/2023    1:35 PM 10/31/2022    8:43 AM  Depression screen PHQ 2/9  Decreased Interest 0 0 3 0 0  Down, Depressed, Hopeless 0 0 3 0 0  PHQ - 2 Score 0 0 6 0 0  Altered sleeping 0 1 3 0 0  Tired, decreased energy 0 0 3 0 0  Change in appetite 0 0 0 0 0  Feeling bad or failure about yourself  0 0 1 0 0  Trouble concentrating 0 0 3 0 0  Moving slowly or fidgety/restless 0 0 0 0 0  Suicidal thoughts 0 0 0 0 0  PHQ-9 Score 0 1 16 0 0  Difficult doing work/chores Not difficult at all Not difficult at all Very difficult Not difficult  at all Not difficult at all      10/06/2023    9:52 AM 08/25/2023    8:19 AM 06/29/2023    9:09 AM 04/04/2023    1:35 PM  GAD 7 : Generalized Anxiety Score  Nervous, Anxious, on Edge 0 0 1 0  Control/stop worrying 0 0 1 0  Worry too much - different things 0 0 1 0  Trouble relaxing 0 0 1 0  Restless 0 0 1 0  Easily annoyed or irritable 0 1 1 0  Afraid - awful might happen 0 0 1 0  Total GAD 7 Score 0 1 7 0  Anxiety Difficulty Not difficult at all Not difficult at all Somewhat difficult Not difficult at all    He also reports concerns for neck pain  States he has been seen by [facility] for this, Dr Hal Hope  We are managing his Lyrica script - he would like to increase the Lyrica to 200 mg per day  He has also been doing neck traction at home twice per day but symptoms are getting worse He has apt with them today and it has been over a year since his last apt    Patient Active Problem List   Diagnosis Date  Noted   Mass of right inguinal region 08/04/2022   Cervical radiculopathy 07/28/2022   Bilateral carpal tunnel syndrome 06/17/2022   Circadian rhythm sleep disorder, shift work type 06/17/2022   Low back pain 06/17/2022   Moderate episode of recurrent major depressive disorder (HCC) 06/17/2022   Plantar fascia rupture 01/12/2022   Hypertriglyceridemia 11/26/2018   Elevated glucose 11/26/2018   Allergic rhinitis 11/14/2017   Hyperlipidemia 11/14/2017   TMJ (temporomandibular joint syndrome) 06/22/2016   Shoulder pain, bilateral 06/22/2016   Social anxiety disorder 12/29/2015   Allergy to perfume 12/29/2015   History of esophageal stricture 12/29/2015   Eczema, allergic 12/29/2015   Gastroesophageal reflux 12/29/2015    Past Surgical History:  Procedure Laterality Date   APPENDECTOMY  2020   ESOPHAGEAL DILATION     2020   ESOPHAGOGASTRODUODENOSCOPY (EGD) WITH PROPOFOL N/A 02/08/2019   Procedure: ESOPHAGOGASTRODUODENOSCOPY (EGD) WITH PROPOFOL;  Surgeon: Wyline Mood,  MD;  Location: [facility] ENDOSCOPY;  Service: Gastroenterology;  Laterality: N/A;   HERNIA REPAIR Right    LAPAROSCOPIC APPENDECTOMY N/A 07/27/2019   Procedure: APPENDECTOMY LAPAROSCOPIC;  Surgeon: Leafy Ro, MD;  Location: [facility] ORS;  Service: General;  Laterality: N/A;   TYMPANOSTOMY TUBE PLACEMENT     as a child    Family History  Problem Relation Age of Onset   Multiple sclerosis Mother    Hearing loss Father    Cancer Maternal Grandmother        lymphoma   Heart disease Maternal Grandfather        early 71s   Lung disease Paternal Grandmother    Blindness Paternal Grandmother    Parkinson's disease Paternal Grandfather    COPD Neg Hx    Diabetes Neg Hx    Hypertension Neg Hx    Stroke Neg Hx    Colon cancer Neg Hx     Social History   Tobacco Use   Smoking status: Former    Current packs/day: 0.00    Average packs/day: 1 pack/day for 15.0 years (15.0 ttl pk-yrs)    Types: Cigarettes    Start date: 12/22/1992    Quit date: 12/23/2007    Years since quitting: 15.7   Smokeless tobacco: Former    Types: Chew    Quit date: 2009  Substance Use Topics   Alcohol use: No     Current Outpatient Medications:    atorvastatin (LIPITOR) 20 MG tablet, TAKE 1 TABLET BY MOUTH EVERYDAY AT BEDTIME, Disp: 90 tablet, Rfl: 3   betamethasone dipropionate 0.05 % cream, Apply topically 2 (two) times daily. Use SPARINGLY; can cause stretch marks, Disp: 90 g, Rfl: 2   celecoxib (CELEBREX) 100 MG capsule, TAKE 1 CAPSULE BY MOUTH TWICE A DAY, Disp: 60 capsule, Rfl: 3   cetirizine (ZYRTEC) 10 MG tablet, Take 1 tablet (10 mg total) by mouth daily., Disp: 90 tablet, Rfl: 3   montelukast (SINGULAIR) 10 MG tablet, Take 1 tablet (10 mg total) by mouth at bedtime., Disp: 90 tablet, Rfl: 3   pantoprazole (PROTONIX) 40 MG tablet, Take 1 tablet (40 mg total) by mouth 2 (two) times daily., Disp: 180 tablet, Rfl: 3   Red Yeast Rice Extract (RED YEAST RICE PO), OTC, Disp: , Rfl:    Turmeric 500 MG TABS,  Take 500 mg by mouth daily., Disp: , Rfl:    [START ON 10/17/2023] pregabalin (LYRICA) 50 MG capsule, Take 1 capsule (50 mg total) by mouth daily. TAKE 1 CAPSULE BY MOUTH ONCE IN THE MORNING, MIDDAY, AND TAKE  2 CAPSULES AT BEDTME, Disp: 120 capsule, Rfl: 0   sertraline (ZOLOFT) 25 MG tablet, Take 1 tablet (25 mg total) by mouth daily., Disp: 90 tablet, Rfl: 1  Allergies  Allergen Reactions   Other Nausea Only    I personally reviewed active problem list, medication list, allergies, health maintenance, notes from last encounter, lab results with the patient/caregiver today.   ROS    Objective  Vitals:   10/06/23 0947  BP: 134/82  Pulse: 87  Resp: 16  Temp: 98 F (36.7 C)  TempSrc: Oral  SpO2: 98%  Weight: 212 lb 12.8 oz (96.5 kg)  Height: 5\' 11"  (1.803 m)    Body mass index is 29.68 kg/m.  Physical Exam Vitals reviewed.  Constitutional:      General: He is awake.     Appearance: Normal appearance. He is well-developed and well-groomed.  HENT:     Head: Normocephalic and atraumatic.  Cardiovascular:     Pulses:          Radial pulses are 2+ on the right side and 2+ on the left side.  Pulmonary:     Effort: Pulmonary effort is normal.     Breath sounds: No decreased air movement. No decreased breath sounds.  Musculoskeletal:     Cervical back: Normal range of motion.     Right lower leg: No edema.     Left lower leg: No edema.  Skin:    General: Skin is warm and dry.  Neurological:     General: No focal deficit present.     Mental Status: He is alert and oriented to person, place, and time. Mental status is at baseline.     GCS: GCS eye subscore is 4. GCS verbal subscore is 5. GCS motor subscore is 6.     Cranial Nerves: No cranial nerve deficit, dysarthria or facial asymmetry.  Psychiatric:        Attention and Perception: Attention and perception normal.        Mood and Affect: Mood and affect normal.        Speech: Speech normal.        Behavior: Behavior  normal. Behavior is cooperative.        Thought Content: Thought content normal.        Cognition and Memory: Cognition normal.        Judgment: Judgment normal.      No results found for this or any previous visit (from the past 2160 hour(s)).      Fall Risk:    10/06/2023    9:47 AM 08/25/2023    8:12 AM 06/29/2023    9:08 AM 04/04/2023    1:30 PM 10/31/2022    8:43 AM  Fall Risk   Falls in the past year? 0 0 0 0 0  Number falls in past yr: 0 0 0 0 0  Injury with Fall? 0 0 0 0 0  Risk for fall due to : No Fall Risks  No Fall Risks No Fall Risks No Fall Risks  Follow up Falls prevention discussed;Education provided;Falls evaluation completed  Falls prevention discussed;Education provided;Falls evaluation completed Falls prevention discussed;Education provided;Falls evaluation completed Falls prevention discussed;Education provided;Falls evaluation completed      Functional Status Survey: Is the patient deaf or have difficulty hearing?: No Does the patient have difficulty seeing, even when wearing glasses/contacts?: No Does the patient have difficulty concentrating, remembering, or making decisions?: No Does the patient have difficulty walking or climbing stairs?: No  Does the patient have difficulty dressing or bathing?: No Does the patient have difficulty doing errands alone such as visiting a doctor's office or shopping?: No    Assessment & Plan  Problem List Items Addressed This Visit       Nervous and Auditory   Cervical radiculopathy    Chronic, ongoing Patient has appointment later today with [facility] for evaluation and further management He is currently taking Lyrica 50 mg p.o. 3 times daily and is requesting to increase this to 4 times a day or 200 mg total Refill sent in per request with future fill date as he recently had a prescription for this placed on 09/19/2023 which should have been sufficient for 30-day supply with 4 times daily dosing Also provided  referral for orthopedics as he is concerned that insurance might kick back his appointment at Ortho Will continue collaboration with specialty to assist with management He has supply of Celebrex based on medication review with refills available  Follow up as needed for persistent or progressing symptoms        Relevant Medications   sertraline (ZOLOFT) 25 MG tablet   pregabalin (LYRICA) 50 MG capsule (Start on 10/17/2023)   Other Relevant Orders   Ambulatory referral to Orthopedic Surgery     Other   Social anxiety disorder (Chronic)    Chronic, ongoing, improving He reports Sertraline 25 mg PO every day is assisting with symptoms and he is happy with current dose Continue regimen Follow up in 6 months or sooner if concerns arise        Relevant Medications   sertraline (ZOLOFT) 25 MG tablet   Moderate episode of recurrent major depressive disorder (HCC) - Primary    Chronic, historic condition He reports sertraline 25 mg p.o. daily appears to be adequately controlling his social anxiety.  He denies current depressive symptoms. PHQ-9 and GAD-7 were reviewed today with him Continue current regimen Refills provided today Follow up in 6 months or sooner if concerns arise        Relevant Medications   sertraline (ZOLOFT) 25 MG tablet     Return in about 3 months (around 01/06/2024) for Depression.   I, Johnye Kist E Pamlea Finder, PA-C, have reviewed all documentation for this visit. The documentation on 10/06/23 for the exam, diagnosis, procedures, and orders are all accurate and complete.   Jacquelin Hawking, MHS, PA-C [facility]

## 2023-10-06 NOTE — Assessment & Plan Note (Signed)
Chronic, ongoing, improving He reports Sertraline 25 mg PO every day is assisting with symptoms and he is happy with current dose Continue regimen Follow up in 6 months or sooner if concerns arise

## 2023-10-06 NOTE — Assessment & Plan Note (Signed)
Chronic, ongoing Patient has appointment later today with [facility] for evaluation and further management He is currently taking Lyrica 50 mg p.o. 3 times daily and is requesting to increase this to 4 times a day or 200 mg total Refill sent in per request with future fill date as he recently had a prescription for this placed on 09/19/2023 which should have been sufficient for 30-day supply with 4 times daily dosing Also provided referral for orthopedics as he is concerned that insurance might kick back his appointment at Ortho Will continue collaboration with specialty to assist with management He has supply of Celebrex based on medication review with refills available  Follow up as needed for persistent or progressing symptoms

## 2023-10-27 ENCOUNTER — Ambulatory Visit: Payer: BC Managed Care – PPO | Admitting: Physician Assistant

## 2023-10-27 ENCOUNTER — Encounter: Payer: Self-pay | Admitting: Physician Assistant

## 2023-10-27 VITALS — BP 124/84 | HR 107 | Temp 98.3°F | Resp 18 | Ht 71.0 in | Wt 217.8 lb

## 2023-10-27 DIAGNOSIS — M5412 Radiculopathy, cervical region: Secondary | ICD-10-CM | POA: Diagnosis not present

## 2023-10-27 MED ORDER — MELOXICAM 15 MG PO TABS: 15.0000 mg | ORAL_TABLET | Freq: Every day | ORAL | 1 refills | Status: DC

## 2023-10-27 NOTE — Progress Notes (Unsigned)
Established Patient Office Visit  Name: Joe Patterson   MRN: [id number]    DOB: [birth year]   Date:10/30/2023  Today's Provider: Jacquelin Hawking, MHS, PA-C Introduced myself to the patient as a PA-C and provided education on APPs in clinical practice.         Subjective  Chief Complaint  Chief Complaint  Patient presents with   Follow-up   Medication Refill    Wants to discuss Celebrex either uping dose or changing to meloxicam    HPI  He reports he has had an epidural infusion this AM  He states he has been taking Celebrex but this does not seem to helping with inflammation  He is interested in increased Celebrex dose or trying Meloxicam instead  He has been taking increased dose of Celebrex - taking 200 mg PO BID  Thinks he has been having diarrhea and constipation from increased dose   He has not been taking Tylenol due to lack of relief  He is still seeing [facility] for management   Patient Active Problem List   Diagnosis Date Noted   Mass of right inguinal region 08/04/2022   Cervical radiculopathy 07/28/2022   Bilateral carpal tunnel syndrome 06/17/2022   Circadian rhythm sleep disorder, shift work type 06/17/2022   Low back pain 06/17/2022   Moderate episode of recurrent major depressive disorder (HCC) 06/17/2022   Plantar fascia rupture 01/12/2022   Hypertriglyceridemia 11/26/2018   Elevated glucose 11/26/2018   Allergic rhinitis 11/14/2017   Hyperlipidemia 11/14/2017   TMJ (temporomandibular joint syndrome) 06/22/2016   Shoulder pain, bilateral 06/22/2016   Social anxiety disorder 12/29/2015   Allergy to perfume 12/29/2015   History of esophageal stricture 12/29/2015   Eczema, allergic 12/29/2015   Gastroesophageal reflux 12/29/2015    Past Surgical History:  Procedure Laterality Date   APPENDECTOMY  2020   ESOPHAGEAL DILATION     2020   ESOPHAGOGASTRODUODENOSCOPY (EGD) WITH PROPOFOL N/A 02/08/2019   Procedure: ESOPHAGOGASTRODUODENOSCOPY  (EGD) WITH PROPOFOL;  Surgeon: Wyline Mood, MD;  Location: [facility] ENDOSCOPY;  Service: Gastroenterology;  Laterality: N/A;   HERNIA REPAIR Right    LAPAROSCOPIC APPENDECTOMY N/A 07/27/2019   Procedure: APPENDECTOMY LAPAROSCOPIC;  Surgeon: Leafy Ro, MD;  Location: [facility] ORS;  Service: General;  Laterality: N/A;   TYMPANOSTOMY TUBE PLACEMENT     as a child    Family History  Problem Relation Age of Onset   Multiple sclerosis Mother    Hearing loss Father    Cancer Maternal Grandmother        lymphoma   Heart disease Maternal Grandfather        early 62s   Lung disease Paternal Grandmother    Blindness Paternal Grandmother    Parkinson's disease Paternal Grandfather    COPD Neg Hx    Diabetes Neg Hx    Hypertension Neg Hx    Stroke Neg Hx    Colon cancer Neg Hx     Social History   Tobacco Use   Smoking status: Former    Current packs/day: 0.00    Average packs/day: 1 pack/day for 15.0 years (15.0 ttl pk-yrs)    Types: Cigarettes    Start date: 12/22/1992    Quit date: 12/23/2007    Years since quitting: 15.8   Smokeless tobacco: Former    Types: Chew    Quit date: 2009  Substance Use Topics   Alcohol use: No     Current Outpatient Medications:  atorvastatin (LIPITOR) 20 MG tablet, TAKE 1 TABLET BY MOUTH EVERYDAY AT BEDTIME, Disp: 90 tablet, Rfl: 3   betamethasone dipropionate 0.05 % cream, Apply topically 2 (two) times daily. Use SPARINGLY; can cause stretch marks, Disp: 90 g, Rfl: 2   cetirizine (ZYRTEC) 10 MG tablet, Take 1 tablet (10 mg total) by mouth daily., Disp: 90 tablet, Rfl: 3   meloxicam (MOBIC) 15 MG tablet, Take 1 tablet (15 mg total) by mouth daily., Disp: 30 tablet, Rfl: 1   montelukast (SINGULAIR) 10 MG tablet, Take 1 tablet (10 mg total) by mouth at bedtime., Disp: 90 tablet, Rfl: 3   pantoprazole (PROTONIX) 40 MG tablet, Take 1 tablet (40 mg total) by mouth 2 (two) times daily., Disp: 180 tablet, Rfl: 3   pregabalin (LYRICA) 50 MG capsule, Take 1  capsule (50 mg total) by mouth daily. TAKE 1 CAPSULE BY MOUTH ONCE IN THE MORNING, MIDDAY, AND TAKE 2 CAPSULES AT BEDTME, Disp: 120 capsule, Rfl: 0   Red Yeast Rice Extract (RED YEAST RICE PO), OTC, Disp: , Rfl:    sertraline (ZOLOFT) 25 MG tablet, Take 1 tablet (25 mg total) by mouth daily., Disp: 90 tablet, Rfl: 1   Turmeric 500 MG TABS, Take 500 mg by mouth daily., Disp: , Rfl:   Allergies  Allergen Reactions   Other Nausea Only    I personally reviewed active problem list, medication list, allergies, notes from last encounter, lab results with the patient/caregiver today.   Review of Systems  Cardiovascular:  Negative for chest pain and palpitations.  Gastrointestinal:  Positive for constipation and diarrhea. Negative for abdominal pain, blood in stool, heartburn, nausea and vomiting.  Musculoskeletal:  Positive for neck pain. Negative for falls.      Objective  Vitals:   10/27/23 1345  BP: 124/84  Pulse: (!) 107  Resp: 18  Temp: 98.3 F (36.8 C)  SpO2: 98%  Weight: 217 lb 12.8 oz (98.8 kg)  Height: 5\' 11"  (1.803 m)    Body mass index is 30.38 kg/m.  Physical Exam Vitals reviewed.  Constitutional:      General: He is awake.     Appearance: Normal appearance. He is well-developed and well-groomed.  HENT:     Head: Normocephalic and atraumatic.  Eyes:     General: Lids are normal. Gaze aligned appropriately.     Extraocular Movements: Extraocular movements intact.     Conjunctiva/sclera: Conjunctivae normal.  Pulmonary:     Effort: Pulmonary effort is normal.  Musculoskeletal:     Cervical back: Normal range of motion.  Skin:    General: Skin is warm and dry.  Neurological:     General: No focal deficit present.     Mental Status: He is alert and oriented to person, place, and time.  Psychiatric:        Mood and Affect: Mood normal.        Behavior: Behavior normal. Behavior is cooperative.        Thought Content: Thought content normal.         Judgment: Judgment normal.      No results found for this or any previous visit (from the past 2160 hour(s)).   PHQ2/9:    10/27/2023    1:50 PM 10/06/2023    9:52 AM 08/25/2023    8:12 AM 06/29/2023    9:08 AM 04/04/2023    1:35 PM  Depression screen PHQ 2/9  Decreased Interest 0 0 0 3 0  Down, Depressed, Hopeless 0  0 0 3 0  PHQ - 2 Score 0 0 0 6 0  Altered sleeping 0 0 1 3 0  Tired, decreased energy 0 0 0 3 0  Change in appetite 0 0 0 0 0  Feeling bad or failure about yourself  0 0 0 1 0  Trouble concentrating 0 0 0 3 0  Moving slowly or fidgety/restless 0 0 0 0 0  Suicidal thoughts 0 0 0 0 0  PHQ-9 Score 0 0 1 16 0  Difficult doing work/chores Not difficult at all Not difficult at all Not difficult at all Very difficult Not difficult at all      Fall Risk:    10/27/2023    1:46 PM 10/06/2023    9:47 AM 08/25/2023    8:12 AM 06/29/2023    9:08 AM 04/04/2023    1:30 PM  Fall Risk   Falls in the past year? 0 0 0 0 0  Number falls in past yr: 0 0 0 0 0  Injury with Fall? 0 0 0 0 0  Risk for fall due to :  No Fall Risks  No Fall Risks No Fall Risks  Follow up  Falls prevention discussed;Education provided;Falls evaluation completed  Falls prevention discussed;Education provided;Falls evaluation completed Falls prevention discussed;Education provided;Falls evaluation completed      Functional Status Survey: Is the patient deaf or have difficulty hearing?: No Does the patient have difficulty seeing, even when wearing glasses/contacts?: No Does the patient have difficulty concentrating, remembering, or making decisions?: No Does the patient have difficulty walking or climbing stairs?: No Does the patient have difficulty dressing or bathing?: No Does the patient have difficulty doing errands alone such as visiting a doctor's office or shopping?: No    Assessment & Plan  Problem List Items Addressed This Visit       Nervous and Auditory   Cervical radiculopathy -  Primary    Chronic, ongoing Unsure of management at this time considering he reports minimal relief with current Celebrex dosing We reviewed that he is already on max dose of Celebrex and we discussed switching to meloxicam Will send in prescription for meloxicam 15 mg p.o. daily recommend that he stop Celebrex at this time Continue with current Lyrica regimen Continue collaboration with [facility] Follow-up as needed for progressing or persistent symptoms      Relevant Medications   meloxicam (MOBIC) 15 MG tablet     Return in about 4 weeks (around 11/24/2023) for Annual Physical.   I, Brigido Mera E Dalani Mette, PA-C, have reviewed all documentation for this visit. The documentation on 10/30/23 for the exam, diagnosis, procedures, and orders are all accurate and complete.   Jacquelin Hawking, MHS, PA-C [facility]

## 2023-10-30 NOTE — Assessment & Plan Note (Signed)
Chronic, ongoing Unsure of management at this time considering he reports minimal relief with current Celebrex dosing We reviewed that he is already on max dose of Celebrex and we discussed switching to meloxicam Will send in prescription for meloxicam 15 mg p.o. daily recommend that he stop Celebrex at this time Continue with current Lyrica regimen Continue collaboration with [facility] Follow-up as needed for progressing or persistent symptoms

## 2023-11-03 ENCOUNTER — Ambulatory Visit (INDEPENDENT_AMBULATORY_CARE_PROVIDER_SITE_OTHER): Payer: BC Managed Care – PPO | Admitting: Physician Assistant

## 2023-11-03 ENCOUNTER — Ambulatory Visit: Payer: BC Managed Care – PPO | Admitting: Physician Assistant

## 2023-11-03 ENCOUNTER — Encounter: Payer: Self-pay | Admitting: Physician Assistant

## 2023-11-03 VITALS — BP 116/80 | HR 75 | Resp 16 | Ht 71.0 in | Wt 213.4 lb

## 2023-11-03 DIAGNOSIS — Z0000 Encounter for general adult medical examination without abnormal findings: Secondary | ICD-10-CM

## 2023-11-03 DIAGNOSIS — Z7189 Other specified counseling: Secondary | ICD-10-CM | POA: Diagnosis not present

## 2023-11-03 DIAGNOSIS — M5412 Radiculopathy, cervical region: Secondary | ICD-10-CM | POA: Diagnosis not present

## 2023-11-03 MED ORDER — PREGABALIN 50 MG PO CAPS: 100.0000 mg | ORAL_CAPSULE | Freq: Three times a day (TID) | ORAL | 0 refills | Status: DC

## 2023-11-03 NOTE — Assessment & Plan Note (Signed)
A voluntary discussion about advance care planning including the explanation and discussion of advance directives was extensively discussed  with the patient for 3 minutes with patient and myself present.  Explanation about the health care proxy and Living will was reviewed and packet with forms with explanation of how to fill them out was given.  During this discussion, the patient was able to identify a health care proxy as no one currently and plans to fill out the paperwork required.  Patient was offered a separate Advance Care Planning visit for further assistance with forms.

## 2023-11-03 NOTE — Progress Notes (Signed)
Annual Physical Exam   Name: Joe Patterson   MRN: [id number]    DOB: [birth year]   Date:11/03/2023  Today's Provider: Jacquelin Hawking, MHS, PA-C Introduced myself to the patient as a PA-C and provided education on APPs in clinical practice.         Subjective  Chief Complaint  Chief Complaint  Patient presents with   Annual Exam    Pt would like to also discuss Lyrica an increase    HPI  Patient presents for annual CPE .   Diet: Overall normal diet- likes to eat healthier during the week but has some slips on the weekend  Exercise: he is trying to walk a few times per week, pain is limiting factor  Sleep: "my sleep is really good" avg getting 7 hours per night, feels well rested in the AM  Mood: "my mood's great"    Cervical Radiculopathy  He is seeing Dr. Hal Hope with [facility] for procedures He reports pain is migrating to left shoulder, neck and left arm/ hand He states pain has been persistent despite most recent injections last week  He is interested in increasing his lyrica strength to assist with pain  Thinks he has aggravated the area with home traction device   Depression: phq 9 is negative    11/03/2023    8:04 AM 10/27/2023    1:50 PM 10/06/2023    9:52 AM 08/25/2023    8:12 AM 06/29/2023    9:08 AM  Depression screen PHQ 2/9  Decreased Interest 0 0 0 0 3  Down, Depressed, Hopeless 0 0 0 0 3  PHQ - 2 Score 0 0 0 0 6  Altered sleeping 0 0 0 1 3  Tired, decreased energy 0 0 0 0 3  Change in appetite 0 0 0 0 0  Feeling bad or failure about yourself  0 0 0 0 1  Trouble concentrating 0 0 0 0 3  Moving slowly or fidgety/restless 0 0 0 0 0  Suicidal thoughts 0 0 0 0 0  PHQ-9 Score 0 0 0 1 16  Difficult doing work/chores Not difficult at all Not difficult at all Not difficult at all Not difficult at all Very difficult    Hypertension:  BP Readings from Last 3 Encounters:  11/03/23 116/80  10/27/23 124/84  10/06/23 134/82    Obesity: Wt Readings  from Last 3 Encounters:  11/03/23 213 lb 6.4 oz (96.8 kg)  10/27/23 217 lb 12.8 oz (98.8 kg)  10/06/23 212 lb 12.8 oz (96.5 kg)   BMI Readings from Last 3 Encounters:  11/03/23 29.76 kg/m  10/27/23 30.38 kg/m  10/06/23 29.68 kg/m     Lipids:  Lab Results  Component Value Date   CHOL 110 05/05/2022   CHOL 126 02/26/2021   CHOL 119 02/18/2020   Lab Results  Component Value Date   HDL 51 05/05/2022   HDL 44 02/26/2021   HDL 38 (L) 02/18/2020   Lab Results  Component Value Date   LDLCALC 44 05/05/2022   LDLCALC 63 02/26/2021   LDLCALC 63 02/18/2020   Lab Results  Component Value Date   TRIG 71 05/05/2022   TRIG 107 02/26/2021   TRIG 101 02/18/2020   Lab Results  Component Value Date   CHOLHDL 2.2 05/05/2022   CHOLHDL 2.9 02/26/2021   CHOLHDL 3.1 02/18/2020   No results found for: "LDLDIRECT" Glucose:  Glucose, Bld  Date Value Ref Range Status  10/31/2022 83 65 - 99 mg/dL Final    Comment:    .            Fasting reference interval .   05/05/2022 89 65 - 99 mg/dL Final    Comment:    .            Fasting reference interval .   02/26/2021 90 65 - 99 mg/dL Final    Comment:    .            Fasting reference interval .     Flowsheet Row Office Visit from 11/03/2023 in [facility]  AUDIT-C Score 0        Single STD testing and prevention (HIV/chl/gon/syphilis):  no, declines screening today  Skin cancer: Discussed monitoring for atypical lesions Prostate cancer screening:  ordered No results found for: "PSA"   Lung cancer:  Low Dose CT Chest recommended if Age 58-80 years, 30 pack-year currently smoking OR have quit w/in 15years. Patient  not applicable AAA: The USPSTF recommends one-time screening with ultrasonography in men ages 9 to 75 years who have ever smoked. Patient:  not applicable ECG:  NA   Health Maintenance  Topic Date Due   DTaP/Tdap/Td vaccine (8 - Td or Tdap) 12/22/2025   Cologuard (Stool DNA  test)  03/17/2026   Flu Shot  Completed   COVID-19 Vaccine  Completed   Hepatitis C Screening  Completed   HIV Screening  Completed   HPV Vaccine  Aged Out    Advanced Care Planning: A voluntary discussion about advance care planning including the explanation and discussion of advance directives.  Discussed health care proxy and Living will, and the patient was able to identify a health care proxy as no one.  Patient does not have a living will in effect at this time. He has plans to look this up later at home  Patient Active Problem List   Diagnosis Date Noted   Advanced care planning/counseling discussion 11/03/2023   Mass of right inguinal region 08/04/2022   Cervical radiculopathy 07/28/2022   Bilateral carpal tunnel syndrome 06/17/2022   Circadian rhythm sleep disorder, shift work type 06/17/2022   Low back pain 06/17/2022   Moderate episode of recurrent major depressive disorder (HCC) 06/17/2022   Plantar fascia rupture 01/12/2022   Hypertriglyceridemia 11/26/2018   Elevated glucose 11/26/2018   Allergic rhinitis 11/14/2017   Hyperlipidemia 11/14/2017   TMJ (temporomandibular joint syndrome) 06/22/2016   Shoulder pain, bilateral 06/22/2016   Social anxiety disorder 12/29/2015   Allergy to perfume 12/29/2015   History of esophageal stricture 12/29/2015   Eczema, allergic 12/29/2015   Gastroesophageal reflux 12/29/2015    Past Surgical History:  Procedure Laterality Date   APPENDECTOMY  2020   ESOPHAGEAL DILATION     2020   ESOPHAGOGASTRODUODENOSCOPY (EGD) WITH PROPOFOL N/A 02/08/2019   Procedure: ESOPHAGOGASTRODUODENOSCOPY (EGD) WITH PROPOFOL;  Surgeon: Wyline Mood, MD;  Location: [facility] ENDOSCOPY;  Service: Gastroenterology;  Laterality: N/A;   HERNIA REPAIR Right    LAPAROSCOPIC APPENDECTOMY N/A 07/27/2019   Procedure: APPENDECTOMY LAPAROSCOPIC;  Surgeon: Leafy Ro, MD;  Location: [facility] ORS;  Service: General;  Laterality: N/A;   TYMPANOSTOMY TUBE PLACEMENT     as  a child    Family History  Problem Relation Age of Onset   Multiple sclerosis Mother    Hearing loss Father    Cancer Maternal Grandmother        lymphoma   Heart disease Maternal Grandfather  early 65s   Lung disease Paternal Grandmother    Blindness Paternal Grandmother    Parkinson's disease Paternal Grandfather    Cancer Maternal Aunt    COPD Neg Hx    Diabetes Neg Hx    Hypertension Neg Hx    Stroke Neg Hx    Colon cancer Neg Hx     Social History   Socioeconomic History   Marital status: Single    Spouse name: Not on file   Number of children: Not on file   Years of education: 12   Highest education level: Bachelor's degree (e.g., BA, AB, BS)  Occupational History   Occupation: [employer]  Tobacco Use   Smoking status: Former    Current packs/day: 0.00    Average packs/day: 1 pack/day for 15.0 years (15.0 ttl pk-yrs)    Types: Cigarettes    Start date: 12/22/1992    Quit date: 12/23/2007    Years since quitting: 15.8   Smokeless tobacco: Former    Types: Chew    Quit date: 2009  Vaping Use   Vaping status: Never Used  Substance and Sexual Activity   Alcohol use: No   Drug use: No   Sexual activity: Yes  Other Topics Concern   Not on file  Social History Narrative   Not on file   Social Determinants of Health   Financial Resource Strain: Low Risk  (10/02/2023)   Overall Financial Resource Strain (CARDIA)    Difficulty of Paying Living Expenses: Not hard at all  Food Insecurity: No Food Insecurity (10/02/2023)   Hunger Vital Sign    Worried About Running Out of Food in the Last Year: Never true    Ran Out of Food in the Last Year: Never true  Transportation Needs: No Transportation Needs (10/02/2023)   PRAPARE - Administrator, Civil Service (Medical): No    Lack of Transportation (Non-Medical): No  Physical Activity: Insufficiently Active (10/02/2023)   Exercise Vital Sign    Days of Exercise per Week: 1 day    Minutes of  Exercise per Session: 30 min  Stress: No Stress Concern Present (10/02/2023)   [employer] of Occupational Health - Occupational Stress Questionnaire    Feeling of Stress : Not at all  Social Connections: Moderately Integrated (10/02/2023)   Social Connection and Isolation Panel [NHANES]    Frequency of Communication with Friends and Family: More than three times a week    Frequency of Social Gatherings with Friends and Family: Three times a week    Attends Religious Services: More than 4 times per year    Active Member of Clubs or Organizations: Yes    Attends Banker Meetings: More than 4 times per year    Marital Status: Never married  Intimate Partner Violence: Not At Risk (11/03/2023)   Humiliation, Afraid, Rape, and Kick questionnaire    Fear of Current or Ex-Partner: No    Emotionally Abused: No    Physically Abused: No    Sexually Abused: No     Current Outpatient Medications:    atorvastatin (LIPITOR) 20 MG tablet, TAKE 1 TABLET BY MOUTH EVERYDAY AT BEDTIME, Disp: 90 tablet, Rfl: 3   betamethasone dipropionate 0.05 % cream, Apply topically 2 (two) times daily. Use SPARINGLY; can cause stretch marks, Disp: 90 g, Rfl: 2   cetirizine (ZYRTEC) 10 MG tablet, Take 1 tablet (10 mg total) by mouth daily., Disp: 90 tablet, Rfl: 3   meloxicam (MOBIC) 15  MG tablet, Take 1 tablet (15 mg total) by mouth daily., Disp: 30 tablet, Rfl: 1   montelukast (SINGULAIR) 10 MG tablet, Take 1 tablet (10 mg total) by mouth at bedtime., Disp: 90 tablet, Rfl: 3   pantoprazole (PROTONIX) 40 MG tablet, Take 1 tablet (40 mg total) by mouth 2 (two) times daily., Disp: 180 tablet, Rfl: 3   Red Yeast Rice Extract (RED YEAST RICE PO), OTC, Disp: , Rfl:    sertraline (ZOLOFT) 25 MG tablet, Take 1 tablet (25 mg total) by mouth daily., Disp: 90 tablet, Rfl: 1   Turmeric 500 MG TABS, Take 500 mg by mouth daily., Disp: , Rfl:    pregabalin (LYRICA) 50 MG capsule, Take 2 capsules (100 mg total) by  mouth 3 (three) times daily., Disp: 180 capsule, Rfl: 0  Allergies  Allergen Reactions   Other Nausea Only     Review of Systems  Constitutional:  Negative for chills, fever, malaise/fatigue and weight loss.  HENT:  Negative for hearing loss, nosebleeds, sore throat and tinnitus.   Eyes:  Negative for blurred vision, double vision and photophobia.  Respiratory:  Negative for cough, shortness of breath and wheezing.   Cardiovascular:  Negative for chest pain, palpitations and leg swelling.  Gastrointestinal:  Positive for heartburn (hx of silent reflux- managed with meds). Negative for blood in stool, constipation, diarrhea, nausea and vomiting.  Genitourinary:  Negative for dysuria and frequency.  Musculoskeletal:  Positive for neck pain. Negative for falls.  Skin:  Positive for rash (hx of eczema). Negative for itching.  Neurological:  Positive for tingling. Negative for dizziness, tremors, loss of consciousness, weakness and headaches.  Psychiatric/Behavioral:  Negative for depression, memory loss and suicidal ideas. The patient is not nervous/anxious and does not have insomnia.        Objective  Vitals:   11/03/23 0805  BP: 116/80  Pulse: 75  Resp: 16  SpO2: 99%  Weight: 213 lb 6.4 oz (96.8 kg)  Height: 5\' 11"  (1.803 m)    Body mass index is 29.76 kg/m.  Physical Exam Vitals reviewed.  Constitutional:      General: He is awake.     Appearance: Normal appearance. He is well-developed and well-groomed.  HENT:     Head: Normocephalic and atraumatic.     Right Ear: Hearing, tympanic membrane and ear canal normal.     Left Ear: Hearing, tympanic membrane and ear canal normal.     Nose: Nose normal.     Mouth/Throat:     Lips: Pink.     Mouth: Mucous membranes are moist. No lacerations or oral lesions.     Pharynx: Oropharynx is clear. Uvula midline. No pharyngeal swelling, oropharyngeal exudate or posterior oropharyngeal erythema.  Eyes:     General: Lids are  normal. Gaze aligned appropriately.     Extraocular Movements: Extraocular movements intact.     Right eye: Normal extraocular motion and no nystagmus.     Left eye: Normal extraocular motion and no nystagmus.     Conjunctiva/sclera: Conjunctivae normal.     Pupils: Pupils are equal, round, and reactive to light.  Neck:     Thyroid: No thyroid mass, thyromegaly or thyroid tenderness.     Trachea: Phonation normal.  Cardiovascular:     Rate and Rhythm: Normal rate and regular rhythm.     Pulses: Normal pulses.          Radial pulses are 2+ on the right side and 2+ on the left side.  Heart sounds: Normal heart sounds. No murmur heard.    No friction rub. No gallop.  Pulmonary:     Effort: Pulmonary effort is normal.     Breath sounds: Normal breath sounds. No decreased air movement. No decreased breath sounds, wheezing, rhonchi or rales.  Abdominal:     General: Abdomen is flat. Bowel sounds are normal.     Palpations: Abdomen is soft.     Tenderness: There is no abdominal tenderness.  Musculoskeletal:     Cervical back: Normal range of motion and neck supple.     Right lower leg: No edema.     Left lower leg: No edema.  Lymphadenopathy:     Head:     Right side of head: No submental, submandibular or preauricular adenopathy.     Left side of head: No submental, submandibular or preauricular adenopathy.     Cervical: No cervical adenopathy.     Right cervical: No superficial or posterior cervical adenopathy.    Left cervical: No superficial or posterior cervical adenopathy.     Upper Body:     Right upper body: No supraclavicular adenopathy.     Left upper body: No supraclavicular adenopathy.  Skin:    General: Skin is warm and dry.     Capillary Refill: Capillary refill takes less than 2 seconds.  Neurological:     General: No focal deficit present.     Mental Status: He is alert and oriented to person, place, and time. Mental status is at baseline.     GCS: GCS eye  subscore is 4. GCS verbal subscore is 5. GCS motor subscore is 6.     Cranial Nerves: No cranial nerve deficit, dysarthria or facial asymmetry.     Motor: No weakness, tremor, atrophy or abnormal muscle tone.     Gait: Gait is intact.     Deep Tendon Reflexes:     Reflex Scores:      Patellar reflexes are 2+ on the right side and 2+ on the left side. Psychiatric:        Attention and Perception: Attention and perception normal.        Mood and Affect: Mood and affect normal.        Speech: Speech normal.        Behavior: Behavior normal. Behavior is cooperative.        Thought Content: Thought content normal.        Cognition and Memory: Cognition normal.        Judgment: Judgment normal.      No results found for this or any previous visit (from the past 2160 hour(s)).   Fall Risk:    11/03/2023    8:04 AM 10/27/2023    1:46 PM 10/06/2023    9:47 AM 08/25/2023    8:12 AM 06/29/2023    9:08 AM  Fall Risk   Falls in the past year? 0 0 0 0 0  Number falls in past yr: 0 0 0 0 0  Injury with Fall? 0 0 0 0 0  Risk for fall due to : No Fall Risks  No Fall Risks  No Fall Risks  Follow up Falls prevention discussed;Education provided;Falls evaluation completed  Falls prevention discussed;Education provided;Falls evaluation completed  Falls prevention discussed;Education provided;Falls evaluation completed     Functional Status Survey: Is the patient deaf or have difficulty hearing?: No Does the patient have difficulty seeing, even when wearing glasses/contacts?: No Does the patient have difficulty concentrating,  remembering, or making decisions?: No Does the patient have difficulty walking or climbing stairs?: No Does the patient have difficulty dressing or bathing?: No Does the patient have difficulty doing errands alone such as visiting a doctor's office or shopping?: No    Assessment & Plan  Problem List Items Addressed This Visit       Nervous and Auditory   Cervical  radiculopathy    Chronic, ongoing Reports increased pain along left shoulder, neck with radiation to left arm and hand that is not improving despite his most recent epidural with [facility] about a week ago Will request records from Ortho for review He would like to increase Lyrica to 100 mg PO TID to assist with current flare Discussed that this is okay but if he continues to require increased medications and procedures we may need to refer to Pain management  Will increase to Lyrica 100 mg PO TID for one month- he is instructed to send mychart message near the end of script regarding continuing with this regimen or returning to previous one PDMP reviewed, no evidence of worrisome controlled substance use patterns at this time  Follow up in 3 months or sooner if concerns arise        Relevant Medications   pregabalin (LYRICA) 50 MG capsule     Other   Advanced care planning/counseling discussion    A voluntary discussion about advance care planning including the explanation and discussion of advance directives was extensively discussed  with the patient for 3  minutes with patient and myself  present.  Explanation about the health care proxy and Living will was reviewed and packet with forms with explanation of how to fill them out was given.  During this discussion, the patient was able to identify a health care proxy as  no one currently  and plans  to fill out the paperwork required.  Patient was offered a separate Advance Care Planning visit for further assistance with forms.         Other Visit Diagnoses     Annual physical exam    -  Primary   Relevant Orders   TSH   Hemoglobin A1c   Lipid panel   CBC with Differential/Platelet   COMPLETE METABOLIC PANEL WITH GFR   PSA       -Prostate cancer screening and PSA options (with potential risks and benefits of testing vs not testing) were discussed along with recent recs/guidelines. -USPSTF grade A and B recommendations  reviewed with patient; age-appropriate recommendations, preventive care, screening tests, etc discussed and encouraged; healthy living encouraged; see AVS for patient education given to patient -Discussed importance of 150 minutes of physical activity weekly, eat two servings of fish weekly, eat one serving of tree nuts ( cashews, pistachios, pecans, almonds.Marland Kitchen) every other day, eat 6 servings of fruit/vegetables daily and drink plenty of water and avoid sweet beverages.  -Reviewed Health Maintenance: yes  Return in about 3 months (around 02/03/2024) for cervical radiculopathy .   I, Kailie Polus E Esly Selvage, PA-C, have reviewed all documentation for this visit. The documentation on 11/03/23 for the exam, diagnosis, procedures, and orders are all accurate and complete.   Jacquelin Hawking, MHS, PA-C [facility]

## 2023-11-03 NOTE — Assessment & Plan Note (Addendum)
Chronic, ongoing Reports increased pain along left shoulder, neck with radiation to left arm and hand that is not improving despite his most recent epidural with [facility] about a week ago Will request records from Ortho for review He would like to increase Lyrica to 100 mg PO TID to assist with current flare Discussed that this is okay but if he continues to require increased medications and procedures we may need to refer to Pain management  Will increase to Lyrica 100 mg PO TID for one month- he is instructed to send mychart message near the end of script regarding continuing with this regimen or returning to previous one PDMP reviewed, no evidence of worrisome controlled substance use patterns at this time  Follow up in 3 months or sooner if concerns arise

## 2023-11-04 LAB — CBC WITH DIFFERENTIAL/PLATELET
Absolute Lymphocytes: 1048 {cells}/uL (ref 850–3900)
Absolute Monocytes: 515 {cells}/uL (ref 200–950)
Basophils Absolute: 31 {cells}/uL (ref 0–200)
Basophils Relative: 0.5 %
Eosinophils Absolute: 50 {cells}/uL (ref 15–500)
Eosinophils Relative: 0.8 %
HCT: 42.4 % (ref 38.5–50.0)
Hemoglobin: 14 g/dL (ref 13.2–17.1)
MCH: 29.9 pg (ref 27.0–33.0)
MCHC: 33 g/dL (ref 32.0–36.0)
MCV: 90.6 fL (ref 80.0–100.0)
MPV: 10.2 fL (ref 7.5–12.5)
Monocytes Relative: 8.3 %
Neutro Abs: 4557 {cells}/uL (ref 1500–7800)
Neutrophils Relative %: 73.5 %
Platelets: 293 10*3/uL (ref 140–400)
RBC: 4.68 10*6/uL (ref 4.20–5.80)
RDW: 11.8 % (ref 11.0–15.0)
Total Lymphocyte: 16.9 %
WBC: 6.2 10*3/uL (ref 3.8–10.8)

## 2023-11-04 LAB — LIPID PANEL
Cholesterol: 128 mg/dL (ref <200)
HDL: 42 mg/dL (ref >=40)
LDL Cholesterol (Calc): 68 mg/dL
Non-HDL Cholesterol (Calc): 86 mg/dL (ref <130)
Total CHOL/HDL Ratio: 3 (calc) (ref <5.0)
Triglycerides: 92 mg/dL (ref <150)

## 2023-11-04 LAB — COMPLETE METABOLIC PANEL WITH GFR
AG Ratio: 2.7 (calc) — ABNORMAL HIGH (ref 1.0–2.5)
ALT: 30 U/L (ref 9–46)
AST: 20 U/L (ref 10–40)
Albumin: 5.1 g/dL (ref 3.6–5.1)
Alkaline phosphatase (APISO): 63 U/L (ref 36–130)
BUN: 13 mg/dL (ref 7–25)
CO2: 29 mmol/L (ref 20–32)
Calcium: 9.9 mg/dL (ref 8.6–10.3)
Chloride: 99 mmol/L (ref 98–110)
Creat: 0.8 mg/dL (ref 0.60–1.29)
Globulin: 1.9 g/dL (ref 1.9–3.7)
Glucose, Bld: 91 mg/dL (ref 65–99)
Potassium: 4.5 mmol/L (ref 3.5–5.3)
Sodium: 136 mmol/L (ref 135–146)
Total Bilirubin: 0.8 mg/dL (ref 0.2–1.2)
Total Protein: 7 g/dL (ref 6.1–8.1)
eGFR: 111 mL/min/{1.73_m2} (ref >=60)

## 2023-11-04 LAB — HEMOGLOBIN A1C
Hgb A1c MFr Bld: 5.4 %{Hb} (ref <5.7)
Mean Plasma Glucose: 108 mg/dL
eAG (mmol/L): 6 mmol/L

## 2023-11-04 LAB — PSA: PSA: 0.37 ng/mL (ref <=4.00)

## 2023-11-04 LAB — TSH: TSH: 1.9 m[IU]/L (ref 0.40–4.50)

## 2023-11-06 NOTE — Progress Notes (Signed)
Your labs are back Your electrolytes, liver and kidney function are overall in normal ranges Your thyroid testing was normal Your A1c was 5.4 which is in normal range Your cholesterol is overall normal and appears to be in good ranges Your CBC is overall normal, no signs of anemia Your prostate testing was normal Please let us know if you have further questions or concerns

## 2023-12-12 ENCOUNTER — Other Ambulatory Visit: Payer: Self-pay | Admitting: Orthopedic Surgery

## 2023-12-12 DIAGNOSIS — M5412 Radiculopathy, cervical region: Secondary | ICD-10-CM

## 2023-12-19 ENCOUNTER — Other Ambulatory Visit: Payer: Self-pay | Admitting: Physician Assistant

## 2023-12-19 DIAGNOSIS — M5412 Radiculopathy, cervical region: Secondary | ICD-10-CM

## 2023-12-21 ENCOUNTER — Encounter: Payer: Self-pay | Admitting: Physician Assistant

## 2023-12-21 ENCOUNTER — Other Ambulatory Visit: Payer: Self-pay | Admitting: Physician Assistant

## 2023-12-21 DIAGNOSIS — M5412 Radiculopathy, cervical region: Secondary | ICD-10-CM

## 2023-12-21 NOTE — Telephone Encounter (Signed)
 Requested Prescriptions  Pending Prescriptions Disp Refills   meloxicam  (MOBIC ) 15 MG tablet [Pharmacy Med Name: MELOXICAM  15 MG TABLET] 30 tablet 1    Sig: TAKE 1 TABLET (15 MG TOTAL) BY MOUTH DAILY.     Analgesics:  COX2 Inhibitors Failed - 12/21/2023  9:59 AM      Failed - Manual Review: Labs are only required if the patient has taken medication for more than 8 weeks.      Passed - HGB in normal range and within 360 days    Hemoglobin  Date Value Ref Range Status  11/03/2023 14.0 13.2 - 17.1 g/dL Final         Passed - Cr in normal range and within 360 days    Creat  Date Value Ref Range Status  11/03/2023 0.80 0.60 - 1.29 mg/dL Final         Passed - HCT in normal range and within 360 days    HCT  Date Value Ref Range Status  11/03/2023 42.4 38.5 - 50.0 % Final         Passed - AST in normal range and within 360 days    AST  Date Value Ref Range Status  11/03/2023 20 10 - 40 U/L Final         Passed - ALT in normal range and within 360 days    ALT  Date Value Ref Range Status  11/03/2023 30 9 - 46 U/L Final         Passed - eGFR is 30 or above and within 360 days    GFR, Est African American  Date Value Ref Range Status  02/26/2021 113 > OR = 60 mL/min/1.85m2 Final   GFR, Est Non African American  Date Value Ref Range Status  02/26/2021 98 > OR = 60 mL/min/1.36m2 Final   eGFR  Date Value Ref Range Status  11/03/2023 111 > OR = 60 mL/min/1.42m2 Final         Passed - Patient is not pregnant      Passed - Valid encounter within last 12 months    Recent Outpatient Visits           1 month ago Annual physical exam   [place] [facility] Mecum, Rocky BRAVO, PA-C   1 month ago Cervical radiculopathy   [place] [facility] Mecum, Erin E, PA-C   2 months ago Moderate episode of recurrent major depressive disorder [facility])   [place] [facility] Mecum, Erin E, PA-C   3 months ago Moderate episode of recurrent  major depressive disorder [facility])   [facility] Bernardo Fend, DO   5 months ago Social anxiety disorder   [facility] Leavy Mole, PA-C       Future Appointments             In 1 week Mecum, Erin E, PA-C [place] [facility], [facility]

## 2023-12-22 MED ORDER — PREGABALIN 50 MG PO CAPS: 100.0000 mg | ORAL_CAPSULE | Freq: Three times a day (TID) | ORAL | 0 refills | Status: DC

## 2024-01-01 ENCOUNTER — Ambulatory Visit: Payer: BC Managed Care – PPO | Admitting: Physician Assistant

## 2024-01-02 ENCOUNTER — Ambulatory Visit: Payer: 59 | Admitting: Physician Assistant

## 2024-01-02 ENCOUNTER — Ambulatory Visit: Payer: BC Managed Care – PPO | Admitting: Physician Assistant

## 2024-01-02 ENCOUNTER — Ambulatory Visit: Payer: BC Managed Care – PPO | Admitting: Family Medicine

## 2024-01-03 ENCOUNTER — Ambulatory Visit: Payer: 59 | Admitting: Family Medicine

## 2024-01-03 ENCOUNTER — Encounter: Payer: Self-pay | Admitting: Family Medicine

## 2024-01-03 VITALS — BP 118/64 | HR 86 | Resp 16 | Ht 71.0 in | Wt 226.0 lb

## 2024-01-03 DIAGNOSIS — K2100 Gastro-esophageal reflux disease with esophagitis, without bleeding: Secondary | ICD-10-CM | POA: Diagnosis not present

## 2024-01-03 DIAGNOSIS — M542 Cervicalgia: Secondary | ICD-10-CM

## 2024-01-03 DIAGNOSIS — M549 Dorsalgia, unspecified: Secondary | ICD-10-CM

## 2024-01-03 DIAGNOSIS — G8929 Other chronic pain: Secondary | ICD-10-CM

## 2024-01-03 DIAGNOSIS — F4010 Social phobia, unspecified: Secondary | ICD-10-CM

## 2024-01-03 DIAGNOSIS — J301 Allergic rhinitis due to pollen: Secondary | ICD-10-CM | POA: Diagnosis not present

## 2024-01-03 DIAGNOSIS — F331 Major depressive disorder, recurrent, moderate: Secondary | ICD-10-CM

## 2024-01-03 DIAGNOSIS — M2550 Pain in unspecified joint: Secondary | ICD-10-CM

## 2024-01-03 DIAGNOSIS — Z8719 Personal history of other diseases of the digestive system: Secondary | ICD-10-CM

## 2024-01-03 DIAGNOSIS — M5412 Radiculopathy, cervical region: Secondary | ICD-10-CM

## 2024-01-03 DIAGNOSIS — E782 Mixed hyperlipidemia: Secondary | ICD-10-CM

## 2024-01-03 MED ORDER — CETIRIZINE HCL 10 MG PO TABS: 10.0000 mg | ORAL_TABLET | Freq: Every day | ORAL | 3 refills | Status: DC

## 2024-01-03 MED ORDER — ATORVASTATIN CALCIUM 20 MG PO TABS: ORAL_TABLET | ORAL | 3 refills | Status: DC

## 2024-01-03 MED ORDER — PREGABALIN 50 MG PO CAPS: 100.0000 mg | ORAL_CAPSULE | Freq: Three times a day (TID) | ORAL | 0 refills | Status: DC

## 2024-01-03 MED ORDER — MELOXICAM 15 MG PO TABS: 15.0000 mg | ORAL_TABLET | Freq: Every day | ORAL | 1 refills | Status: DC

## 2024-01-03 MED ORDER — PANTOPRAZOLE SODIUM 40 MG PO TBEC: 40.0000 mg | DELAYED_RELEASE_TABLET | Freq: Two times a day (BID) | ORAL | 3 refills | Status: DC

## 2024-01-03 MED ORDER — PREGABALIN 100 MG PO CAPS: 100.0000 mg | ORAL_CAPSULE | Freq: Three times a day (TID) | ORAL | 0 refills | Status: DC

## 2024-01-03 NOTE — Patient Instructions (Signed)
Send me a message about a week before you are out of the lyrica so that I can call it in for you, and we will see you in about 6 months

## 2024-01-03 NOTE — Progress Notes (Signed)
Name: Joe Patterson   MRN: [id number]    DOB: August 21, 1976   Date:01/03/2024       Progress Note  Chief Complaint  Patient presents with   Medication Refill     Subjective:   Joe Patterson is a 48 y.o. male, presents to clinic for routine f/up on chronic conditions and med refills  Hyperlipidemia: Currently treated with lipitor 20, pt reports good med compliance Last Lipids: Lab Results  Component Value Date   CHOL 128 11/03/2023   HDL 42 11/03/2023   LDLCALC 68 11/03/2023   TRIG 92 11/03/2023   CHOLHDL 3.0 11/03/2023   - Denies: Chest pain, shortness of breath, myalgias, claudication  MDD/anxiety - feels like he's doing well  Currently on zoloft 25, got off effexor    11/03/2023    8:04 AM 10/27/2023    1:50 PM 10/06/2023    9:52 AM  Depression screen PHQ 2/9  Decreased Interest 0 0 0  Down, Depressed, Hopeless 0 0 0  PHQ - 2 Score 0 0 0  Altered sleeping 0 0 0  Tired, decreased energy 0 0 0  Change in appetite 0 0 0  Feeling bad or failure about yourself  0 0 0  Trouble concentrating 0 0 0  Moving slowly or fidgety/restless 0 0 0  Suicidal thoughts 0 0 0  PHQ-9 Score 0 0 0  Difficult doing work/chores Not difficult at all Not difficult at all Not difficult at all   Chronic pain on lyrica, tumeric, worse upper back and neck pain with radiation to arms, he is going to PT and seeing ortho, yet to do neurosurgery consult He also notes dry eyes and mouth Mother with hx of RA, FMS, similar neck and back pain with multiple surgeries  Needs refills of lyrica 100 mg tablets and mobic  GERD/esophageal stricture sx stable and well controlled on protonix      Current Outpatient Medications:    atorvastatin (LIPITOR) 20 MG tablet, TAKE 1 TABLET BY MOUTH EVERYDAY AT BEDTIME, Disp: 90 tablet, Rfl: 3   cetirizine (ZYRTEC) 10 MG tablet, Take 1 tablet (10 mg total) by mouth daily., Disp: 90 tablet, Rfl: 3   meloxicam (MOBIC) 15 MG tablet, TAKE 1 TABLET (15 MG TOTAL)  BY MOUTH DAILY., Disp: 30 tablet, Rfl: 1   pantoprazole (PROTONIX) 40 MG tablet, Take 1 tablet (40 mg total) by mouth 2 (two) times daily., Disp: 180 tablet, Rfl: 3   pregabalin (LYRICA) 50 MG capsule, Take 2 capsules (100 mg total) by mouth 3 (three) times daily., Disp: 180 capsule, Rfl: 0   sertraline (ZOLOFT) 25 MG tablet, Take 1 tablet (25 mg total) by mouth daily., Disp: 90 tablet, Rfl: 1   Turmeric 500 MG TABS, Take 500 mg by mouth daily., Disp: , Rfl:    betamethasone dipropionate 0.05 % cream, Apply topically 2 (two) times daily. Use SPARINGLY; can cause stretch marks, Disp: 90 g, Rfl: 2   montelukast (SINGULAIR) 10 MG tablet, Take 1 tablet (10 mg total) by mouth at bedtime., Disp: 90 tablet, Rfl: 3   Red Yeast Rice Extract (RED YEAST RICE PO), OTC, Disp: , Rfl:   Patient Active Problem List   Diagnosis Date Noted   Advanced care planning/counseling discussion 11/03/2023   Mass of right inguinal region 08/04/2022   Cervical radiculopathy 07/28/2022   Bilateral carpal tunnel syndrome 06/17/2022   Circadian rhythm sleep disorder, shift work type 06/17/2022   Low back pain 06/17/2022   Moderate  episode of recurrent major depressive disorder (HCC) 06/17/2022   Plantar fascia rupture 01/12/2022   Hypertriglyceridemia 11/26/2018   Elevated glucose 11/26/2018   Allergic rhinitis 11/14/2017   Hyperlipidemia 11/14/2017   TMJ (temporomandibular joint syndrome) 06/22/2016   Shoulder pain, bilateral 06/22/2016   Social anxiety disorder 12/29/2015   Allergy to perfume 12/29/2015   History of esophageal stricture 12/29/2015   Eczema, allergic 12/29/2015   Gastroesophageal reflux 12/29/2015    Past Surgical History:  Procedure Laterality Date   APPENDECTOMY  2020   ESOPHAGEAL DILATION     2020   ESOPHAGOGASTRODUODENOSCOPY (EGD) WITH PROPOFOL N/A 02/08/2019   Procedure: ESOPHAGOGASTRODUODENOSCOPY (EGD) WITH PROPOFOL;  Surgeon: Wyline Mood, MD;  Location: [facility] ENDOSCOPY;  Service:  Gastroenterology;  Laterality: N/A;   HERNIA REPAIR Right    LAPAROSCOPIC APPENDECTOMY N/A 07/27/2019   Procedure: APPENDECTOMY LAPAROSCOPIC;  Surgeon: Leafy Ro, MD;  Location: [facility] ORS;  Service: General;  Laterality: N/A;   TYMPANOSTOMY TUBE PLACEMENT     as a child    Family History  Problem Relation Age of Onset   Multiple sclerosis Mother    Hearing loss Father    Cancer Maternal Grandmother        lymphoma   Heart disease Maternal Grandfather        early 45s   Lung disease Paternal Grandmother    Blindness Paternal Grandmother    Parkinson's disease Paternal Grandfather    Cancer Maternal Aunt    COPD Neg Hx    Diabetes Neg Hx    Hypertension Neg Hx    Stroke Neg Hx    Colon cancer Neg Hx     Social History   Tobacco Use   Smoking status: Former    Current packs/day: 0.00    Average packs/day: 1 pack/day for 15.0 years (15.0 ttl pk-yrs)    Types: Cigarettes    Start date: 12/22/1992    Quit date: 12/23/2007    Years since quitting: 16.0   Smokeless tobacco: Former    Types: Chew    Quit date: 2009  Vaping Use   Vaping status: Never Used  Substance Use Topics   Alcohol use: No   Drug use: No     Allergies  Allergen Reactions   Other Nausea Only    Health Maintenance  Topic Date Due   DTaP/Tdap/Td (8 - Td or Tdap) 12/22/2025   Fecal DNA (Cologuard)  03/17/2026   INFLUENZA VACCINE  Completed   COVID-19 Vaccine  Completed   Hepatitis C Screening  Completed   HIV Screening  Completed   HPV VACCINES  Aged Out    Chart Review Today: I personally reviewed active problem list, medication list, allergies, family history, social history, health maintenance, notes from last encounter, lab results, imaging with the patient/caregiver today.   Review of Systems  Constitutional: Negative.   HENT: Negative.    Eyes: Negative.   Respiratory: Negative.    Cardiovascular: Negative.   Gastrointestinal: Negative.   Endocrine: Negative.   Genitourinary:  Negative.   Musculoskeletal: Negative.   Skin: Negative.   Allergic/Immunologic: Negative.   Neurological: Negative.   Hematological: Negative.   Psychiatric/Behavioral: Negative.    All other systems reviewed and are negative.    Objective:   Vitals:   01/03/24 1356  BP: 118/64  Pulse: 86  Resp: 16  SpO2: 99%  Weight: 226 lb (102.5 kg)  Height: 5\' 11"  (1.803 m)    Body mass index is 31.52 kg/m.  Physical Exam  Vitals and nursing note reviewed.  Constitutional:      General: He is not in acute distress.    Appearance: Normal appearance. He is well-developed. He is not ill-appearing, toxic-appearing or diaphoretic.  HENT:     Head: Normocephalic and atraumatic.     Nose: Nose normal.  Eyes:     General:        Right eye: No discharge.        Left eye: No discharge.     Conjunctiva/sclera: Conjunctivae normal.  Neck:     Trachea: No tracheal deviation.  Cardiovascular:     Rate and Rhythm: Normal rate and regular rhythm.     Pulses: Normal pulses.     Heart sounds: Normal heart sounds.  Pulmonary:     Effort: Pulmonary effort is normal. No respiratory distress.     Breath sounds: Normal breath sounds. No stridor.  Skin:    General: Skin is warm and dry.     Findings: No rash.  Neurological:     Mental Status: He is alert.     Motor: No abnormal muscle tone.     Coordination: Coordination normal.  Psychiatric:        Mood and Affect: Mood normal.        Behavior: Behavior normal.         Assessment & Plan:   1. Seasonal allergic rhinitis due to pollen He is using OTC, he held meds for a while to see if dryness SE improved and there was no difference so he is back on antihistamines, but stopped singulair - cetirizine (ZYRTEC) 10 MG tablet; Take 1 tablet (10 mg total) by mouth daily.  Dispense: 90 tablet; Refill: 3  2. Mixed hyperlipidemia Recent labs reviewed and at goal, tolerating meds - atorvastatin (LIPITOR) 20 MG tablet; TAKE 1 TABLET BY MOUTH  EVERYDAY AT BEDTIME  Dispense: 90 tablet; Refill: 3  3. Gastroesophageal reflux disease with esophagitis, unspecified whether hemorrhage Sx stable and well controlled - pantoprazole (PROTONIX) 40 MG tablet; Take 1 tablet (40 mg total) by mouth 2 (two) times daily.  Dispense: 180 tablet; Refill: 3  4. History of esophageal stricture  - pantoprazole (PROTONIX) 40 MG tablet; Take 1 tablet (40 mg total) by mouth 2 (two) times daily.  Dispense: 180 tablet; Refill: 3  5. Social anxiety disorder (Primary) Feels sx are currently adequately controlled with zoloft, phq 9 and gad 7 reviewed  6. Moderate episode of recurrent major depressive disorder (HCC) See #5, continue zoloft  7. Cervical radiculopathy Currently in flare and working with ortho and PT No prior testing for autoimmune conditions  - meloxicam (MOBIC) 15 MG tablet; Take 1 tablet (15 mg total) by mouth daily.  Dispense: 90 tablet; Refill: 1 - Sjogrens syndrome-A extractable nuclear antibody - Sjogrens syndrome-B extractable nuclear antibody - ANA - C-reactive protein - Rheumatoid factor - Sedimentation rate  8. Polyarthralgia  - Sjogrens syndrome-A extractable nuclear antibody - Sjogrens syndrome-B extractable nuclear antibody - ANA - C-reactive protein - Rheumatoid factor - Sedimentation rate  9. Neck pain, chronic - Sjogrens syndrome-A extractable nuclear antibody - Sjogrens syndrome-B extractable nuclear antibody - ANA - C-reactive protein - Rheumatoid factor - Sedimentation rate  10. Acute upper back pain  - Sjogrens syndrome-A extractable nuclear antibody - Sjogrens syndrome-B extractable nuclear antibody - ANA - C-reactive protein - Rheumatoid factor - Sedimentation rate   Offered neurosurgery referral - he will let me know when he wants the referral/second opinion  Meds refilled Pt  will f/up in 6 months   Return in about 6 months (around 07/02/2024) for Routine follow-up.   Danelle Berry,  PA-C 01/03/24 2:01 PM

## 2024-01-18 ENCOUNTER — Ambulatory Visit: Payer: Self-pay

## 2024-01-18 NOTE — Telephone Encounter (Signed)
  Chief Complaint: Neck pain, asking for medication Symptoms: Above, has PT Frequency: 6 months Pertinent Negatives: Patient denies  Disposition: [] ED /[] Urgent Care (no appt availability in office) / [x] Appointment(In office/virtual)/ []  [facility]/ [] Home Care/ [] Refused Recommended Disposition /[] [facility]/ []  Follow-up with PCP Additional Notes: Agrees with appointment.  Reason for Disposition  [1] SEVERE neck pain (e.g., excruciating, unable to do any normal activities) AND [2] not improved after 2 hours of pain medicine  Answer Assessment - Initial Assessment Questions 1. ONSET: When did the pain begin?      6 months ago 2. LOCATION: Where does it hurt?      Neck 3. PATTERN Does the pain come and go, or has it been constant since it started?      Constant 4. SEVERITY: How bad is the pain?  (Scale 1-10; or mild, moderate, severe)   - NO PAIN (0): no pain or only slight stiffness    - MILD (1-3): doesn't interfere with normal activities    - MODERATE (4-7): interferes with normal activities or awakens from sleep    - SEVERE (8-10):  excruciating pain, unable to do any normal activities      8 5. RADIATION: Does the pain go anywhere else, shoot into your arms?     Shoulder, left arm 6. CORD SYMPTOMS: Any weakness or numbness of the arms or legs?     No 7. CAUSE: What do you think is causing the neck pain?     Nerve 8. NECK OVERUSE: Any recent activities that involved turning or twisting the neck?     No 9. OTHER SYMPTOMS: Do you have any other symptoms? (e.g., headache, fever, chest pain, difficulty breathing, neck swelling)     No 10. PREGNANCY: Is there any chance you are pregnant? When was your last menstrual period?       N/a  Protocols used: Neck Pain or Stiffness-A-AH

## 2024-01-19 ENCOUNTER — Ambulatory Visit: Payer: 59 | Admitting: Family Medicine

## 2024-01-19 ENCOUNTER — Encounter: Payer: Self-pay | Admitting: Family Medicine

## 2024-01-19 VITALS — BP 122/78 | HR 92 | Resp 16 | Ht 71.0 in | Wt 220.0 lb

## 2024-01-19 DIAGNOSIS — R052 Subacute cough: Secondary | ICD-10-CM | POA: Diagnosis not present

## 2024-01-19 DIAGNOSIS — M5412 Radiculopathy, cervical region: Secondary | ICD-10-CM | POA: Diagnosis not present

## 2024-01-19 MED ORDER — PREDNISONE 20 MG PO TABS
40.0000 mg | ORAL_TABLET | Freq: Every day | ORAL | 0 refills | Status: AC
Start: 2024-01-19 — End: 2024-01-24

## 2024-01-19 MED ORDER — BENZONATATE 100 MG PO CAPS: 100.0000 mg | ORAL_CAPSULE | Freq: Three times a day (TID) | ORAL | 1 refills | Status: DC | PRN

## 2024-01-19 MED ORDER — CYCLOBENZAPRINE HCL 10 MG PO TABS: 5.0000 mg | ORAL_TABLET | Freq: Three times a day (TID) | ORAL | 0 refills | Status: DC | PRN

## 2024-01-19 NOTE — Progress Notes (Signed)
 Patient ID: Joe Patterson, male    DOB: 01/08/1976, 48 y.o.   MRN: [id number]  PCP: Leavy Mole, PA-C  Chief Complaint  Patient presents with   Neck Pain    Has a pinched nerve, possible DDD.    Subjective:   Joe Patterson is a 48 y.o. male, presents to clinic with CC of the following:  HPI   Neck pain worse than normal he is still managed with ortho and PT He is here for muscle relaxers -when he does heat therapy orders physical therapy and everything gets looser he has less pain he was hoping the muscle relaxers may also help He does have worse pain with work offices sitting in chairs and this makes his neck pain worse Heat is helpful, worse with cold weather, worse in the evening     Patient Active Problem List   Diagnosis Date Noted   Advanced care planning/counseling discussion 11/03/2023   Mass of right inguinal region 08/04/2022   Cervical radiculopathy 07/28/2022   Bilateral carpal tunnel syndrome 06/17/2022   Circadian rhythm sleep disorder, shift work type 06/17/2022   Low back pain 06/17/2022   Moderate episode of recurrent major depressive disorder (HCC) 06/17/2022   Plantar fascia rupture 01/12/2022   Hypertriglyceridemia 11/26/2018   Elevated glucose 11/26/2018   Allergic rhinitis 11/14/2017   Hyperlipidemia 11/14/2017   TMJ (temporomandibular joint syndrome) 06/22/2016   Shoulder pain, bilateral 06/22/2016   Social anxiety disorder 12/29/2015   Allergy to perfume 12/29/2015   History of esophageal stricture 12/29/2015   Eczema, allergic 12/29/2015   Gastroesophageal reflux 12/29/2015      Current Outpatient Medications:    atorvastatin  (LIPITOR) 20 MG tablet, TAKE 1 TABLET BY MOUTH EVERYDAY AT BEDTIME, Disp: 90 tablet, Rfl: 3   cetirizine  (ZYRTEC ) 10 MG tablet, Take 1 tablet (10 mg total) by mouth daily., Disp: 90 tablet, Rfl: 3   meloxicam  (MOBIC ) 15 MG tablet, Take 1 tablet (15 mg total) by mouth daily., Disp: 90 tablet, Rfl: 1    pantoprazole  (PROTONIX ) 40 MG tablet, Take 1 tablet (40 mg total) by mouth 2 (two) times daily., Disp: 180 tablet, Rfl: 3   [START ON 01/21/2024] pregabalin  (LYRICA ) 100 MG capsule, Take 1 capsule (100 mg total) by mouth 3 (three) times daily., Disp: 270 capsule, Rfl: 0   sertraline  (ZOLOFT ) 25 MG tablet, Take 1 tablet (25 mg total) by mouth daily., Disp: 90 tablet, Rfl: 1   Turmeric 500 MG TABS, Take 500 mg by mouth daily., Disp: , Rfl:    Allergies  Allergen Reactions   Other Nausea Only     Social History   Tobacco Use   Smoking status: Former    Current packs/day: 0.00    Average packs/day: 1 pack/day for 15.0 years (15.0 ttl pk-yrs)    Types: Cigarettes    Start date: 12/22/1992    Quit date: 12/23/2007    Years since quitting: 16.0   Smokeless tobacco: Former    Types: Chew    Quit date: 2009  Vaping Use   Vaping status: Never Used  Substance Use Topics   Alcohol use: No   Drug use: No      Chart Review Today: I personally reviewed active problem list, medication list, allergies, family history, social history, health maintenance, notes from last encounter, lab results, imaging with the patient/caregiver today.   Review of Systems  Constitutional: Negative.   HENT: Negative.    Eyes: Negative.   Respiratory: Negative.  Cardiovascular: Negative.   Gastrointestinal: Negative.   Endocrine: Negative.   Genitourinary: Negative.   Musculoskeletal: Negative.   Skin: Negative.   Allergic/Immunologic: Negative.   Neurological: Negative.   Hematological: Negative.   Psychiatric/Behavioral: Negative.    All other systems reviewed and are negative.      Objective:   Vitals:   01/19/24 1546  BP: 122/78  Pulse: 92  Resp: 16  SpO2: 98%  Weight: 220 lb (99.8 kg)  Height: 5' 11 (1.803 m)    Body mass index is 30.68 kg/m.  Physical Exam Vitals and nursing note reviewed.  Constitutional:      Appearance: He is well-developed.  HENT:     Head: Normocephalic  and atraumatic.     Nose: Nose normal.  Eyes:     General:        Right eye: No discharge.        Left eye: No discharge.     Conjunctiva/sclera: Conjunctivae normal.  Neck:     Trachea: No tracheal deviation.  Cardiovascular:     Rate and Rhythm: Normal rate and regular rhythm.  Pulmonary:     Effort: Pulmonary effort is normal. No respiratory distress.     Breath sounds: No stridor.  Musculoskeletal:        General: Normal range of motion.  Skin:    General: Skin is warm and dry.     Findings: No rash.  Neurological:     Mental Status: He is alert.     Motor: No abnormal muscle tone.     Coordination: Coordination normal.  Psychiatric:        Behavior: Behavior normal.      Results for orders placed or performed in visit on 11/03/23  TSH   Collection Time: 11/03/23  8:59 AM  Result Value Ref Range   TSH 1.90 0.40 - 4.50 mIU/L  Hemoglobin A1c   Collection Time: 11/03/23  8:59 AM  Result Value Ref Range   Hgb A1c MFr Bld 5.4 <5.7 % of total Hgb   Mean Plasma Glucose 108 mg/dL   eAG (mmol/L) 6.0 mmol/L  Lipid panel   Collection Time: 11/03/23  8:59 AM  Result Value Ref Range   Cholesterol 128 <200 mg/dL   HDL 42 > OR = 40 mg/dL   Triglycerides 92 <849 mg/dL   LDL Cholesterol (Calc) 68 mg/dL (calc)   Total CHOL/HDL Ratio 3.0 <5.0 (calc)   Non-HDL Cholesterol (Calc) 86 <869 mg/dL (calc)  CBC with Differential/Platelet   Collection Time: 11/03/23  8:59 AM  Result Value Ref Range   WBC 6.2 3.8 - 10.8 Thousand/uL   RBC 4.68 4.20 - 5.80 Million/uL   Hemoglobin 14.0 13.2 - 17.1 g/dL   HCT 57.5 61.4 - 49.9 %   MCV 90.6 80.0 - 100.0 fL   MCH 29.9 27.0 - 33.0 pg   MCHC 33.0 32.0 - 36.0 g/dL   RDW 88.1 88.9 - 84.9 %   Platelets 293 140 - 400 Thousand/uL   MPV 10.2 7.5 - 12.5 fL   Neutro Abs 4,557 1,500 - 7,800 cells/uL   Absolute Lymphocytes 1,048 850 - 3,900 cells/uL   Absolute Monocytes 515 200 - 950 cells/uL   Eosinophils Absolute 50 15 - 500 cells/uL    Basophils Absolute 31 0 - 200 cells/uL   Neutrophils Relative % 73.5 %   Total Lymphocyte 16.9 %   Monocytes Relative 8.3 %   Eosinophils Relative 0.8 %   Basophils Relative 0.5 %  COMPLETE METABOLIC PANEL WITH GFR   Collection Time: 11/03/23  8:59 AM  Result Value Ref Range   Glucose, Bld 91 65 - 99 mg/dL   BUN 13 7 - 25 mg/dL   Creat 9.19 9.39 - 8.70 mg/dL   eGFR 888 > OR = 60 fO/fpw/8.26f7   BUN/Creatinine Ratio SEE NOTE: 6 - 22 (calc)   Sodium 136 135 - 146 mmol/L   Potassium 4.5 3.5 - 5.3 mmol/L   Chloride 99 98 - 110 mmol/L   CO2 29 20 - 32 mmol/L   Calcium  9.9 8.6 - 10.3 mg/dL   Total Protein 7.0 6.1 - 8.1 g/dL   Albumin 5.1 3.6 - 5.1 g/dL   Globulin 1.9 1.9 - 3.7 g/dL (calc)   AG Ratio 2.7 (H) 1.0 - 2.5 (calc)   Total Bilirubin 0.8 0.2 - 1.2 mg/dL   Alkaline phosphatase (APISO) 63 36 - 130 U/L   AST 20 10 - 40 U/L   ALT 30 9 - 46 U/L  PSA   Collection Time: 11/03/23  8:59 AM  Result Value Ref Range   PSA 0.37 < OR = 4.00 ng/mL       Assessment & Plan:   1. Cervical radiculopathy (Primary) Known chronic neck pain with radiculopathy currently being managed by Ortho and physical therapy He does have increase of pain with shooting down his arm and numbness and tingling encouraged him to try a round of oral steroids he reports that in the past steroid injections have not helped him -the burst of prednisone  was still offered He is here today to ask for muscle relaxers hoping this may also help his pain which has gotten worse in the last 2 days due to working in different locations and having to sit and more uncomfortable chairs Did send in Flexeril  for him which she has taken and tolerated in the past He continues to decline a referral to neurosurgeon for second opinion he has been recommended to have surgery - predniSONE  (DELTASONE ) 20 MG tablet; Take 2 tablets (40 mg total) by mouth daily with breakfast for 5 days.  Dispense: 10 tablet; Refill: 0 - cyclobenzaprine   (FLEXERIL ) 10 MG tablet; Take 0.5-1 tablets (5-10 mg total) by mouth 3 (three) times daily as needed for muscle spasms.  Dispense: 30 tablet; Refill: 0  2. 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  Dispense: 60 capsule; Refill: 1      Michelene Cower, PA-C 01/19/24 4:07 PM

## 2024-01-21 LAB — SJOGRENS SYNDROME-A EXTRACTABLE NUCLEAR ANTIBODY: SSA (Ro) (ENA) Antibody, IgG: <1 AI

## 2024-01-21 LAB — ANTI-NUCLEAR AB-TITER (ANA TITER): ANA Titer 1: 1:80 {titer} — ABNORMAL HIGH

## 2024-01-21 LAB — C-REACTIVE PROTEIN: CRP: <3 mg/L (ref <8.0)

## 2024-01-21 LAB — SJOGRENS SYNDROME-B EXTRACTABLE NUCLEAR ANTIBODY: SSB (La) (ENA) Antibody, IgG: <1 AI

## 2024-01-21 LAB — RHEUMATOID FACTOR: Rheumatoid fact SerPl-aCnc: <10 [IU]/mL (ref <14)

## 2024-01-21 LAB — ANA: Anti Nuclear Antibody (ANA): POSITIVE — AB

## 2024-01-21 LAB — SEDIMENTATION RATE: Sed Rate: 6 mm/h (ref 0–15)

## 2024-01-23 ENCOUNTER — Encounter: Payer: Self-pay | Admitting: Family Medicine

## 2024-02-17 ENCOUNTER — Other Ambulatory Visit: Payer: Self-pay | Admitting: Family Medicine

## 2024-02-17 DIAGNOSIS — M5412 Radiculopathy, cervical region: Secondary | ICD-10-CM

## 2024-04-04 ENCOUNTER — Ambulatory Visit: Admitting: Family Medicine

## 2024-04-14 ENCOUNTER — Other Ambulatory Visit: Payer: Self-pay | Admitting: Family Medicine

## 2024-04-14 DIAGNOSIS — F331 Major depressive disorder, recurrent, moderate: Secondary | ICD-10-CM

## 2024-04-16 ENCOUNTER — Encounter: Payer: Self-pay | Admitting: Family Medicine

## 2024-04-16 DIAGNOSIS — F331 Major depressive disorder, recurrent, moderate: Secondary | ICD-10-CM

## 2024-04-16 MED ORDER — SERTRALINE HCL 25 MG PO TABS: 25.0000 mg | ORAL_TABLET | Freq: Every day | ORAL | 0 refills | Status: DC

## 2024-04-16 NOTE — Telephone Encounter (Signed)
 Refill sent on 04/16/24 in a separate encounter, will refuse this request.  Requested Prescriptions  Pending Prescriptions Disp Refills   sertraline  (ZOLOFT ) 25 MG tablet [Pharmacy Med Name: SERTRALINE  HCL 25 MG TABLET] 90 tablet 1    Sig: TAKE 1 TABLET (25 MG TOTAL) BY MOUTH DAILY.     Psychiatry:  Antidepressants - SSRI - sertraline  Failed - 04/16/2024  1:49 PM      Failed - Valid encounter within last 6 months    Recent Outpatient Visits           2 months ago Cervical radiculopathy   [facility] Adeline Hone, PA-C       Future Appointments             In 2 months Tapia, Leisa, PA-C [facility]            Passed - AST in normal range and within 360 days    AST  Date Value Ref Range Status  11/03/2023 20 10 - 40 U/L Final         Passed - ALT in normal range and within 360 days    ALT  Date Value Ref Range Status  11/03/2023 30 9 - 46 U/L Final         Passed - Completed PHQ-2 or PHQ-9 in the last 360 days

## 2024-05-09 ENCOUNTER — Other Ambulatory Visit: Payer: Self-pay | Admitting: Family Medicine

## 2024-05-10 NOTE — Telephone Encounter (Signed)
 Requested medication (s) are due for refill today: Yes  Requested medication (s) are on the active medication list: Yes  Last refill:  01/03/24  Future visit scheduled: Yes  Notes to clinic:  Unable to refill per protocol, cannot delegate.      Requested Prescriptions  Pending Prescriptions Disp Refills   pregabalin  (LYRICA ) 100 MG capsule [Pharmacy Med Name: PREGABALIN  100 MG CAPSULE] 270 capsule 0    Sig: TAKE 1 CAPSULE (100 MG TOTAL) BY MOUTH THREE TIMES DAILY.     Not Delegated - Neurology:  Anticonvulsants - Controlled - pregabalin  Failed - 05/10/2024 10:04 PM      Failed - This refill cannot be delegated      Failed - Valid encounter within last 12 months    Recent Outpatient Visits           3 months ago Cervical radiculopathy   [facility] Adeline Hone, PA-C       Future Appointments             In 1 month Adeline Hone, PA-C [facility]            Passed - Cr in normal range and within 360 days    Creat  Date Value Ref Range Status  11/03/2023 0.80 0.60 - 1.29 mg/dL Final         Passed - Completed PHQ-2 or PHQ-9 in the last 360 days

## 2024-05-18 ENCOUNTER — Other Ambulatory Visit: Payer: Self-pay | Admitting: Family Medicine

## 2024-05-18 DIAGNOSIS — M5412 Radiculopathy, cervical region: Secondary | ICD-10-CM

## 2024-05-20 NOTE — Telephone Encounter (Signed)
 Requested medication (s) are due for refill today: yes  Requested medication (s) are on the active medication list: yes  Last refill:  02/19/24 #90/1  Future visit scheduled: yes  Notes to clinic:  Unable to refill per protocol, cannot delegate.    Requested Prescriptions  Pending Prescriptions Disp Refills   cyclobenzaprine  (FLEXERIL ) 10 MG tablet [Pharmacy Med Name: CYCLOBENZAPRINE  10 MG TABLET] 90 tablet 1    Sig: TAKE 0.5 TO 1 TABLET BY MOUTH 3 TIMES A DAY AS NEEDED FOR MUSCLE SPASM     Not Delegated - Analgesics:  Muscle Relaxants Failed - 05/20/2024  2:59 PM      Failed - This refill cannot be delegated      Failed - Valid encounter within last 6 months    Recent Outpatient Visits           4 months ago Cervical radiculopathy   [facility] Adeline Hone, PA-C

## 2024-05-24 ENCOUNTER — Ambulatory Visit: Admitting: Family Medicine

## 2024-05-24 VITALS — BP 126/84 | HR 79 | Temp 97.9°F | Ht 71.0 in | Wt 203.1 lb

## 2024-05-24 DIAGNOSIS — F331 Major depressive disorder, recurrent, moderate: Secondary | ICD-10-CM | POA: Diagnosis not present

## 2024-05-24 DIAGNOSIS — K2100 Gastro-esophageal reflux disease with esophagitis, without bleeding: Secondary | ICD-10-CM | POA: Diagnosis not present

## 2024-05-24 DIAGNOSIS — G4701 Insomnia due to medical condition: Secondary | ICD-10-CM | POA: Insufficient documentation

## 2024-05-24 DIAGNOSIS — M5412 Radiculopathy, cervical region: Secondary | ICD-10-CM

## 2024-05-24 DIAGNOSIS — E782 Mixed hyperlipidemia: Secondary | ICD-10-CM | POA: Diagnosis not present

## 2024-05-24 DIAGNOSIS — F4010 Social phobia, unspecified: Secondary | ICD-10-CM

## 2024-05-24 MED ORDER — ESZOPICLONE 2 MG PO TABS: 2.0000 mg | ORAL_TABLET | Freq: Every evening | ORAL | 0 refills | Status: DC | PRN

## 2024-05-24 MED ORDER — PREGABALIN 50 MG PO CAPS: 50.0000 mg | ORAL_CAPSULE | Freq: Three times a day (TID) | ORAL | 1 refills | Status: DC

## 2024-05-24 NOTE — Assessment & Plan Note (Signed)
 Doing well, he wishes to wean off zoloft  Reviewed med MOA, duration of action and weaning when on meds for more than 6 months Encouraged him to do 1/2 dose for at least a few weeks before d/c and if SE or withdrawal then he may want to do 1/2 dose every other day and stretch to every 3 days very gradually over weeks to months Phq 9 reviewed

## 2024-05-24 NOTE — Progress Notes (Signed)
 Name: Joe Patterson   MRN: [id number]    DOB: 10/03/1976   Date:05/24/2024       Progress Note  Chief Complaint  Patient presents with   Medical Management of Chronic Issues    Would like to taper off of sertraline  and discuss other medications  Requests rx for lunesta  2mg , 30 day would be good. States he is going on vacation and sometimes has trouble sleeping out of town      Subjective:   Joe Patterson is a 48 y.o. male, presents to clinic for routine f/up on chronic conditions and med refills  Hyperlipidemia: Currently treated with lipitor 20, pt reports good med compliance - labs checked at CPE no med changes Last Lipids: Lab Results  Component Value Date   CHOL 128 11/03/2023   HDL 42 11/03/2023   LDLCALC 68 11/03/2023   TRIG 92 11/03/2023   CHOLHDL 3.0 11/03/2023  - Denies: Chest pain, shortness of breath, myalgias, claudication  MDD/anxiety - feels like he's doing well and would like to taper off med, has been on zoloft  for years, did have severe withdrawal problems with getting off effexor  in the past, discussed weaning off     05/24/2024    2:02 PM 11/03/2023    8:04 AM 10/27/2023    1:50 PM  Depression screen PHQ 2/9  Decreased Interest 0 0 0  Down, Depressed, Hopeless 0 0 0  PHQ - 2 Score 0 0 0  Altered sleeping 0 0 0  Tired, decreased energy 0 0 0  Change in appetite 0 0 0  Feeling bad or failure about yourself  0 0 0  Trouble concentrating 0 0 0  Moving slowly or fidgety/restless 0 0 0  Suicidal thoughts 0 0 0  PHQ-9 Score 0 0 0  Difficult doing work/chores Not difficult at all Not difficult at all Not difficult at all   Chronic pain on lyrica , tumeric, worse upper back and neck pain with radiation to arms, he is going to PT and seeing ortho Still doing conservative management home exercises  He also notes dry eyes and mouth - tried to do lower lyrica  dose and days he's feeling better Mother with hx of RA, FMS, similar neck and back pain with  multiple surgeries  He would like to change lyrica  to 50 mg so on good days he can take less   GERD/esophageal stricture-  sx stable and well controlled on protonix       Current Outpatient Medications:    atorvastatin  (LIPITOR) 20 MG tablet, TAKE 1 TABLET BY MOUTH EVERYDAY AT BEDTIME, Disp: 90 tablet, Rfl: 3   cetirizine  (ZYRTEC ) 10 MG tablet, Take 1 tablet (10 mg total) by mouth daily., Disp: 90 tablet, Rfl: 3   cyclobenzaprine  (FLEXERIL ) 10 MG tablet, TAKE 0.5 TO 1 TABLET BY MOUTH 3 TIMES A DAY AS NEEDED FOR MUSCLE SPASM, Disp: 90 tablet, Rfl: 1   pantoprazole  (PROTONIX ) 40 MG tablet, Take 1 tablet (40 mg total) by mouth 2 (two) times daily., Disp: 180 tablet, Rfl: 3   pregabalin  (LYRICA ) 100 MG capsule, TAKE 1 CAPSULE (100 MG TOTAL) BY MOUTH THREE TIMES DAILY., Disp: 90 capsule, Rfl: 0   sertraline  (ZOLOFT ) 25 MG tablet, TAKE 1 TABLET (25 MG TOTAL) BY MOUTH DAILY., Disp: 90 tablet, Rfl: 1   benzonatate  (TESSALON ) 100 MG capsule, Take 1-2 capsules (100-200 mg total) by mouth 3 (three) times daily as needed for cough. (Patient not taking: Reported on 05/24/2024), Disp: 60 capsule,  Rfl: 1   meloxicam  (MOBIC ) 15 MG tablet, Take 1 tablet (15 mg total) by mouth daily. (Patient not taking: Reported on 05/24/2024), Disp: 90 tablet, Rfl: 1   sertraline  (ZOLOFT ) 25 MG tablet, Take 1 tablet (25 mg total) by mouth daily. (Patient not taking: Reported on 05/24/2024), Disp: 90 tablet, Rfl: 0   Turmeric 500 MG TABS, Take 500 mg by mouth daily. (Patient not taking: Reported on 05/24/2024), Disp: , Rfl:   Patient Active Problem List   Diagnosis Date Noted   Advanced care planning/counseling discussion 11/03/2023   Mass of right inguinal region 08/04/2022   Cervical radiculopathy 07/28/2022   Bilateral carpal tunnel syndrome 06/17/2022   Circadian rhythm sleep disorder, shift work type 06/17/2022   Low back pain 06/17/2022   Moderate episode of recurrent major depressive disorder (HCC) 06/17/2022   Plantar  fascia rupture 01/12/2022   Hypertriglyceridemia 11/26/2018   Elevated glucose 11/26/2018   Allergic rhinitis 11/14/2017   Hyperlipidemia 11/14/2017   TMJ (temporomandibular joint syndrome) 06/22/2016   Shoulder pain, bilateral 06/22/2016   Social anxiety disorder 12/29/2015   Allergy to perfume 12/29/2015   History of esophageal stricture 12/29/2015   Eczema, allergic 12/29/2015   Gastroesophageal reflux 12/29/2015    Past Surgical History:  Procedure Laterality Date   APPENDECTOMY  2020   ESOPHAGEAL DILATION     2020   ESOPHAGOGASTRODUODENOSCOPY (EGD) WITH PROPOFOL  N/A 02/08/2019   Procedure: ESOPHAGOGASTRODUODENOSCOPY (EGD) WITH PROPOFOL ;  Surgeon: Luke Salaam, MD;  Location: [facility] ENDOSCOPY;  Service: Gastroenterology;  Laterality: N/A;   HERNIA REPAIR Right    LAPAROSCOPIC APPENDECTOMY N/A 07/27/2019   Procedure: APPENDECTOMY LAPAROSCOPIC;  Surgeon: Alben Alma, MD;  Location: [facility] ORS;  Service: General;  Laterality: N/A;   TYMPANOSTOMY TUBE PLACEMENT     as a child    Family History  Problem Relation Age of Onset   Multiple sclerosis Mother    Hearing loss Father    Cancer Maternal Grandmother        lymphoma   Heart disease Maternal Grandfather        early 53s   Lung disease Paternal Grandmother    Blindness Paternal Grandmother    Parkinson's disease Paternal Grandfather    Cancer Maternal Aunt    COPD Neg Hx    Diabetes Neg Hx    Hypertension Neg Hx    Stroke Neg Hx    Colon cancer Neg Hx     Social History   Tobacco Use   Smoking status: Former    Current packs/day: 0.00    Average packs/day: 1 pack/day for 15.0 years (15.0 ttl pk-yrs)    Types: Cigarettes    Start date: 12/22/1992    Quit date: 12/23/2007    Years since quitting: 16.4   Smokeless tobacco: Former    Types: Chew    Quit date: 2009  Vaping Use   Vaping status: Never Used  Substance Use Topics   Alcohol use: No   Drug use: No     Allergies  Allergen Reactions   Other  Nausea Only    Health Maintenance  Topic Date Due   INFLUENZA VACCINE  07/12/2024   DTaP/Tdap/Td (8 - Td or Tdap) 12/22/2025   Fecal DNA (Cologuard)  03/17/2026   COVID-19 Vaccine  Completed   Hepatitis C Screening  Completed   HIV Screening  Completed   HPV VACCINES  Aged Out   Meningococcal B Vaccine  Aged Out    Chart Review Today: I personally reviewed  active problem list, medication list, allergies, family history, social history, health maintenance, notes from last encounter, lab results, imaging with the patient/caregiver today.   Review of Systems  Constitutional: Negative.   HENT: Negative.    Eyes: Negative.   Respiratory: Negative.    Cardiovascular: Negative.   Gastrointestinal: Negative.   Endocrine: Negative.   Genitourinary: Negative.   Musculoskeletal: Negative.   Skin: Negative.   Allergic/Immunologic: Negative.   Neurological: Negative.   Hematological: Negative.   Psychiatric/Behavioral: Negative.    All other systems reviewed and are negative.    Objective:   Vitals:   05/24/24 1354  BP: 126/84  Pulse: 79  Temp: 97.9 F (36.6 C)  TempSrc: Oral  SpO2: 95%  Weight: 203 lb 1.6 oz (92.1 kg)  Height: 5' 11 (1.803 m)    Body mass index is 28.33 kg/m.  Physical Exam Vitals and nursing note reviewed.  Constitutional:      General: He is not in acute distress.    Appearance: Normal appearance. He is well-developed. He is not ill-appearing, toxic-appearing or diaphoretic.  HENT:     Head: Normocephalic and atraumatic.     Nose: Nose normal.   Eyes:     General:        Right eye: No discharge.        Left eye: No discharge.     Conjunctiva/sclera: Conjunctivae normal.   Neck:     Trachea: No tracheal deviation.   Cardiovascular:     Rate and Rhythm: Normal rate and regular rhythm.     Pulses: Normal pulses.     Heart sounds: Normal heart sounds.  Pulmonary:     Effort: Pulmonary effort is normal. No respiratory distress.      Breath sounds: Normal breath sounds. No stridor.   Skin:    General: Skin is warm and dry.     Findings: No rash.   Neurological:     Mental Status: He is alert.     Motor: No abnormal muscle tone.     Coordination: Coordination normal.   Psychiatric:        Mood and Affect: Mood normal.        Behavior: Behavior normal.         Assessment & Plan:   Moderate episode of recurrent major depressive disorder (HCC) Assessment & Plan: Doing well, he wishes to wean off zoloft  Reviewed med MOA, duration of action and weaning when on meds for more than 6 months Encouraged him to do 1/2 dose for at least a few weeks before d/c and if SE or withdrawal then he may want to do 1/2 dose every other day and stretch to every 3 days very gradually over weeks to months Phq 9 reviewed    Cervical radiculopathy Assessment & Plan: Refill with different doses on lyrica  Still doing conservative management home exercises, had consulted with PT and ortho, tumeric, muscle relaxers, heat tx  Orders: -     Pregabalin ; Take 1-2 capsules (50-100 mg total) by mouth 3 (three) times daily.  Dispense: 540 capsule; Refill: 1  Mixed hyperlipidemia Assessment & Plan: Last labs well controlled, good med compliance, no SE or concerns Will keep refilling and do next f/up lipids at CPE in Nov Lab Results  Component Value Date   CHOL 128 11/03/2023   HDL 42 11/03/2023   LDLCALC 68 11/03/2023   TRIG 92 11/03/2023   CHOLHDL 3.0 11/03/2023      Gastroesophageal reflux disease with esophagitis, unspecified  whether hemorrhage Assessment & Plan: sx stable and well controlled on protonix     Social anxiety disorder Assessment & Plan: See above MDD - same for anxiety - weaning off meds per pt request   Insomnia due to medical condition Assessment & Plan: Trouble sleeping with anxiety and neck pain when traveling - he asks for lunesta  Rx, has used previously, last Rx was in 2023 and he just ran out PDMP  reviewed and then #30 sent to the pharmacy Encouraged to use PRN and not daily/chronically if able  Orders: -     Eszopiclone ; Take 1 tablet (2 mg total) by mouth at bedtime as needed for sleep. Take immediately before bedtime  Dispense: 30 tablet; Refill: 0   Pt asked to send msg for needed refills prior to his next CPE  Return for CPE Nov/Dec .   Adeline Hone, PA-C 05/24/24 2:05 PM

## 2024-05-24 NOTE — Assessment & Plan Note (Signed)
 Trouble sleeping with anxiety and neck pain when traveling - he asks for lunesta  Rx, has used previously, last Rx was in 2023 and he just ran out PDMP reviewed and then #30 sent to the pharmacy Encouraged to use PRN and not daily/chronically if able

## 2024-05-24 NOTE — Assessment & Plan Note (Signed)
 See above MDD - same for anxiety - weaning off meds per pt request

## 2024-05-24 NOTE — Assessment & Plan Note (Signed)
 sx stable and well controlled on protonix 

## 2024-05-24 NOTE — Assessment & Plan Note (Signed)
 Refill with different doses on lyrica  Still doing conservative management home exercises, had consulted with PT and ortho, tumeric, muscle relaxers, heat tx

## 2024-05-24 NOTE — Assessment & Plan Note (Signed)
 Last labs well controlled, good med compliance, no SE or concerns Will keep refilling and do next f/up lipids at CPE in Nov Lab Results  Component Value Date   CHOL 128 11/03/2023   HDL 42 11/03/2023   LDLCALC 68 11/03/2023   TRIG 92 11/03/2023   CHOLHDL 3.0 11/03/2023

## 2024-06-10 ENCOUNTER — Ambulatory Visit: Admitting: Family Medicine

## 2024-06-28 ENCOUNTER — Ambulatory Visit: Admitting: Family Medicine

## 2024-07-05 ENCOUNTER — Ambulatory Visit: Payer: Self-pay | Admitting: Family Medicine

## 2024-07-26 ENCOUNTER — Encounter: Payer: Self-pay | Admitting: Family Medicine

## 2024-07-26 DIAGNOSIS — M5412 Radiculopathy, cervical region: Secondary | ICD-10-CM

## 2024-07-26 DIAGNOSIS — G8929 Other chronic pain: Secondary | ICD-10-CM

## 2024-07-31 NOTE — Progress Notes (Signed)
 Referring Physician:  Leavy Mole, PA-C [address] [place],  [place] [address]  Primary Physician:  Leavy Mole, PA-C  History of Present Illness: 08/06/2024 [name] has a history of hyperlipidemia, hypertriglyceridemia, depression, GERD, obesity.   2-3 year history of constant neck pain into the shoulder blades along with intermittent numbness/tingling in both arms to his hands (can be left, right, or both). Left arm symptoms > right arm symptoms. He has intermittent pain in the arms as well, but it is worse in his hands. No weakness. Pain is worse when leaning back against a chair and typing.   Previous PT made him worse. No relief with ESIs. He is taking flexeril  and lyrica .   He had injections at [facility].   Tobacco use:  Does not smoke.   Bowel/Bladder Dysfunction: none  Conservative measures:  Physical therapy: [facility] in [place] initial eval 12/22/23 for cervical spine and was discharged to [facility] on 05/03/24.  Multimodal medical therapy including regular antiinflammatories: Flexeril , Lyrica , Meloxicam   Injections:  10/27/2023 C7-T1 Interlaminar ESI(no relief) 09/01/2022 C7-T1 Interlaminar ESI (no relief)  Past Surgery: no spinal surgeries   Joe Patterson has no symptoms of cervical myelopathy.  The symptoms are causing a significant impact on the patient's life.   Review of Systems:  A 10 point review of systems is negative, except for the pertinent positives and negatives detailed in the HPI.  Past Medical History: Past Medical History:  Diagnosis Date   Allergy 1990   Anxiety    Appendicitis 07/27/2019   Cyst of skin and subcutaneous tissue 06/22/2016   Eczema    Esophageal stricture    Feeling like committing suicide 06/17/2022   GERD (gastroesophageal reflux disease)    Medication monitoring encounter 11/20/2017   Morbid obesity (HCC) 11/21/2018   BMI >35 with GERD and hyperlipidemia   Non-celiac gluten sensitivity 12/29/2015    Preventative health care 07/05/2017   TMJ (dislocation of temporomandibular joint)    Weight gain 11/14/2017    Past Surgical History: Past Surgical History:  Procedure Laterality Date   APPENDECTOMY  2020   ESOPHAGEAL DILATION     2020   ESOPHAGOGASTRODUODENOSCOPY (EGD) WITH PROPOFOL  N/A 02/08/2019   Procedure: ESOPHAGOGASTRODUODENOSCOPY (EGD) WITH PROPOFOL ;  Surgeon: Therisa Bi, MD;  Location[facility] ENDOSCOPY;  Service: Gastroenterology;  Laterality: N/A;   HERNIA REPAIR Right    LAPAROSCOPIC APPENDECTOMY N/A 07/27/2019   Procedure: APPENDECTOMY LAPAROSCOPIC;  Surgeon: Jordis Laneta FALCON, MD;  Location: [facility] ORS;  Service: General;  Laterality: N/A;   TYMPANOSTOMY TUBE PLACEMENT     as a child    Allergies: Allergies as of 08/06/2024 - Review Complete 08/06/2024  Allergen Reaction Noted   Other Nausea Only 06/17/2022    Medications: Outpatient Encounter Medications as of 08/06/2024  Medication Sig   atorvastatin  (LIPITOR) 20 MG tablet TAKE 1 TABLET BY MOUTH EVERYDAY AT BEDTIME   cyclobenzaprine  (FLEXERIL ) 10 MG tablet TAKE 0.5 TO 1 TABLET BY MOUTH 3 TIMES A DAY AS NEEDED FOR MUSCLE SPASM   pantoprazole  (PROTONIX ) 40 MG tablet Take 1 tablet (40 mg total) by mouth 2 (two) times daily.   pregabalin  (LYRICA ) 50 MG capsule Take 1-2 capsules (50-100 mg total) by mouth 3 (three) times daily.   Turmeric 500 MG TABS Take 500 mg by mouth daily.   [DISCONTINUED] benzonatate  (TESSALON ) 100 MG capsule Take 1-2 capsules (100-200 mg total) by mouth 3 (three) times daily as needed for cough. (Patient not taking: Reported on 05/24/2024)   [  DISCONTINUED] cetirizine  (ZYRTEC ) 10 MG tablet Take 1 tablet (10 mg total) by mouth daily.   [DISCONTINUED] eszopiclone  (LUNESTA ) 2 MG TABS tablet Take 1 tablet (2 mg total) by mouth at bedtime as needed for sleep. Take immediately before bedtime   [DISCONTINUED] meloxicam  (MOBIC ) 15 MG tablet Take 1 tablet (15 mg total) by mouth daily. (Patient not taking:  Reported on 05/24/2024)   [DISCONTINUED] sertraline  (ZOLOFT ) 25 MG tablet TAKE 1 TABLET (25 MG TOTAL) BY MOUTH DAILY.   [DISCONTINUED] sertraline  (ZOLOFT ) 25 MG tablet Take 1 tablet (25 mg total) by mouth daily. (Patient not taking: Reported on 05/24/2024)   No facility-administered encounter medications on file as of 08/06/2024.    Social History: Social History   Tobacco Use   Smoking status: Former    Current packs/day: 0.00    Average packs/day: 1 pack/day for 15.0 years (15.0 ttl pk-yrs)    Types: Cigarettes    Start date: 12/22/1992    Quit date: 12/23/2007    Years since quitting: 16.6   Smokeless tobacco: Former    Types: Chew    Quit date: 2009  Vaping Use   Vaping status: Never Used  Substance Use Topics   Alcohol use: No   Drug use: No    Family Medical History: Family History  Problem Relation Age of Onset   Multiple sclerosis Mother    Hearing loss Father    Cancer Maternal Grandmother        lymphoma   Heart disease Maternal Grandfather        early 52s   Lung disease Paternal Grandmother    Blindness Paternal Grandmother    Parkinson's disease Paternal Grandfather    Cancer Maternal Aunt    COPD Neg Hx    Diabetes Neg Hx    Hypertension Neg Hx    Stroke Neg Hx    Colon cancer Neg Hx     Physical Examination: Vitals:   08/06/24 0855  BP: 128/82    General: Patient is well developed, well nourished, calm, collected, and in no apparent distress. Attention to examination is appropriate.  Respiratory: Patient is breathing without any difficulty.   NEUROLOGICAL:     Awake, alert, oriented to person, place, and time.  Speech is clear and fluent. Fund of knowledge is appropriate.   Cranial Nerves: Pupils equal round and reactive to light.  Facial tone is symmetric.    No posterior cervical tenderness. No tenderness in bilateral trapezial region. Mild left scapular tenderness.  No abnormal lesions on exposed skin.   Strength: Side Biceps Triceps  Deltoid Interossei Grip Wrist Ext. Wrist Flex.  R 5 5 5 5 5 5 5   L 5 5 5 5 5 5 5    Side Iliopsoas Quads Hamstring PF DF EHL  R 5 5 5 5 5 5   L 5 5 5 5 5 5    Reflexes are 2+ and symmetric at the biceps, brachioradialis, patella and achilles.   Hoffman's is absent.  Clonus is not present.   Bilateral upper and lower extremity sensation is intact to light touch.     Good ROM of both shoulders with no pain.   Gait is normal.     Medical Decision Making  Imaging: No cervical imaging  Assessment and Plan: Mr. Holsomback has a 2-3 year history of constant neck pain into the shoulder blades along with intermittent numbness/tingling in both arms to his hands (can be left, right, or both). Left arm symptoms > right arm symptoms.  He has intermittent pain in the arms as well, but it is worse in his hands. No weakness.   No cervical imaging available- states he had MRI done 2 years ago.   Treatment options discussed with patient and following plan made:   - MRI of cervical spine to evaluate chronic neck pain along with cervical xrays with flex/ext.  - Imaging to be done at [facility] in [place]. He will let us  know when imaging is done so we can get reports/images. - Continue on lyrica  and prn flexeril  from other providers.  - Will plan to do phone or MyChart visit to review imaging.  - He has failed conservative treatment (PT, injections, time, and medications) and is interested in surgery options. He would like to see Dr. Clois.   I spent a total of 45 minutes in face-to-face and non-face-to-face activities related to this patient's care today including review of outside records, review of imaging, review of symptoms, physical exam, discussion of differential diagnosis, discussion of treatment options, and documentation.   Thank you for involving me in the care of this patient.   Glade Boys PA-C Dept. of Neurosurgery

## 2024-08-06 ENCOUNTER — Ambulatory Visit: Admitting: Orthopedic Surgery

## 2024-08-06 ENCOUNTER — Encounter: Payer: Self-pay | Admitting: Orthopedic Surgery

## 2024-08-06 VITALS — BP 128/82 | Ht 71.0 in | Wt 195.8 lb

## 2024-08-06 DIAGNOSIS — M5412 Radiculopathy, cervical region: Secondary | ICD-10-CM

## 2024-08-06 DIAGNOSIS — M542 Cervicalgia: Secondary | ICD-10-CM | POA: Diagnosis not present

## 2024-08-06 DIAGNOSIS — R202 Paresthesia of skin: Secondary | ICD-10-CM | POA: Diagnosis not present

## 2024-08-06 DIAGNOSIS — R200 Anesthesia of skin: Secondary | ICD-10-CM

## 2024-08-06 NOTE — Patient Instructions (Signed)
 It was so nice to see you today. Thank you so much for coming in.    I want to get an MRI of your neck to look into things further. We will get this approved through your insurance and [facility] in [place] will call you to schedule the appointment. Ask about your patient responsibility. You do not need to pay this prior to getting MRI, they can bill you.   When you get the MRI done, they should do neck xrays as well.   Once you have the imaging done, let me know so we can get the images and report.   Once I have the imaging, I will review and we can do a MyChart or phone follow up. If you are a candidate for surgery, I will get you into see Dr. Clois.   Please do not hesitate to call if you have any questions or concerns. You can also message me in MyChart.   Glade Boys PA-C [phone]     The physicians and staff at [facility] at [facility] are committed to providing excellent care. You may receive a survey asking for feedback about your experience at our office. We value you your feedback and appreciate you taking the time to to fill it out. The [facility] leadership team is also available to discuss your experience in person, feel free to contact us  [phone].

## 2024-08-10 ENCOUNTER — Other Ambulatory Visit: Payer: Self-pay | Admitting: Family Medicine

## 2024-08-10 DIAGNOSIS — M5412 Radiculopathy, cervical region: Secondary | ICD-10-CM

## 2024-08-13 NOTE — Telephone Encounter (Signed)
 Requested medication (s) are due for refill today: yes  Requested medication (s) are on the active medication list: yes  Last refill:  05/20/24  Future visit scheduled: yes  Notes to clinic:  Unable to refill per protocol, cannot delegate.      Requested Prescriptions  Pending Prescriptions Disp Refills   cyclobenzaprine  (FLEXERIL ) 10 MG tablet [Pharmacy Med Name: CYCLOBENZAPRINE  10 MG TABLET] 90 tablet 1    Sig: TAKE 0.5 TO 1 TABLET BY MOUTH 3 TIMES A DAY AS NEEDED FOR MUSCLE SPASM     Not Delegated - Analgesics:  Muscle Relaxants Failed - 08/13/2024  8:32 AM      Failed - This refill cannot be delegated      Passed - Valid encounter within last 6 months    Recent Outpatient Visits           2 months ago Moderate episode of recurrent major depressive disorder [facility])   [facility] Leavy Mole, PA-C   6 months ago Cervical radiculopathy   [facility] Leavy Mole, PA-C

## 2024-08-19 ENCOUNTER — Inpatient Hospital Stay
Admission: RE | Admit: 2024-08-19 | Discharge: 2024-08-19 | Disposition: A | Discharge status: Home / Self Care | Payer: Self-pay | Source: Ambulatory Visit | Attending: Orthopedic Surgery | Admitting: Orthopedic Surgery

## 2024-08-19 ENCOUNTER — Other Ambulatory Visit: Payer: Self-pay

## 2024-08-19 DIAGNOSIS — Z049 Encounter for examination and observation for unspecified reason: Secondary | ICD-10-CM

## 2024-08-22 ENCOUNTER — Other Ambulatory Visit: Payer: Self-pay | Admitting: Family Medicine

## 2024-08-22 ENCOUNTER — Encounter: Payer: Self-pay | Admitting: Family Medicine

## 2024-08-22 DIAGNOSIS — F331 Major depressive disorder, recurrent, moderate: Secondary | ICD-10-CM

## 2024-08-22 MED ORDER — SERTRALINE HCL 25 MG PO TABS: 25.0000 mg | ORAL_TABLET | Freq: Every day | ORAL | 3 refills | Status: DC

## 2024-08-22 NOTE — Addendum Note (Signed)
 Addended by: Nial Hawe on: 08/22/2024 01:33 PM   Modules accepted: Orders

## 2024-08-23 ENCOUNTER — Telehealth: Payer: Self-pay

## 2024-08-23 ENCOUNTER — Telehealth: Admitting: Family Medicine

## 2024-08-23 NOTE — Telephone Encounter (Signed)
 Hello, Patient states that they have gotten their [id number] - MR CERVICAL SPINE WO CONTRAST images done at [facility] in [place], and the office is trying to send their images via [employer]. They are attempting to send today. Please request these images and let us  know so that we can schedule his appointment w/Stacy. Thanks!

## 2024-08-23 NOTE — Telephone Encounter (Signed)
 Refused Zoloft  25 mg because this is a duplicate request.   It was sent in by Michelene Cower, PA-C on 08/22/2024 #90, 3 refills.    (See MyChart message dated 08/22/2024 at 1:33 PM from Leisa to pt).   He requested to restart this medication.

## 2024-08-26 ENCOUNTER — Other Ambulatory Visit: Payer: Self-pay | Admitting: Family Medicine

## 2024-08-26 ENCOUNTER — Inpatient Hospital Stay
Admission: RE | Admit: 2024-08-26 | Discharge: 2024-08-26 | Disposition: A | Discharge status: Home / Self Care | Payer: Self-pay | Source: Ambulatory Visit | Attending: Orthopedic Surgery | Admitting: Orthopedic Surgery

## 2024-08-26 DIAGNOSIS — Z049 Encounter for examination and observation for unspecified reason: Secondary | ICD-10-CM

## 2024-08-26 NOTE — Telephone Encounter (Signed)
 I need the report for his cervical MRI.   Once this is scanned int, you can  schedule phone or MyChart visit with him to review the imaging.

## 2024-08-26 NOTE — Telephone Encounter (Signed)
MRI scanned into chart

## 2024-08-26 NOTE — Telephone Encounter (Signed)
 Images are loaded and ready to view.

## 2024-08-26 NOTE — Telephone Encounter (Signed)
 X-ray report in chart; awaiting MRI report.

## 2024-08-27 NOTE — Telephone Encounter (Signed)
 Please schedule either phone or mychart follow up with me to review his imaging.

## 2024-08-27 NOTE — Telephone Encounter (Signed)
 Appointment is scheduled for 9/19.

## 2024-08-28 NOTE — Progress Notes (Unsigned)
   Telephone Visit- Progress Note: Referring Physician:  Leavy Mole, PA-C [address] [place],  [place] [address]  Primary Physician:  Joe Mole, PA-C  This visit was performed via telephone.  Patient location: home Provider location: working from home  I spent a total of 15 minutes non-face-to-face activities for this visit on the date of this encounter including review of current clinical condition and response to treatment.    Patient has given verbal consent to this telephone visits and we reviewed the limitations of a telephone visit. Patient wishes to proceed.    Chief Complaint:  review imaging  History of Present Illness: Joe Patterson is a 48 y.o. male has a history of  hyperlipidemia, hypertriglyceridemia, depression, GERD, obesity.    Last seen by me on 08/06/24 for neck pain that radiated into his shoulder blades with intermittent numbness/tingling/pain in both arms to his hands (can be left, right, or both). Left arm symptoms > right arm symptoms.  Phone visit scheduled to review his cervical MRI and xrays. He is interested in any surgery options with Dr. Clois.   He is about the same. He has constant neck pain with pain that radiates into his shoulder blades. He has intermittent pain/numbness/tingling in his arms into his hands. Pain is worse with typing at work and leaning against a chair at work.   Never diagnosed with carpal tunnel, but he has splints to wear at night that help with his symptoms.    Previous Patterson made him worse. No relief with ESIs. He is taking flexeril  and lyrica .    He had injections at [facility].    Tobacco use:  Does not smoke.    Bowel/Bladder Dysfunction: none   Conservative measures:  Physical therapy: Joe Patterson in [place] initial eval 12/22/23 for cervical spine and was discharged to [facility] on 05/03/24.  Multimodal medical therapy including regular antiinflammatories: Flexeril , Lyrica , Meloxicam   Injections:   10/27/2023 C7-T1 Interlaminar ESI(no relief) 09/01/2022 C7-T1 Interlaminar ESI (no relief)   Past Surgery: no spinal surgeries    Joe Patterson has no symptoms of cervical myelopathy.  Exam: No exam done as this was a telephone encounter.     Imaging: Cervical xrays dated 08/20/24:    Cervical MRI dated 08/20/24:      I have personally reviewed the images and agree with the above interpretation.   Assessment and Plan: Mr. Minichiello has constant neck pain with pain that radiates into his shoulder blades. He has intermittent pain/numbness/tingling in his arms into his hands. Pain is worse with typing at work and leaning against a chair at work.   Never diagnosed with carpal tunnel, but he has splints to wear at night that help with his symptoms.   He has cervical spondylosis with multilevel foraminal stenosis. He has mild central stenosis with severe right/moderate left foraminal stenosis C5-C6, moderate left foraminal stenosis C6-V7, and severe left/moderate right foraminal stenosis C3-C4.    Treatment options discussed with patient and following plan made:   - He has failed conservative treatment including Patterson, injections,  medications, and time.  - He is interested in any surgery options. Follow up scheduled with Dr. Clois to discuss further.  - Numbness/tingling in his hands may be cervical mediated but is suspicious for peripheral neuropathy. EMG ordered with Joe Patterson.  - Continue on lyrica  and prn flexeril  from other providers.   Joe Boys PA-C Neurosurgery

## 2024-08-30 ENCOUNTER — Encounter: Payer: Self-pay | Admitting: Orthopedic Surgery

## 2024-08-30 ENCOUNTER — Ambulatory Visit: Admitting: Orthopedic Surgery

## 2024-08-30 DIAGNOSIS — R202 Paresthesia of skin: Secondary | ICD-10-CM

## 2024-08-30 DIAGNOSIS — M4802 Spinal stenosis, cervical region: Secondary | ICD-10-CM

## 2024-08-30 DIAGNOSIS — R200 Anesthesia of skin: Secondary | ICD-10-CM

## 2024-08-30 DIAGNOSIS — M47812 Spondylosis without myelopathy or radiculopathy, cervical region: Secondary | ICD-10-CM

## 2024-08-30 DIAGNOSIS — M5412 Radiculopathy, cervical region: Secondary | ICD-10-CM

## 2024-09-02 ENCOUNTER — Other Ambulatory Visit: Payer: Self-pay | Admitting: Family Medicine

## 2024-09-02 ENCOUNTER — Encounter: Payer: Self-pay | Admitting: Neurology

## 2024-09-02 ENCOUNTER — Other Ambulatory Visit: Payer: Self-pay

## 2024-09-02 DIAGNOSIS — M5412 Radiculopathy, cervical region: Secondary | ICD-10-CM

## 2024-09-02 DIAGNOSIS — R202 Paresthesia of skin: Secondary | ICD-10-CM

## 2024-09-03 ENCOUNTER — Encounter: Payer: Self-pay | Admitting: Family Medicine

## 2024-09-03 ENCOUNTER — Ambulatory Visit: Admitting: Family Medicine

## 2024-09-03 VITALS — BP 120/78 | HR 85 | Resp 16 | Ht 71.0 in | Wt 196.0 lb

## 2024-09-03 DIAGNOSIS — M2550 Pain in unspecified joint: Secondary | ICD-10-CM | POA: Diagnosis not present

## 2024-09-03 DIAGNOSIS — M542 Cervicalgia: Secondary | ICD-10-CM

## 2024-09-03 DIAGNOSIS — F331 Major depressive disorder, recurrent, moderate: Secondary | ICD-10-CM | POA: Diagnosis not present

## 2024-09-03 DIAGNOSIS — M25561 Pain in right knee: Secondary | ICD-10-CM

## 2024-09-03 DIAGNOSIS — M5412 Radiculopathy, cervical region: Secondary | ICD-10-CM

## 2024-09-03 DIAGNOSIS — G8929 Other chronic pain: Secondary | ICD-10-CM

## 2024-09-03 MED ORDER — CELECOXIB 100 MG PO CAPS: 100.0000 mg | ORAL_CAPSULE | Freq: Two times a day (BID) | ORAL | 1 refills | Status: DC | PRN

## 2024-09-03 MED ORDER — PREGABALIN 50 MG PO CAPS: 50.0000 mg | ORAL_CAPSULE | Freq: Three times a day (TID) | ORAL | 0 refills | Status: DC

## 2024-09-03 NOTE — Telephone Encounter (Signed)
 Requested Prescriptions  Refused Prescriptions Disp Refills   celecoxib  (CELEBREX ) 100 MG capsule [Pharmacy Med Name: CELECOXIB  100 MG CAPSULE] 60 capsule 3    Sig: TAKE 1 CAPSULE BY MOUTH TWICE A DAY     Analgesics:  COX2 Inhibitors Failed - 09/03/2024  2:07 PM      Failed - Manual Review: Labs are only required if the patient has taken medication for more than 8 weeks.      Passed - HGB in normal range and within 360 days    Hemoglobin  Date Value Ref Range Status  11/03/2023 14.0 13.2 - 17.1 g/dL Final         Passed - Cr in normal range and within 360 days    Creat  Date Value Ref Range Status  11/03/2023 0.80 0.60 - 1.29 mg/dL Final         Passed - HCT in normal range and within 360 days    HCT  Date Value Ref Range Status  11/03/2023 42.4 38.5 - 50.0 % Final         Passed - AST in normal range and within 360 days    AST  Date Value Ref Range Status  11/03/2023 20 10 - 40 U/L Final         Passed - ALT in normal range and within 360 days    ALT  Date Value Ref Range Status  11/03/2023 30 9 - 46 U/L Final         Passed - eGFR is 30 or above and within 360 days    GFR, Est African American  Date Value Ref Range Status  02/26/2021 113 > OR = 60 mL/min/1.63m2 Final   GFR, Est Non African American  Date Value Ref Range Status  02/26/2021 98 > OR = 60 mL/min/1.28m2 Final   eGFR  Date Value Ref Range Status  11/03/2023 111 > OR = 60 mL/min/1.41m2 Final         Passed - Patient is not pregnant      Passed - Valid encounter within last 12 months    Recent Outpatient Visits           Today Polyarthralgia   [facility] Leavy Mole, PA-C   3 months ago Moderate episode of recurrent major depressive disorder [facility])   [facility] Leavy Mole, PA-C   7 months ago Cervical radiculopathy   [facility] Leavy Mole, PA-C

## 2024-09-03 NOTE — Progress Notes (Signed)
 Patient ID: Joe Patterson, male    DOB: Dec 08, 1976, 48 y.o.   MRN: [id number]  PCP: Leavy Mole, PA-C  Chief Complaint  Patient presents with   Consult    Discuss getting Celebrex     Subjective:   Joe Patterson is a 48 y.o. male, presents to clinic with CC of the following:  HPI  Here to ask about change from mobic  to celebrex , chronic pain, doing surgery soon Seeing neutrosurgeon yarbrough oct  Got immunization recently and this flares up his nerve pain symptoms Tetanus booster Friday 9/19 9/20 nerve pain worse to upper back neck  No gi pain right now, on ppi, has been on celebrex  100 mg bid and needs increase  Discussed the use of AI scribe software for clinical note transcription with the patient, who gave verbal consent to proceed.  History of Present Illness Joe Patterson is a 48 year old male with chronic back pain and nerve issues who presents with worsening symptoms following a Tdap booster.  Neuropathic pain and back discomfort - Chronic nerve pain and back discomfort, typically localized between the shoulder blades, consistent with prior pinched nerve symptoms - Worsening of nerve pain following Tdap booster received on Friday night; increased pain by Saturday night - Pain exacerbated by sitting in chairs that contact the area of discomfort - Mother experiences similar post-vaccination symptoms - History of chronic back pain under care of orthopedic and neurosurgeons - Scheduled neurosurgery consultation with Dr. Katrina in October and EMG in November - Previous MRI and x-ray imaging completed  Pharmacologic pain management - Current medications include pregabalin , titrated between 250 mg and 350 mg daily based on activity and pain severity - Uses Tylenol  and muscle relaxers for additional pain control - Previously trialed meloxicam , discontinued due to gastrointestinal upset - Currently taking Celebrex  100 mg twice daily, perceived as insufficient for  pain control - No use of other NSAIDs such as ibuprofen, Aleve, or naproxen  Right knee bursitis - Right knee bursitis developed after a [place] walk [place] the beach approximately one month ago - Impaired ability to exercise, which previously occurred daily for physical and mental health - No orthopedic evaluation pursued due to financial constraints and prioritization of back surgery  Impact of symptoms on daily life and care access - Financial and time constraints affecting ability to seek medical care and specialist evaluation    Patient Active Problem List   Diagnosis Date Noted   Insomnia due to medical condition 05/24/2024   Advanced care planning/counseling discussion 11/03/2023   Mass of right inguinal region 08/04/2022   Cervical radiculopathy 07/28/2022   Bilateral carpal tunnel syndrome 06/17/2022   Circadian rhythm sleep disorder, shift work type 06/17/2022   Low back pain 06/17/2022   Moderate episode of recurrent major depressive disorder (HCC) 06/17/2022   Plantar fascia rupture 01/12/2022   Hypertriglyceridemia 11/26/2018   Elevated glucose 11/26/2018   Allergic rhinitis 11/14/2017   Hyperlipidemia 11/14/2017   TMJ (temporomandibular joint syndrome) 06/22/2016   Shoulder pain, bilateral 06/22/2016   Social anxiety disorder 12/29/2015   Allergy to perfume 12/29/2015   History of esophageal stricture 12/29/2015   Eczema, allergic 12/29/2015   Gastroesophageal reflux 12/29/2015      Current Outpatient Medications:    atorvastatin  (LIPITOR) 20 MG tablet, TAKE 1 TABLET BY MOUTH EVERYDAY AT BEDTIME, Disp: 90 tablet, Rfl: 3   cyclobenzaprine  (FLEXERIL ) 10 MG tablet, TAKE 0.5 TO 1 TABLET BY MOUTH 3 TIMES A DAY AS NEEDED FOR  MUSCLE SPASM, Disp: 90 tablet, Rfl: 0   pantoprazole  (PROTONIX ) 40 MG tablet, Take 1 tablet (40 mg total) by mouth 2 (two) times daily., Disp: 180 tablet, Rfl: 3   pregabalin  (LYRICA ) 50 MG capsule, Take 1-2 capsules (50-100 mg total) by mouth 3  (three) times daily., Disp: 540 capsule, Rfl: 1   sertraline  (ZOLOFT ) 25 MG tablet, Take 1 tablet (25 mg total) by mouth daily., Disp: 90 tablet, Rfl: 3   Turmeric 500 MG TABS, Take 500 mg by mouth daily., Disp: , Rfl:    Allergies  Allergen Reactions   Other Nausea Only     Social History   Tobacco Use   Smoking status: Former    Current packs/day: 0.00    Average packs/day: 1 pack/day for 15.0 years (15.0 ttl pk-yrs)    Types: Cigarettes    Start date: 12/22/1992    Quit date: 12/23/2007    Years since quitting: 16.7   Smokeless tobacco: Former    Types: Chew    Quit date: 2009  Vaping Use   Vaping status: Never Used  Substance Use Topics   Alcohol use: No   Drug use: No      Chart Review Today: I personally reviewed active problem list, medication list, allergies, family history, social history, health maintenance, notes from last encounter, lab results, imaging with the patient/caregiver today.   Review of Systems  All other systems reviewed and are negative.      Objective:   Vitals:   09/03/24 1017  BP: 120/78  Pulse: 85  Resp: 16  SpO2: 96%  Weight: 196 lb (88.9 kg)  Height: 5' 11 (1.803 m)    Body mass index is 27.34 kg/m.  Physical Exam Vitals and nursing note reviewed.  Constitutional:      General: He is not in acute distress.    Appearance: Normal appearance. He is well-developed. He is not ill-appearing, toxic-appearing or diaphoretic.  HENT:     Head: Normocephalic and atraumatic.     Right Ear: External ear normal.     Left Ear: External ear normal.     Nose: Nose normal.  Eyes:     General: No scleral icterus.       Right eye: No discharge.        Left eye: No discharge.     Conjunctiva/sclera: Conjunctivae normal.  Neck:     Trachea: Trachea and phonation normal. No tracheal deviation.  Cardiovascular:     Rate and Rhythm: Normal rate.  Pulmonary:     Effort: Pulmonary effort is normal. No respiratory distress.     Breath  sounds: No stridor.  Musculoskeletal:     Cervical back: Rigidity present. Muscular tenderness present. Decreased range of motion.  Skin:    General: Skin is warm and dry.     Findings: No rash.  Neurological:     Mental Status: He is alert.     Motor: No abnormal muscle tone.     Coordination: Coordination normal.     Gait: Gait normal.  Psychiatric:        Mood and Affect: Mood normal.        Behavior: Behavior normal.      Results for orders placed or performed in visit on 01/03/24  Sjogrens syndrome-A extractable nuclear antibody   Collection Time: 01/19/24  4:20 PM  Result Value Ref Range   SSA (Ro) (ENA) Antibody, IgG <1.0 NEG <1.0 NEG AI  Sjogrens syndrome-B extractable nuclear antibody  Collection Time: 01/19/24  4:20 PM  Result Value Ref Range   SSB (La) (ENA) Antibody, IgG <1.0 NEG <1.0 NEG AI  ANA   Collection Time: 01/19/24  4:20 PM  Result Value Ref Range   Anti Nuclear Antibody (ANA) POSITIVE (A) NEGATIVE  C-reactive protein   Collection Time: 01/19/24  4:20 PM  Result Value Ref Range   CRP <3.0 <8.0 mg/L  Rheumatoid factor   Collection Time: 01/19/24  4:20 PM  Result Value Ref Range   Rheumatoid fact SerPl-aCnc <10 <14 IU/mL  Sedimentation rate   Collection Time: 01/19/24  4:20 PM  Result Value Ref Range   Sed Rate 6 0 - 15 mm/h  Anti-nuclear ab-titer (ANA titer)   Collection Time: 01/19/24  4:20 PM  Result Value Ref Range   ANA Titer 1 1:80 (H) titer   ANA Pattern 1 Nuclear, Homogeneous (A)        Assessment & Plan:      ICD-10-CM   1. Polyarthralgia  M25.50 celecoxib  (CELEBREX ) 100 MG capsule    2. Cervical radiculopathy  M54.12 celecoxib  (CELEBREX ) 100 MG capsule    pregabalin  (LYRICA ) 50 MG capsule    3. Neck pain, chronic  M54.2 celecoxib  (CELEBREX ) 100 MG capsule   G89.29     4. Moderate episode of recurrent major depressive disorder (HCC)  F33.1     5. Right knee pain, unspecified chronicity  M25.561        Assessment &  Plan  1. Polyarthralgia (Primary) Pt requests increase dose in celebrex  today - celecoxib  (CELEBREX ) 100 MG capsule; Take 1-2 capsules (100-200 mg total) by mouth 2 (two) times daily as needed.  Dispense: 180 capsule; Refill: 1  2. Cervical radiculopathy Cervical radiculopathy with acute on chronic pain Chronic cervical radiculopathy with worsening symptoms, particularly between the shoulder blades. Recent Tdap vaccine may have exacerbated nerve pain. Concerns about medication dosing and potential interactions with upcoming surgery. - Increase pregabalin  dosing range to 50-150 mg three times a day. - Consult with neurosurgery regarding pregabalin  dosing and management pre- and post-surgery. - Monitor for signs of GI bleeding due to potential interaction between sertraline  and NSAIDs. - Continue current medications including pantoprazole  for stomach protection. - celecoxib  (CELEBREX ) 100 MG capsule; Take 1-2 capsules (100-200 mg total) by mouth 2 (two) times daily as needed.  Dispense: 180 capsule; Refill: 1 - pregabalin  (LYRICA ) 50 MG capsule; Take 1-3 capsules (50-150 mg total) by mouth 3 (three) times daily.  Dispense: 810 capsule; Refill: 0  3. Neck pain, chronic See above - celecoxib  (CELEBREX ) 100 MG capsule; Take 1-2 capsules (100-200 mg total) by mouth 2 (two) times daily as needed.  Dispense: 180 capsule; Refill: 1  4. Moderate episode of recurrent major depressive disorder (HCC) doing well restarting zoloft   Major depressive disorder managed with sertraline . Recent discontinuation attempt was not well-timed due to knee pain limiting physical activity, impacting mental health. - Continue sertraline  as prescribed.    05/24/2024    2:02 PM 11/03/2023    8:04 AM 10/27/2023    1:50 PM  Depression screen PHQ 2/9  Decreased Interest 0 0 0  Down, Depressed, Hopeless 0 0 0  PHQ - 2 Score 0 0 0  Altered sleeping 0 0 0  Tired, decreased energy 0 0 0  Change in appetite 0 0 0  Feeling  bad or failure about yourself  0 0 0  Trouble concentrating 0 0 0  Moving slowly or fidgety/restless 0 0 0  Suicidal  thoughts 0 0 0  PHQ-9 Score 0 0 0  Difficult doing work/chores Not difficult at all Not difficult at all Not difficult at all     5. Right knee pain, unspecified chronicity Right knee pain, pt states is likely bursitis, Right medial knee pain  following prolonged walking. Financial and logistical constraints limit ability to pursue further specialist evaluation. Current treatment with anti-inflammatories is limited by stomach tolerance. - Increase Celebrex  to 200 mg twice a day for a short period during flare-up, then reduce to regular dosing. - Consider knee brace for support and to limit range of motion. - Monitor for improvement with increased Celebrex  dosing. - offered ortho consult but he declined       Recording duration: 21 minutes  Msg sent to neurosurgery PA to inquire about meds/dosing and pt concerns with possible surgery coming up   Michelene Cower, PA-C 09/03/24 10:39 AM

## 2024-09-05 ENCOUNTER — Encounter: Payer: Self-pay | Admitting: Family Medicine

## 2024-09-05 ENCOUNTER — Telehealth: Payer: Self-pay | Admitting: Orthopedic Surgery

## 2024-09-05 NOTE — Telephone Encounter (Signed)
 Message from his PCP to me about possibly increasing his celebrex  and lyrica .   I would not recommend this as he is already on max doses.   Please call him and get some additional information.   Looks like he was taking flexeril . Could consider a different muscle relaxer?

## 2024-09-05 NOTE — Telephone Encounter (Signed)
 I spoke to patient and informed him of the message below. He does not want to try a different muscle relaxer. He states they make him sleepy and he can only take them at night. He just changed the dosing on the other medications 2 days ago so he going to stick with the current plan and see how it does. He is scheduled on Thursday to discuss surgery.

## 2024-09-11 NOTE — H&P (View-Only) (Signed)
 Referring Physician:  Leavy Mole, PA-C [address] [place],  [place] [address]  Primary Physician:  Leavy Mole, PA-C  History of Present Illness: 09/12/2024 [name] is here today with a chief complaint of neck and arm pain.  This is well detailed below.  He has pain that extends down his left forearm into his hand.  He has pain in all 5 digits.  Previously was worsening to his middle finger but is now bothering him his 4th and 5th digit.  He also has numbness in his 1st and 2nd digit.  He has significant pain into his trapezius muscle when he rotates his neck to the left.  He has tried and failed conservative management.  He tried physical therapy without improvement.  He has had symptoms for 2 years.  Joe Patterson has no symptoms of cervical myelopathy.  The symptoms are causing a significant impact on the patient's life.   I have utilized the care everywhere function in epic to review the outside records available from external health systems.  08/30/2024 Note from Glade Boys, [place] History of Present Illness: Joe Patterson is a 48 y.o. male has a history of  hyperlipidemia, hypertriglyceridemia, depression, GERD, obesity.    Last seen by me on 08/06/24 for neck pain that radiated into his shoulder blades with intermittent numbness/tingling/pain in both arms to his hands (can be left, right, or both). Left arm symptoms > right arm symptoms.   Phone visit scheduled to review his cervical MRI and xrays. He is interested in any surgery options with Dr. Clois.    He is about the same. He has constant neck pain with pain that radiates into his shoulder blades. He has intermittent pain/numbness/tingling in his arms into his hands. Pain is worse with typing at work and leaning against a chair at work.    Never diagnosed with carpal tunnel, but he has splints to wear at night that help with his symptoms.    Previous PT made him worse. No relief with  ESIs. He is taking flexeril  and lyrica .    He had injections at [facility].    Tobacco use:  Does not smoke.    Bowel/Bladder Dysfunction: none   Conservative measures:  Physical therapy: [facility] in [place] initial eval 12/22/23 for cervical spine and was discharged to [facility] on 05/03/24.  Multimodal medical therapy including regular antiinflammatories: Flexeril , Lyrica , Meloxicam   Injections:  10/27/2023 C7-T1 Interlaminar ESI(no relief) 09/01/2022 C7-T1 Interlaminar ESI (no relief)   Past Surgery: no spinal surgeries      Review of Systems:  A 10 point review of systems is negative, except for the pertinent positives and negatives detailed in the HPI.  Past Medical History: Past Medical History:  Diagnosis Date   Allergy 1990   Anxiety    Appendicitis 07/27/2019   Cyst of skin and subcutaneous tissue 06/22/2016   Eczema    Esophageal stricture    Feeling like committing suicide 06/17/2022   GERD (gastroesophageal reflux disease)    Medication monitoring encounter 11/20/2017   Morbid obesity (HCC) 11/21/2018   BMI >35 with GERD and hyperlipidemia   Non-celiac gluten sensitivity 12/29/2015   Preventative health care 07/05/2017   TMJ (dislocation of temporomandibular joint)    Weight gain 11/14/2017    Past Surgical History: Past Surgical History:  Procedure Laterality Date   APPENDECTOMY  2020   ESOPHAGEAL DILATION     2020   ESOPHAGOGASTRODUODENOSCOPY (EGD) WITH PROPOFOL  N/A 02/08/2019  Procedure: ESOPHAGOGASTRODUODENOSCOPY (EGD) WITH PROPOFOL ;  Surgeon: Therisa Bi, MD;  Location: [facility] ENDOSCOPY;  Service: Gastroenterology;  Laterality: N/A;   HERNIA REPAIR Right    LAPAROSCOPIC APPENDECTOMY N/A 07/27/2019   Procedure: APPENDECTOMY LAPAROSCOPIC;  Surgeon: Jordis Laneta FALCON, MD;  Location: [facility] ORS;  Service: General;  Laterality: N/A;   TYMPANOSTOMY TUBE PLACEMENT     as a child    Allergies: Allergies as of 09/12/2024 - Review Complete 09/12/2024  Allergen  Reaction Noted   Cat dander Itching and Nausea And Vomiting 09/12/2024   Molds & smuts Itching and Nausea And Vomiting 09/12/2024   Other Nausea Only 06/17/2022    Medications:  Current Outpatient Medications:    atorvastatin  (LIPITOR) 20 MG tablet, TAKE 1 TABLET BY MOUTH EVERYDAY AT BEDTIME, Disp: 90 tablet, Rfl: 3   celecoxib  (CELEBREX ) 100 MG capsule, Take 1-2 capsules (100-200 mg total) by mouth 2 (two) times daily as needed., Disp: 180 capsule, Rfl: 1   cyclobenzaprine  (FLEXERIL ) 10 MG tablet, TAKE 0.5 TO 1 TABLET BY MOUTH 3 TIMES A DAY AS NEEDED FOR MUSCLE SPASM, Disp: 90 tablet, Rfl: 0   pantoprazole  (PROTONIX ) 40 MG tablet, Take 1 tablet (40 mg total) by mouth 2 (two) times daily., Disp: 180 tablet, Rfl: 3   pregabalin  (LYRICA ) 50 MG capsule, Take 1-3 capsules (50-150 mg total) by mouth 3 (three) times daily., Disp: 810 capsule, Rfl: 0   sertraline  (ZOLOFT ) 25 MG tablet, Take 1 tablet (25 mg total) by mouth daily., Disp: 90 tablet, Rfl: 3   Turmeric 500 MG TABS, Take 500 mg by mouth daily., Disp: , Rfl:   Social History: Social History   Tobacco Use   Smoking status: Former    Current packs/day: 0.00    Average packs/day: 1 pack/day for 15.0 years (15.0 ttl pk-yrs)    Types: Cigarettes    Start date: 12/22/1992    Quit date: 12/23/2007    Years since quitting: 16.7   Smokeless tobacco: Former    Types: Chew    Quit date: 2009  Vaping Use   Vaping status: Never Used  Substance Use Topics   Alcohol use: No   Drug use: No    Family Medical History: Family History  Problem Relation Age of Onset   Multiple sclerosis Mother    Hearing loss Father    Cancer Maternal Grandmother        lymphoma   Heart disease Maternal Grandfather        early 75s   Lung disease Paternal Grandmother    Blindness Paternal Grandmother    Parkinson's disease Paternal Grandfather    Cancer Maternal Aunt    COPD Neg Hx    Diabetes Neg Hx    Hypertension Neg Hx    Stroke Neg Hx    Colon  cancer Neg Hx     Physical Examination: Vitals:   09/12/24 1502  BP: (!) 152/108    General: Patient is in no apparent distress. Attention to examination is appropriate.  Neck:   Supple.  Full range of motion.  Respiratory: Patient is breathing without any difficulty.   NEUROLOGICAL:     Awake, alert, oriented to person, place, and time.  Speech is clear and fluent.   Cranial Nerves: Pupils equal round and reactive to light.  Facial tone is symmetric.  Facial sensation is symmetric. Shoulder shrug is symmetric. Tongue protrusion is midline.  There is no pronator drift.  Strength: Side Biceps Triceps Deltoid Interossei Grip Wrist Ext. Wrist Flex.  R 5 5 5 5 5 5 5   L 5 5 5 5 5 5 5    Side Iliopsoas Quads Hamstring PF DF EHL  R 5 5 5 5 5 5   L 5 5 5 5 5 5    Reflexes are 1+ and symmetric at the biceps, triceps, brachioradialis, patella and achilles.   Hoffman's is absent.   Bilateral upper and lower extremity sensation is intact to light touch.    No evidence of dysmetria noted.  Gait is normal.     Medical Decision Making  Imaging: MRI C spine 08/20/2024 Degenerative changes of the cervical spine most pronounced at C5-6 and C6-7.  Multilevel potentially significant foraminal stenoses are most pronounced on the left at C3-4 and on the right at C5-6 where there are moderate to severe stenoses.  I have personally reviewed the images and agree with the above interpretation.  Assessment and Plan: Mr. Biggins is a pleasant 48 y.o. male with cervical radiculopathy due to neuroforaminal stenosis at C3-4 and C5-6.  I think he may have left-sided cubital tunnel syndrome.  He has bilateral radicular symptoms.  He has tried and failed conservative management for cervical radiculopathy.  I recommended surgical intervention.  I would like to review his MRI scan with one of the neuroradiologist.  I recommended either C3-4 and C5-6 arthroplasty versus C3-4 and C5-7 anterior cervical  discectomy and fusion.  I will ask my colleague in neurology to move up his nerve conduction study.  I discussed the planned procedure at length with the patient, including the risks, benefits, alternatives, and indications. The risks discussed include but are not limited to bleeding, infection, need for reoperation, spinal fluid leak, stroke, vision loss, anesthetic complication, coma, paralysis, and even death. We also discussed the possibility of post-operative dysphagia, vocal cord paralysis, and the risk of adjacent segment disease in the future. I also described in detail that improvement was not guaranteed.  The patient expressed understanding of these risks, and asked that we proceed with surgery. I described the surgery in layman's terms, and gave ample opportunity for questions, which were answered to the best of my ability.       Thank you for involving me in the care of this patient.      Jacci Ruberg K. Clois MD, [facility] Neurosurgery

## 2024-09-11 NOTE — Progress Notes (Unsigned)
 Referring Physician:  Leavy Mole, PA-C [address] [place],  [place] [address]  Primary Physician:  Leavy Mole, PA-C  History of Present Illness: 09/12/2024 [name] is here today with a chief complaint of neck and arm pain.  This is well detailed below.  He has pain that extends down his left forearm into his hand.  He has pain in all 5 digits.  Previously was worsening to his middle finger but is now bothering him his 4th and 5th digit.  He also has numbness in his 1st and 2nd digit.  He has significant pain into his trapezius muscle when he rotates his neck to the left.  He has tried and failed conservative management.  He tried physical therapy without improvement.  He has had symptoms for 2 years.  Joe Patterson has no symptoms of cervical myelopathy.  The symptoms are causing a significant impact on the patient's life.   I have utilized the care everywhere function in epic to review the outside records available from external health systems.  08/30/2024 Note from Glade Boys, [place] History of Present Illness: Joe Patterson is a 48 y.o. male has a history of  hyperlipidemia, hypertriglyceridemia, depression, GERD, obesity.    Last seen by me on 08/06/24 for neck pain that radiated into his shoulder blades with intermittent numbness/tingling/pain in both arms to his hands (can be left, right, or both). Left arm symptoms > right arm symptoms.   Phone visit scheduled to review his cervical MRI and xrays. He is interested in any surgery options with Dr. Clois.    He is about the same. He has constant neck pain with pain that radiates into his shoulder blades. He has intermittent pain/numbness/tingling in his arms into his hands. Pain is worse with typing at work and leaning against a chair at work.    Never diagnosed with carpal tunnel, but he has splints to wear at night that help with his symptoms.    Previous PT made him worse. No relief with  ESIs. He is taking flexeril  and lyrica .    He had injections at [facility].    Tobacco use:  Does not smoke.    Bowel/Bladder Dysfunction: none   Conservative measures:  Physical therapy: [facility] in [place] initial eval 12/22/23 for cervical spine and was discharged to [facility] on 05/03/24.  Multimodal medical therapy including regular antiinflammatories: Flexeril , Lyrica , Meloxicam   Injections:  10/27/2023 C7-T1 Interlaminar ESI(no relief) 09/01/2022 C7-T1 Interlaminar ESI (no relief)   Past Surgery: no spinal surgeries      Review of Systems:  A 10 point review of systems is negative, except for the pertinent positives and negatives detailed in the HPI.  Past Medical History: Past Medical History:  Diagnosis Date   Allergy 1990   Anxiety    Appendicitis 07/27/2019   Cyst of skin and subcutaneous tissue 06/22/2016   Eczema    Esophageal stricture    Feeling like committing suicide 06/17/2022   GERD (gastroesophageal reflux disease)    Medication monitoring encounter 11/20/2017   Morbid obesity (HCC) 11/21/2018   BMI >35 with GERD and hyperlipidemia   Non-celiac gluten sensitivity 12/29/2015   Preventative health care 07/05/2017   TMJ (dislocation of temporomandibular joint)    Weight gain 11/14/2017    Past Surgical History: Past Surgical History:  Procedure Laterality Date   APPENDECTOMY  2020   ESOPHAGEAL DILATION     2020   ESOPHAGOGASTRODUODENOSCOPY (EGD) WITH PROPOFOL  N/A 02/08/2019  Procedure: ESOPHAGOGASTRODUODENOSCOPY (EGD) WITH PROPOFOL ;  Surgeon: Therisa Bi, MD;  Location: [facility] ENDOSCOPY;  Service: Gastroenterology;  Laterality: N/A;   HERNIA REPAIR Right    LAPAROSCOPIC APPENDECTOMY N/A 07/27/2019   Procedure: APPENDECTOMY LAPAROSCOPIC;  Surgeon: Jordis Laneta FALCON, MD;  Location: [facility] ORS;  Service: General;  Laterality: N/A;   TYMPANOSTOMY TUBE PLACEMENT     as a child    Allergies: Allergies as of 09/12/2024 - Review Complete 09/12/2024  Allergen  Reaction Noted   Cat dander Itching and Nausea And Vomiting 09/12/2024   Molds & smuts Itching and Nausea And Vomiting 09/12/2024   Other Nausea Only 06/17/2022    Medications:  Current Outpatient Medications:    atorvastatin  (LIPITOR) 20 MG tablet, TAKE 1 TABLET BY MOUTH EVERYDAY AT BEDTIME, Disp: 90 tablet, Rfl: 3   celecoxib  (CELEBREX ) 100 MG capsule, Take 1-2 capsules (100-200 mg total) by mouth 2 (two) times daily as needed., Disp: 180 capsule, Rfl: 1   cyclobenzaprine  (FLEXERIL ) 10 MG tablet, TAKE 0.5 TO 1 TABLET BY MOUTH 3 TIMES A DAY AS NEEDED FOR MUSCLE SPASM, Disp: 90 tablet, Rfl: 0   pantoprazole  (PROTONIX ) 40 MG tablet, Take 1 tablet (40 mg total) by mouth 2 (two) times daily., Disp: 180 tablet, Rfl: 3   pregabalin  (LYRICA ) 50 MG capsule, Take 1-3 capsules (50-150 mg total) by mouth 3 (three) times daily., Disp: 810 capsule, Rfl: 0   sertraline  (ZOLOFT ) 25 MG tablet, Take 1 tablet (25 mg total) by mouth daily., Disp: 90 tablet, Rfl: 3   Turmeric 500 MG TABS, Take 500 mg by mouth daily., Disp: , Rfl:   Social History: Social History   Tobacco Use   Smoking status: Former    Current packs/day: 0.00    Average packs/day: 1 pack/day for 15.0 years (15.0 ttl pk-yrs)    Types: Cigarettes    Start date: 12/22/1992    Quit date: 12/23/2007    Years since quitting: 16.7   Smokeless tobacco: Former    Types: Chew    Quit date: 2009  Vaping Use   Vaping status: Never Used  Substance Use Topics   Alcohol use: No   Drug use: No    Family Medical History: Family History  Problem Relation Age of Onset   Multiple sclerosis Mother    Hearing loss Father    Cancer Maternal Grandmother        lymphoma   Heart disease Maternal Grandfather        early 75s   Lung disease Paternal Grandmother    Blindness Paternal Grandmother    Parkinson's disease Paternal Grandfather    Cancer Maternal Aunt    COPD Neg Hx    Diabetes Neg Hx    Hypertension Neg Hx    Stroke Neg Hx    Colon  cancer Neg Hx     Physical Examination: Vitals:   09/12/24 1502  BP: (!) 152/108    General: Patient is in no apparent distress. Attention to examination is appropriate.  Neck:   Supple.  Full range of motion.  Respiratory: Patient is breathing without any difficulty.   NEUROLOGICAL:     Awake, alert, oriented to person, place, and time.  Speech is clear and fluent.   Cranial Nerves: Pupils equal round and reactive to light.  Facial tone is symmetric.  Facial sensation is symmetric. Shoulder shrug is symmetric. Tongue protrusion is midline.  There is no pronator drift.  Strength: Side Biceps Triceps Deltoid Interossei Grip Wrist Ext. Wrist Flex.  R 5 5 5 5 5 5 5   L 5 5 5 5 5 5 5    Side Iliopsoas Quads Hamstring PF DF EHL  R 5 5 5 5 5 5   L 5 5 5 5 5 5    Reflexes are 1+ and symmetric at the biceps, triceps, brachioradialis, patella and achilles.   Hoffman's is absent.   Bilateral upper and lower extremity sensation is intact to light touch.    No evidence of dysmetria noted.  Gait is normal.     Medical Decision Making  Imaging: MRI C spine 08/20/2024 Degenerative changes of the cervical spine most pronounced at C5-6 and C6-7.  Multilevel potentially significant foraminal stenoses are most pronounced on the left at C3-4 and on the right at C5-6 where there are moderate to severe stenoses.  I have personally reviewed the images and agree with the above interpretation.  Assessment and Plan: Joe Patterson is a pleasant 48 y.o. male with cervical radiculopathy due to neuroforaminal stenosis at C3-4 and C5-6.  I think he may have left-sided cubital tunnel syndrome.  He has bilateral radicular symptoms.  He has tried and failed conservative management for cervical radiculopathy.  I recommended surgical intervention.  I would like to review his MRI scan with one of the neuroradiologist.  I recommended either C3-4 and C5-6 arthroplasty versus C3-4 and C5-7 anterior cervical  discectomy and fusion.  I will ask my colleague in neurology to move up his nerve conduction study.  I discussed the planned procedure at length with the patient, including the risks, benefits, alternatives, and indications. The risks discussed include but are not limited to bleeding, infection, need for reoperation, spinal fluid leak, stroke, vision loss, anesthetic complication, coma, paralysis, and even death. We also discussed the possibility of post-operative dysphagia, vocal cord paralysis, and the risk of adjacent segment disease in the future. I also described in detail that improvement was not guaranteed.  The patient expressed understanding of these risks, and asked that we proceed with surgery. I described the surgery in layman's terms, and gave ample opportunity for questions, which were answered to the best of my ability.       Thank you for involving me in the care of this patient.      Jacci Ruberg K. Clois MD, [facility] Neurosurgery

## 2024-09-12 ENCOUNTER — Other Ambulatory Visit: Payer: Self-pay

## 2024-09-12 ENCOUNTER — Ambulatory Visit: Admitting: Neurosurgery

## 2024-09-12 ENCOUNTER — Encounter: Payer: Self-pay | Admitting: Neurosurgery

## 2024-09-12 VITALS — BP 152/108 | Ht 71.0 in | Wt 196.0 lb

## 2024-09-12 DIAGNOSIS — M4802 Spinal stenosis, cervical region: Secondary | ICD-10-CM | POA: Diagnosis not present

## 2024-09-12 DIAGNOSIS — M5412 Radiculopathy, cervical region: Secondary | ICD-10-CM

## 2024-09-12 DIAGNOSIS — Z01818 Encounter for other preprocedural examination: Secondary | ICD-10-CM

## 2024-09-12 NOTE — Patient Instructions (Signed)
 Please see below for information in regards to your upcoming surgery:   Planned surgery: C3-4, C5-6 Arthroplasty   Surgery date: 10/09/2024 at [facility] (Medical Mall: [address] [place], [place] [address]) - you will find out your arrival time the business day before your surgery.   Pre-op appointment at [facility] Pre-admit Testing: you will receive a call with a date/time for this appointment. If you are scheduled for an in person appointment, Pre-admit Testing is located on the first floor of the [facility] building, [address], [address]. During this appointment, they will advise you which medications you can take the morning of surgery, and which medications you will need to hold for surgery. Labs (such as blood work, EKG) may be done at your pre-op appointment. You are not required to fast for these labs. Should you need to change your pre-op appointment, please call Pre-admit testing at [phone].       Common restrictions after spine surgery: No bending, lifting, or twisting ("BLT"). Avoid lifting objects heavier than 10 pounds for the first 6 weeks after surgery. Where possible, avoid household activities that involve lifting, bending, reaching, pushing, or pulling such as laundry, vacuuming, grocery shopping, and childcare. Try to arrange for help from friends and family for these activities while you heal. Do not drive while taking prescription pain medication. Weeks 6 through 12 after surgery: avoid lifting more than 25 pounds.    X-rays after surgery: Because you are having a fusion or arthroplasty: for appointments after your 2 week follow-up: please arrive at the [facility] outpatient imaging center ([address], [place]) or [facility] one hour prior to your appointment for x-rays. This applies to every appointment after your 2 week follow-up. Failure to do so may result in your  appointment being rescheduled. *We recently started construction to have x-ray in our office. This may be completed by the time you come in for your 6 week post-op appointment. Please check with us  closer to that time to see if you can have your x-rays at our office*    How to contact us :  If you have any questions/concerns before or after surgery, you can reach us  at [phone], or you can send a mychart message. We can be reached by phone or mychart 8am-4pm, Monday-Friday.  *Please note: Calls after 4pm are forwarded to a third party answering service. Mychart messages are not routinely monitored during evenings, weekends, and holidays. Please call our office to contact the answering service for urgent concerns during non-business hours.   If you have FMLA/disability paperwork, please drop it off or fax it to [phone]   Appointments/FMLA & disability paperwork: Reche [employer] Ritta Registered Nurse/Surgery scheduler: Kendelyn, RN Certified Medical Assistants: Don, CMA, Elenor, CMA, & Damien, CMA Physician Assistants: Lyle Decamp, PA-C, Edsel Goods, PA-C & Glade Boys, PA-C Surgeons: Penne Sharps, MD & Reeves Daisy, MD   [facility] PREADMIT TESTING VISIT and SURGERY INFORMATION SHEET   Now that surgery has been scheduled you can anticipate several phone calls from [facility] services. A pharmacy technician will call you to verify your current list of medications taken at home.               The Pre-Service Center will call to verify your insurance information and to give you billing estimates and information.             The Preadmit Testing Office will  be calling to schedule a visit to obtain information for the anesthesia team and provide instructions on preparation for surgery.  What can you expect for the Preadmit Testing Visit: Appointments may be scheduled in-person or by telephone.  If a telephone visit is scheduled, you may be asked to come  into the office to have lab tests or other studies performed.   This visit will not be completed any greater than 14 days prior to your surgery.  If your surgery has been scheduled for a future date, please do not be alarmed if we have not contacted you to schedule an appointment more than a month prior to the surgery date.    Please be prepared to provide the following information during this appointment:            -Personal medical history                                               -Medication and allergy list            -Any history of problems with anesthesia              -Recent lab work or diagnostic studies            -Please notify us  of any needs we should be aware of to provide the best care possible           -You will be provided with instructions on how to prepare for your surgery.    On The Day of Surgery:  You must have a driver to take you home after surgery, you will be asked not to drive for 24 hours following surgery.  Taxi, Gisele and non-medical transport will not be acceptable means of transportation unless you have a responsible individual who will be traveling with you.  Visitors in the surgical area:   2 people will be able to visit you in your room once your preparation for surgery has been completed. During surgery, your visitors will be asked to wait in the Surgery Waiting Area.  It is not a requirement for them to stay, if they prefer to leave and come back.  Your visitor(s) will be given an update once the surgery has been completed.  No visitors are allowed in the initial recovery room to respect patient privacy and safety.  Once you are more awake and transfer to the secondary recovery area, or are transferred to an inpatient room, visitors will again be able to see you.  To respect and protect your privacy: We will ask on the day of surgery who your driver will be and what the contact number for that individual will be. We will ask if it is okay to share  information with this individual, or if there is an alternative individual that we, or the surgeon, should contact to provide updates and information. If family or friends come to the surgical information desk requesting information about you, who you have not listed with us , no information will be given.   It may be helpful to designate someone as the main contact who will be responsible for updating your other friends and family.    PREADMIT TESTING OFFICE: [phone] SAME DAY SURGERY: [id number] We look forward to caring for you before and throughout the process of your surgery.

## 2024-09-17 ENCOUNTER — Telehealth: Payer: Self-pay

## 2024-09-17 ENCOUNTER — Telehealth: Payer: Self-pay | Admitting: Neurosurgery

## 2024-09-17 NOTE — Telephone Encounter (Addendum)
 I notified the patient that Dr Clois has a peer to peer scheduled with [employer]'s medical director on 10/9 to discuss this. They are supposed to call him sometime between 12pm-5pm.  He asked that we let him know the outcome.

## 2024-09-17 NOTE — Telephone Encounter (Signed)
-----   Message from [facility] sent at 09/16/2024  1:00 PM EDT ----- See below - stay with C3-4 and C5-6 as a plan ----- Message ----- From: Merriam Ryan FALCON, MD Sent: 09/14/2024   2:11 PM EDT To: Reeves Daisy, MD  I wouldn't even call that much, but the right eccentric disc bulge at C5-6 looks like it's causing mild deformity of the right ventral cord which could get the ventral rootlets of the right C6 nerve, if you think that could explain his symptoms. 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 ----- Message ----- From: Daisy Reeves, MD Sent: 09/12/2024   4:21 PM EDT To: Ryan FALCON Merriam, MD  Hey-  Specific question about C6/7 level.  He has some C7 type symptoms though they are not the most prominent.  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  I was interested in your thoughts.  Thanks, [employer]

## 2024-09-17 NOTE — Telephone Encounter (Signed)
 Patient called to let our office know that he could see his surgery was denied and would like to know what the next step is. Please advise.

## 2024-09-19 ENCOUNTER — Telehealth: Payer: Self-pay

## 2024-09-19 NOTE — Telephone Encounter (Addendum)
 Planned surgery: C3-4 arthroplasty, C5-6 anterior cervical discectomy and fusion   Surgery date: 10/09/24 at [facility]: [address], [place], [place] [address]) - you will find out your arrival time the business day before your surgery.   Pre-op appointment at [facility] Pre-admit Testing: you will receive a call with a date/time for this appointment. If you are scheduled for an in person appointment, Pre-admit Testing is located on the first floor of the [facility] building, [address], [address]. During this appointment, they will advise you which medications you can take the morning of surgery, and which medications you will need to hold for surgery. Labs (such as blood work, EKG) may be done at your pre-op appointment. You are not required to fast for these labs. Should you need to change your pre-op appointment, please call Pre-admit testing at [phone].     NSAIDS (Non-steroidal anti-inflammatory drugs): because you are having a fusion, please avoid taking any NSAIDS (examples: ibuprofen, motrin, aleve, naproxen, meloxicam , diclofenac) for 3 months after surgery. Celebrex  is an exception and is OK to take, if prescribed. Tylenol  is not an NSAID.    Common restrictions after spine surgery: No bending, lifting, or twisting ("BLT"). Avoid lifting objects heavier than 10 pounds for the first 6 weeks after surgery. Where possible, avoid household activities that involve lifting, bending, reaching, pushing, or pulling such as laundry, vacuuming, grocery shopping, and childcare. Try to arrange for help from friends and family for these activities while you heal. Do not drive while taking prescription pain medication. Weeks 6 through 12 after surgery: avoid lifting more than 25 pounds.    X-rays after surgery: Because you are having a fusion or arthroplasty: for appointments after your 2 week follow-up: please arrive our office 30  minutes prior to your appointment for x-rays. This applies to every appointment after your 2 week follow-up. Failure to do so may result in your appointment being rescheduled.    How to contact us :  If you have any questions/concerns before or after surgery, you can reach us  at [phone], or you can send a mychart message. We can be reached by phone or mychart 8am-4pm, Monday-Friday.  *Please note: Calls after 4pm are forwarded to a third party answering service. Mychart messages are not routinely monitored during evenings, weekends, and holidays. Please call our office to contact the answering service for urgent concerns during non-business hours.    If you have FMLA/disability paperwork, please drop it off or fax it to [phone]   Appointments/FMLA & disability paperwork: Reche Hait, & Nichole Registered Nurse/Surgery scheduler: Dannon Perlow, RN & Katie, RN Certified Medical Assistants: Don, CMA, Elenor, CMA, Damien, CMA, & Auston, [place] Physician Assistants: Lyle Decamp, PA-C, Edsel Goods, PA-C & Glade Boys, PA-C Surgeons: Penne Sharps, MD & Reeves Daisy, MD   [facility] PREADMIT TESTING VISIT and SURGERY INFORMATION SHEET   Now that surgery has been scheduled you can anticipate several phone calls from [facility] services. A pharmacy technician will call you to verify your current list of medications taken at home.               The Pre-Service Center will call to verify your insurance information and to give you billing estimates and information.             The Preadmit Testing Office will be calling to schedule a visit to obtain information for the anesthesia team and provide instructions on preparation  for surgery.  What can you expect for the Preadmit Testing Visit: Appointments may be scheduled in-person or by telephone.  If a telephone visit is scheduled, you may be asked to come into the office to have lab tests or other studies  performed.   This visit will not be completed any greater than 14 days prior to your surgery.  If your surgery has been scheduled for a future date, please do not be alarmed if we have not contacted you to schedule an appointment more than a month prior to the surgery date.    Please be prepared to provide the following information during this appointment:            -Personal medical history                                               -Medication and allergy list            -Any history of problems with anesthesia              -Recent lab work or diagnostic studies            -Please notify us  of any needs we should be aware of to provide the best care possible           -You will be provided with instructions on how to prepare for your surgery.    On The Day of Surgery:  You must have a driver to take you home after surgery, you will be asked not to drive for 24 hours following surgery.  Taxi, Gisele and non-medical transport will not be acceptable means of transportation unless you have a responsible individual who will be traveling with you.  Visitors in the surgical area:   2 people will be able to visit you in your room once your preparation for surgery has been completed. During surgery, your visitors will be asked to wait in the Surgery Waiting Area.  It is not a requirement for them to stay, if they prefer to leave and come back.  Your visitor(s) will be given an update once the surgery has been completed.  No visitors are allowed in the initial recovery room to respect patient privacy and safety.  Once you are more awake and transfer to the secondary recovery area, or are transferred to an inpatient room, visitors will again be able to see you.  To respect and protect your privacy: We will ask on the day of surgery who your driver will be and what the contact number for that individual will be. We will ask if it is okay to share information with this individual, or if there is an  alternative individual that we, or the surgeon, should contact to provide updates and information. If family or friends come to the surgical information desk requesting information about you, who you have not listed with us , no information will be given.   It may be helpful to designate someone as the main contact who will be responsible for updating your other friends and family.    PREADMIT TESTING OFFICE: [phone] SAME DAY SURGERY: [phone] We look forward to caring for you before and throughout the process of your surgery.

## 2024-09-19 NOTE — Telephone Encounter (Addendum)
 Dr Clois completed the peer to peer and the denial was upheld. Per Dr Clois, there are 3 options: one is to do one level arthroplasty and then do the next level in a few months. Second is to do two one level fusions. Third is to do one level arthroplasty and one level fusion.  I left a detailed message on Joe Patterson's personal voicemail to call me back to discuss how he would like to proceed. I also sent him a [employer].

## 2024-09-19 NOTE — Telephone Encounter (Addendum)
 I spoke with Joe Patterson. He has decided to proceed with the plan for one level arthroplasty and one level fusion.  I confirmed with Dr Clois that the new plan will be:  C3-4 arthroplasty with Nuvasive Simplify C5-6 ACDF with Globus Forge.  I have notified the reps, changed the surgery posting, and submitted a new authorization request with [employer].

## 2024-09-19 NOTE — Addendum Note (Signed)
 Addended by: Damel Querry on: 09/19/2024 03:06 PM   Modules accepted: Orders

## 2024-09-27 ENCOUNTER — Ambulatory Visit: Admitting: Neurology

## 2024-09-27 DIAGNOSIS — R202 Paresthesia of skin: Secondary | ICD-10-CM

## 2024-09-27 DIAGNOSIS — M5412 Radiculopathy, cervical region: Secondary | ICD-10-CM

## 2024-09-27 NOTE — Procedures (Signed)
 [facility]  [address], [address]  [place], [place] [address] Tel: [phone] Fax: [phone] Test Date:  09/27/2024  Patient: Joe Patterson DOB: November 14, 1976 Physician: Tonita Blanch, DO  Sex: Male Height: 5' 11 Ref Phys: Glade Boys, DEVONNA  ID#: [id number]   Technician:    History: This is a 48 year old man referred for evaluation of bilateral arm paresthesias and pain.  NCV & EMG Findings: Extensive electrodiagnostic testing of the right upper extremity and additional studies of the left shows:  Left median sensory response shows mildly prolonged latency (R3.5 ms).  Right median, right mixed palmar, and bilateral ulnar sensory responses are within normal limits.   Bilateral median and ulnar motor responses are within normal limits.   Chronic motor axon loss changes are seen affecting the C5-C6 myotomes bilaterally, without accompanying active denervation.  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   Left median neuropathy at or distal to the wrist, consistent with a clinical diagnosis of carpal tunnel syndrome.  Overall, these findings are mild in degree electrically.    ___________________________ Tonita Blanch, DO    Nerve Conduction Studies   Stim Site NR Peak (ms) Norm Peak (ms) O-P Amp (V) Norm O-P Amp  Left Median Anti Sensory (2nd Digit)  32 C  Wrist    *3.5 <3.4 26.1 >20  Right Median Anti Sensory (2nd Digit)  32 C  Wrist    3.0 <3.4 26.5 >20  Left Ulnar Anti Sensory (5th Digit)  32 C  Wrist    3.0 <3.1 22.3 >12  Right Ulnar Anti Sensory (5th Digit)  32 C  Wrist    2.8 <3.1 23.2 >12     Stim Site NR Onset (ms) Norm Onset (ms) O-P Amp (mV) Norm O-P Amp Site1 Site2 Delta-0 (ms) Dist (cm) Vel (m/s) Norm Vel (m/s)  Left Median Motor (Abd Poll Brev)  32 C  Wrist    3.5 <3.9 10.5 >6 Elbow Wrist 6.0 35.0 58 >50  Elbow    9.5  10.3         Right Median Motor (Abd Poll Brev)  32 C  Wrist    3.5 <3.9  11.9 >6 Elbow Wrist 5.9 35.0 59 >50  Elbow    9.4  11.3         Left Ulnar Motor (Abd Dig Minimi)  32 C  Wrist    2.3 <3.1 10.0 >7 B Elbow Wrist 4.4 24.0 55 >50  B Elbow    6.7  9.2  A Elbow B Elbow 2.0 10.0 50 >50  A Elbow    8.7  9.1         Right Ulnar Motor (Abd Dig Minimi)  32 C  Wrist    2.5 <3.1 11.7 >7 B Elbow Wrist 4.1 24.0 59 >50  B Elbow    6.6  11.4  A Elbow B Elbow 1.8 10.0 56 >50  A Elbow    8.4  11.4            Stim Site NR Peak (ms) Norm Peak (ms) P-T Amp (V) Site1 Site2 Delta-P (ms) Norm Delta (ms)  Right Median/Ulnar Palm Comparison (Wrist - 8cm)  32 C  Median Palm    1.7 <2.2 47.5 Median Palm Ulnar Palm 0.3   Ulnar Palm    1.4 <2.2 17.5       Electromyography   Side Muscle Ins.Act Fibs Fasc Recrt Amp Dur Poly Activation Comment  Right 1stDorInt Nml Nml  Nml Nml Nml Nml Nml Nml N/A  Right Abd Poll Brev Nml Nml Nml Nml Nml Nml Nml Nml N/A  Right PronatorTeres Nml Nml Nml Nml Nml Nml Nml Nml N/A  Right Biceps Nml Nml Nml *1- *1+ *1+ *1+ Nml N/A  Right Triceps Nml Nml Nml Nml Nml Nml Nml Nml N/A  Right Deltoid Nml Nml Nml *1- *1+ *1+ *1+ Nml N/A  Left 1stDorInt Nml Nml Nml Nml Nml Nml Nml Nml N/A  Left PronatorTeres Nml Nml Nml Nml Nml Nml Nml Nml N/A  Left Abd Poll Brev Nml Nml Nml Nml Nml Nml Nml Nml N/A  Left Biceps Nml Nml Nml *1- *1+ *1+ *1+ Nml N/A  Left Triceps Nml Nml Nml Nml Nml Nml Nml Nml N/A  Left Deltoid Nml Nml Nml *1- *1+ *1+ *1+ Nml N/A  Left Cervical Parasp Low Nml Nml Nml *- *- *- *- Nml N/A      Waveforms:

## 2024-10-03 ENCOUNTER — Other Ambulatory Visit: Payer: Self-pay

## 2024-10-03 ENCOUNTER — Ambulatory Visit
Admission: RE | Admit: 2024-10-03 | Discharge: 2024-10-03 | Disposition: A | Discharge status: Home / Self Care | Source: Ambulatory Visit | Attending: Family Medicine | Admitting: Family Medicine

## 2024-10-03 VITALS — BP 125/90 | HR 80 | Resp 16 | Ht 70.0 in | Wt 199.0 lb

## 2024-10-03 DIAGNOSIS — E782 Mixed hyperlipidemia: Secondary | ICD-10-CM

## 2024-10-03 DIAGNOSIS — Z01818 Encounter for other preprocedural examination: Secondary | ICD-10-CM | POA: Diagnosis not present

## 2024-10-03 DIAGNOSIS — E781 Pure hyperglyceridemia: Secondary | ICD-10-CM | POA: Diagnosis not present

## 2024-10-03 HISTORY — DX: Family history of other specified conditions: Z84.89

## 2024-10-03 LAB — CBC
HCT: 35.7 % — ABNORMAL LOW (ref 39.0–52.0)
Hemoglobin: 12.2 g/dL — ABNORMAL LOW (ref 13.0–17.0)
MCH: 29.8 pg (ref 26.0–34.0)
MCHC: 34.2 g/dL (ref 30.0–36.0)
MCV: 87.1 fL (ref 80.0–100.0)
Platelets: 244 K/uL (ref 150–400)
RBC: 4.1 MIL/uL — ABNORMAL LOW (ref 4.22–5.81)
RDW: 12.1 % (ref 11.5–15.5)
WBC: 5.1 K/uL (ref 4.0–10.5)
nRBC: 0 % (ref 0.0–0.2)

## 2024-10-03 LAB — TYPE AND SCREEN
ABO/RH(D): A POS
Antibody Screen: NEGATIVE

## 2024-10-03 LAB — SURGICAL PCR SCREEN
MRSA, PCR: NEGATIVE
Staphylococcus aureus: NEGATIVE

## 2024-10-03 NOTE — Patient Instructions (Addendum)
 Your procedure is scheduled on: Wednesday 10/09/24 Report to the Registration Desk on the 1st floor of the [facility]. To find out your arrival time, please call [phone] between 1PM - 3PM on: Tuesday 10/08/24 If your arrival time is 6:00 am, do not arrive before that time as the [facility] entrance doors do not open until 6:00 am.  REMEMBER: Instructions that are not followed completely may result in serious medical risk, up to and including death; or upon the discretion of your surgeon and anesthesiologist your surgery may need to be rescheduled.  Do not eat food after midnight the night before surgery.  No gum chewing or hard candies.  You may however, drink CLEAR liquids up to 2 hours before you are scheduled to arrive for your surgery. Do not drink anything within 2 hours of your scheduled arrival time.  Clear liquids include: - water  - apple juice without pulp - gatorade (not RED colors) - black coffee or tea (Do NOT add milk or creamers to the coffee or tea) Do NOT drink anything that is not on this list.  **Type 1 and Type 2 diabetics should only drink water.**  One week prior to surgery: Stop Anti-inflammatories (NSAIDS) such as Advil, Aleve, Ibuprofen, Motrin, Naproxen, Naprosyn and Aspirin based products such as Excedrin, Goody's Powder, BC Powder. You may continue taking your Celebrex .  You may however, continue to take Tylenol  if needed for pain up until the day of surgery.  Stop ANY OVER THE COUNTER supplements and vitamins until after surgery.  Continue taking all of your other prescription medications up until the day of surgery.  ON THE DAY OF SURGERY ONLY TAKE THESE MEDICATIONS WITH SIPS OF WATER:  sertraline  (ZOLOFT ) 25 MG tablet  pregabalin  (LYRICA ) 50 MG capsule  pantoprazole  (PROTONIX ) 40 MG tablet  cetirizine  (ZYRTEC ) 10 MG tablet  atorvastatin  (LIPITOR) 20 MG tablet   No Alcohol for 24 hours before or after surgery.  No Smoking including  e-cigarettes for 24 hours before surgery.  No chewable tobacco products for at least 6 hours before surgery.  No nicotine patches on the day of surgery.  Do not use any recreational drugs for at least a week (preferably 2 weeks) before your surgery.  Please be advised that the combination of cocaine and anesthesia may have negative outcomes, up to and including death. If you test positive for cocaine, your surgery will be cancelled.  On the morning of surgery brush your teeth with toothpaste and water, you may rinse your mouth with mouthwash if you wish. Do not swallow any toothpaste or mouthwash.  Use CHG Soap or wipes as directed on instruction sheet.  Do not shave body hair from the neck down 48 hours before surgery.  Do not wear lotions, powders, deodorants, cologne or perfumes.   Do not wear jewelry, make-up, hairpins, clips or nail polish.  For welded (permanent) jewelry: bracelets, anklets, waist bands, etc.  Please have this removed prior to surgery.  If it is not removed, there is a chance that hospital personnel will need to cut it off on the day of surgery.  Wear clean comfortable clothing (specific to your surgery type) to the hospital.  Contact lenses, hearing aids and dentures may not be worn into surgery. Bring a case for your glasses.  Do not bring valuables to the hospital. [facility] is not responsible for any missing/lost belongings or valuables.   Notify your doctor if there is any change in your medical condition (  cold, fever, infection, rash).  After surgery, you can help prevent lung complications by doing breathing exercises. (Unless instructed otherwise after surgeyr) Take deep breaths and cough every 1-2 hours. Your doctor may order a device called an Incentive Spirometer to help you take deep breaths.  If you are being discharged the day of surgery, you will not be allowed to drive home. You will need a responsible individual to drive you home and stay  with you for 24 hours after surgery.   If you are taking public transportation, you will need to have a responsible individual with you.  Please call the Pre-admissions Testing Dept. at [phone] if you have any questions about these instructions.  Surgery Visitation Policy:  Patients having surgery or a procedure may have two visitors.  Children under the age of 13 must have an adult with them who is not the patient.   Merchandiser, retail to address health-related social needs:  [email or web address]    Pre-operative 4 CHG Bath Instructions   You can play a key role in reducing the risk of infection after surgery. Your skin needs to be as free of germs as possible. You can reduce the number of germs on your skin by washing with CHG (chlorhexidine gluconate) soap before surgery. CHG is an antiseptic soap that kills germs and continues to kill germs even after washing.   DO NOT use if you have an allergy to chlorhexidine/CHG or antibacterial soaps. If your skin becomes reddened or irritated, stop using the CHG and notify one of our RNs at [phone].   Please shower with the CHG soap starting 4 days before surgery using the following schedule:   Saturday 10/05/24 - Tuesday 10/08/24    Please keep in mind the following:  DO NOT shave, including legs and underarms, starting the day of your first shower.   You may shave your face at any point before/day of surgery.  Place clean sheets on your bed the day you start using CHG soap. Use a clean washcloth (not used since being washed) for each shower. DO NOT sleep with pets once you start using the CHG.   CHG Shower Instructions:  If you choose to wash your hair and private area, wash first with your normal shampoo/soap.  After you use shampoo/soap, rinse your hair and body thoroughly to remove shampoo/soap residue.  Turn the water OFF and apply about 3 tablespoons (45 ml) of CHG soap to a CLEAN washcloth.   Apply CHG soap ONLY FROM YOUR NECK DOWN TO YOUR TOES (washing for 3-5 minutes)  DO NOT use CHG soap on face, private areas, open wounds, or sores.  Pay special attention to the area where your surgery is being performed.  If you are having back surgery, having someone wash your back for you may be helpful. Wait 2 minutes after CHG soap is applied, then you may rinse off the CHG soap.  Pat dry with a clean towel  Put on clean clothes/pajamas   If you choose to wear lotion, please use ONLY the CHG-compatible lotions on the back of this paper.     Additional instructions for the day of surgery: DO NOT APPLY any lotions, deodorants, cologne, or perfumes.   Put on clean/comfortable clothes.  Brush your teeth.  Ask your nurse before applying any prescription medications to the skin.      CHG Compatible Lotions   Aveeno Moisturizing lotion  Cetaphil Moisturizing Cream  Cetaphil Moisturizing Lotion  Clairol Herbal Essence Moisturizing  Lotion, Dry Skin  Clairol Herbal Essence Moisturizing Lotion, Extra Dry Skin  Clairol Herbal Essence Moisturizing Lotion, Normal Skin  Curel Age Defying Therapeutic Moisturizing Lotion with Alpha Hydroxy  Curel Extreme Care Body Lotion  Curel Soothing Hands Moisturizing Hand Lotion  Curel Therapeutic Moisturizing Cream, Fragrance-Free  Curel Therapeutic Moisturizing Lotion, Fragrance-Free  Curel Therapeutic Moisturizing Lotion, Original Formula  Eucerin Daily Replenishing Lotion  Eucerin Dry Skin Therapy Plus Alpha Hydroxy Crme  Eucerin Dry Skin Therapy Plus Alpha Hydroxy Lotion  Eucerin Original Crme  Eucerin Original Lotion  Eucerin Plus Crme Eucerin Plus Lotion  Eucerin TriLipid Replenishing Lotion  Keri Anti-Bacterial Hand Lotion  Keri Deep Conditioning Original Lotion Dry Skin Formula Softly Scented  Keri Deep Conditioning Original Lotion, Fragrance Free Sensitive Skin Formula  Keri Lotion Fast Absorbing Fragrance Free Sensitive Skin  Formula  Keri Lotion Fast Absorbing Softly Scented Dry Skin Formula  Keri Original Lotion  Keri Skin Renewal Lotion Keri Silky Smooth Lotion  Keri Silky Smooth Sensitive Skin Lotion  Nivea Body Creamy Conditioning Oil  Nivea Body Extra Enriched Teacher, adult education Moisturizing Lotion Nivea Crme  Nivea Skin Firming Lotion  NutraDerm 30 Skin Lotion  NutraDerm Skin Lotion  NutraDerm Therapeutic Skin Cream  NutraDerm Therapeutic Skin Lotion  ProShield Protective Hand Cream  Provon moisturizing lotion

## 2024-10-03 NOTE — Pre-Procedure Instructions (Signed)
 Patient voices concerns of reaction to CHG soap due to his eczema and sensitive skin. Instructed to test on small area of inner arm. If he has reaction do not use and notify pre admission testing.

## 2024-10-04 NOTE — Progress Notes (Signed)
 Pt called stating that he had used chg soap on small part of skin to see if he would have a reaction to soap due to his Eczema. Tried CHG soap last night and states it broke him out due to his eczema. Pt states he has bought Dial Anti-bacterial soap and will have to use that and is asking if this is ok.  Informed pt that is fine and that I will put CHG soap in as a Intolerance for him.

## 2024-10-09 ENCOUNTER — Encounter: Payer: Self-pay | Admitting: Neurosurgery

## 2024-10-09 ENCOUNTER — Encounter
Admission: RE | Disposition: A | Discharge status: Home / Self Care | Payer: Self-pay | Source: Ambulatory Visit | Attending: Neurosurgery

## 2024-10-09 ENCOUNTER — Ambulatory Visit

## 2024-10-09 ENCOUNTER — Other Ambulatory Visit: Payer: Self-pay

## 2024-10-09 ENCOUNTER — Ambulatory Visit
Admission: RE | Admit: 2024-10-09 | Discharge: 2024-10-09 | Disposition: A | Discharge status: Home / Self Care | Source: Ambulatory Visit | Attending: Neurosurgery | Admitting: Neurosurgery

## 2024-10-09 DIAGNOSIS — Z87891 Personal history of nicotine dependence: Secondary | ICD-10-CM | POA: Diagnosis not present

## 2024-10-09 DIAGNOSIS — F32A Depression, unspecified: Secondary | ICD-10-CM | POA: Insufficient documentation

## 2024-10-09 DIAGNOSIS — F419 Anxiety disorder, unspecified: Secondary | ICD-10-CM | POA: Insufficient documentation

## 2024-10-09 DIAGNOSIS — Z01818 Encounter for other preprocedural examination: Secondary | ICD-10-CM

## 2024-10-09 DIAGNOSIS — K219 Gastro-esophageal reflux disease without esophagitis: Secondary | ICD-10-CM | POA: Insufficient documentation

## 2024-10-09 DIAGNOSIS — M4802 Spinal stenosis, cervical region: Secondary | ICD-10-CM | POA: Diagnosis present

## 2024-10-09 DIAGNOSIS — M5412 Radiculopathy, cervical region: Secondary | ICD-10-CM | POA: Insufficient documentation

## 2024-10-09 DIAGNOSIS — Z79899 Other long term (current) drug therapy: Secondary | ICD-10-CM | POA: Diagnosis not present

## 2024-10-09 DIAGNOSIS — E782 Mixed hyperlipidemia: Secondary | ICD-10-CM | POA: Insufficient documentation

## 2024-10-09 HISTORY — PX: CERVICAL DISC ARTHROPLASTY: SHX587

## 2024-10-09 HISTORY — PX: ANTERIOR CERVICAL DECOMP/DISCECTOMY FUSION: SHX1161

## 2024-10-09 LAB — ABO/RH: ABO/RH(D): A POS

## 2024-10-09 SURGERY — CERVICAL ANTERIOR DISC ARTHROPLASTY
Anesthesia: General

## 2024-10-09 MED ORDER — REMIFENTANIL HCL 1 MG IV SOLR
INTRAVENOUS | Status: AC
  Filled 2024-10-09: qty 1000

## 2024-10-09 MED ORDER — DEXMEDETOMIDINE HCL IN NACL 80 MCG/20ML IV SOLN
INTRAVENOUS | Status: DC | PRN
  Administered 2024-10-09 (×2): 4 ug via INTRAVENOUS

## 2024-10-09 MED ORDER — LACTATED RINGERS IV SOLN: INTRAVENOUS | Status: DC

## 2024-10-09 MED ORDER — FENTANYL CITRATE (PF) 100 MCG/2ML IJ SOLN
INTRAMUSCULAR | Status: AC
  Filled 2024-10-09: qty 2

## 2024-10-09 MED ORDER — KETAMINE HCL 50 MG/5ML IJ SOSY
PREFILLED_SYRINGE | INTRAMUSCULAR | Status: AC
  Filled 2024-10-09: qty 5

## 2024-10-09 MED ORDER — PROPOFOL 500 MG/50ML IV EMUL
INTRAVENOUS | Status: DC | PRN
  Administered 2024-10-09: 100 ug/kg/min via INTRAVENOUS

## 2024-10-09 MED ORDER — CHLORHEXIDINE GLUCONATE 0.12 % MT SOLN
15.0000 mL | Freq: Once | OROMUCOSAL | Status: AC
  Administered 2024-10-09: 15 mL via OROMUCOSAL

## 2024-10-09 MED ORDER — POLYETHYLENE GLYCOL 3350 17 GM/SCOOP PO POWD
17.0000 g | Freq: Every day | ORAL | 0 refills | Status: DC | PRN
  Filled 2024-10-09: qty 238, 14d supply, fill #0

## 2024-10-09 MED ORDER — PROPOFOL 10 MG/ML IV BOLUS
INTRAVENOUS | Status: AC
  Filled 2024-10-09: qty 20

## 2024-10-09 MED ORDER — BUPIVACAINE-EPINEPHRINE (PF) 0.5% -1:200000 IJ SOLN
INTRAMUSCULAR | Status: DC | PRN
  Administered 2024-10-09: 7 mL

## 2024-10-09 MED ORDER — SUCCINYLCHOLINE CHLORIDE 200 MG/10ML IV SOSY
PREFILLED_SYRINGE | INTRAVENOUS | Status: DC | PRN
  Administered 2024-10-09: 100 mg via INTRAVENOUS

## 2024-10-09 MED ORDER — DEXMEDETOMIDINE HCL IN NACL 80 MCG/20ML IV SOLN
INTRAVENOUS | Status: AC
Start: 2024-10-09 — End: 2024-10-09
  Filled 2024-10-09: qty 20

## 2024-10-09 MED ORDER — 0.9 % SODIUM CHLORIDE (POUR BTL) OPTIME
TOPICAL | Status: DC | PRN
  Administered 2024-10-09: 500 mL

## 2024-10-09 MED ORDER — ACETAMINOPHEN 10 MG/ML IV SOLN: 1000.0000 mg | Freq: Once | INTRAVENOUS | Status: DC | PRN

## 2024-10-09 MED ORDER — EPHEDRINE SULFATE-NACL 50-0.9 MG/10ML-% IV SOSY
PREFILLED_SYRINGE | INTRAVENOUS | Status: DC | PRN
  Administered 2024-10-09: 10 mg via INTRAVENOUS

## 2024-10-09 MED ORDER — OXYCODONE HCL 5 MG PO TABS
5.0000 mg | ORAL_TABLET | Freq: Once | ORAL | Status: AC | PRN
  Administered 2024-10-09: 5 mg via ORAL

## 2024-10-09 MED ORDER — MIDAZOLAM HCL 2 MG/2ML IJ SOLN
INTRAMUSCULAR | Status: AC
  Filled 2024-10-09: qty 2

## 2024-10-09 MED ORDER — FENTANYL CITRATE (PF) 100 MCG/2ML IJ SOLN
25.0000 ug | INTRAMUSCULAR | Status: DC | PRN
  Administered 2024-10-09 (×3): 50 ug via INTRAVENOUS

## 2024-10-09 MED ORDER — OXYCODONE HCL 5 MG PO TABS
5.0000 mg | ORAL_TABLET | ORAL | 0 refills | Status: AC | PRN
  Filled 2024-10-09: qty 30, 5d supply, fill #0

## 2024-10-09 MED ORDER — PHENYLEPHRINE HCL-NACL 20-0.9 MG/250ML-% IV SOLN
INTRAVENOUS | Status: AC
  Filled 2024-10-09: qty 250

## 2024-10-09 MED ORDER — CYCLOBENZAPRINE HCL 10 MG PO TABS
10.0000 mg | ORAL_TABLET | Freq: Three times a day (TID) | ORAL | 0 refills | Status: DC | PRN
  Filled 2024-10-09: qty 90, 30d supply, fill #0

## 2024-10-09 MED ORDER — REMIFENTANIL HCL 1 MG IV SOLR
INTRAVENOUS | Status: DC | PRN
  Administered 2024-10-09: .15 ug/kg/min via INTRAVENOUS

## 2024-10-09 MED ORDER — METHYLPREDNISOLONE 4 MG PO TBPK
ORAL_TABLET | ORAL | 0 refills | Status: DC
  Filled 2024-10-09: qty 21, 6d supply, fill #0

## 2024-10-09 MED ORDER — PHENYLEPHRINE 80 MCG/ML (10ML) SYRINGE FOR IV PUSH (FOR BLOOD PRESSURE SUPPORT)
PREFILLED_SYRINGE | INTRAVENOUS | Status: DC | PRN
  Administered 2024-10-09: 80 ug via INTRAVENOUS

## 2024-10-09 MED ORDER — LIDOCAINE HCL (CARDIAC) PF 100 MG/5ML IV SOSY
PREFILLED_SYRINGE | INTRAVENOUS | Status: DC | PRN
  Administered 2024-10-09: 100 mg via INTRAVENOUS

## 2024-10-09 MED ORDER — MIDAZOLAM HCL (PF) 2 MG/2ML IJ SOLN
INTRAMUSCULAR | Status: DC | PRN
  Administered 2024-10-09: 2 mg via INTRAVENOUS

## 2024-10-09 MED ORDER — CHLORHEXIDINE GLUCONATE 0.12 % MT SOLN
OROMUCOSAL | Status: AC
  Filled 2024-10-09: qty 15

## 2024-10-09 MED ORDER — ONDANSETRON HCL 4 MG/2ML IJ SOLN
INTRAMUSCULAR | Status: DC | PRN
Start: 2024-10-09 — End: 2024-10-09
  Administered 2024-10-09: 4 mg via INTRAVENOUS

## 2024-10-09 MED ORDER — ONDANSETRON HCL 4 MG/2ML IJ SOLN: 4.0000 mg | Freq: Once | INTRAMUSCULAR | Status: DC | PRN

## 2024-10-09 MED ORDER — SENNA 8.6 MG PO TABS
1.0000 | ORAL_TABLET | Freq: Two times a day (BID) | ORAL | 0 refills | Status: DC | PRN
  Filled 2024-10-09: qty 30, 15d supply, fill #0

## 2024-10-09 MED ORDER — ACETAMINOPHEN 10 MG/ML IV SOLN
INTRAVENOUS | Status: DC | PRN
  Administered 2024-10-09: 1000 mg via INTRAVENOUS

## 2024-10-09 MED ORDER — CEFAZOLIN SODIUM-DEXTROSE 2-4 GM/100ML-% IV SOLN
2.0000 g | Freq: Once | INTRAVENOUS | Status: AC
  Administered 2024-10-09: 2 g via INTRAVENOUS

## 2024-10-09 MED ORDER — OXYCODONE HCL 5 MG/5ML PO SOLN: 5.0000 mg | Freq: Once | ORAL | Status: AC | PRN

## 2024-10-09 MED ORDER — PROPOFOL 10 MG/ML IV BOLUS
INTRAVENOUS | Status: DC | PRN
  Administered 2024-10-09: 200 mg via INTRAVENOUS

## 2024-10-09 MED ORDER — KETAMINE HCL 50 MG/5ML IJ SOSY
PREFILLED_SYRINGE | INTRAMUSCULAR | Status: DC | PRN
  Administered 2024-10-09: 50 mg via INTRAVENOUS

## 2024-10-09 MED ORDER — OXYCODONE HCL 5 MG PO TABS
ORAL_TABLET | ORAL | Status: AC
  Filled 2024-10-09: qty 1

## 2024-10-09 MED ORDER — PROPOFOL 1000 MG/100ML IV EMUL
INTRAVENOUS | Status: AC
  Filled 2024-10-09: qty 100

## 2024-10-09 MED ORDER — DEXAMETHASONE SOD PHOSPHATE PF 10 MG/ML IJ SOLN
INTRAMUSCULAR | Status: DC | PRN
  Administered 2024-10-09: 10 mg via INTRAVENOUS

## 2024-10-09 MED ORDER — LIDOCAINE HCL (PF) 2 % IJ SOLN
INTRAMUSCULAR | Status: AC
Start: 2024-10-09 — End: 2024-10-09
  Filled 2024-10-09: qty 5

## 2024-10-09 MED ORDER — BUPIVACAINE-EPINEPHRINE (PF) 0.5% -1:200000 IJ SOLN
INTRAMUSCULAR | Status: AC
  Filled 2024-10-09: qty 30

## 2024-10-09 MED ORDER — FENTANYL CITRATE (PF) 100 MCG/2ML IJ SOLN
INTRAMUSCULAR | Status: DC | PRN
  Administered 2024-10-09 (×2): 50 ug via INTRAVENOUS

## 2024-10-09 MED ORDER — GLYCOPYRROLATE 0.2 MG/ML IJ SOLN
INTRAMUSCULAR | Status: DC | PRN
  Administered 2024-10-09: .1 mg via INTRAVENOUS

## 2024-10-09 MED ORDER — ONDANSETRON HCL 4 MG/2ML IJ SOLN
INTRAMUSCULAR | Status: AC
  Filled 2024-10-09: qty 2

## 2024-10-09 MED ORDER — ORAL CARE MOUTH RINSE: 15.0000 mL | Freq: Once | OROMUCOSAL | Status: AC

## 2024-10-09 MED ORDER — CEFAZOLIN SODIUM-DEXTROSE 2-4 GM/100ML-% IV SOLN
INTRAVENOUS | Status: AC
  Filled 2024-10-09: qty 100

## 2024-10-09 MED ORDER — SURGIFLO WITH THROMBIN (HEMOSTATIC MATRIX KIT) OPTIME
TOPICAL | Status: DC | PRN
  Administered 2024-10-09: 1 via TOPICAL

## 2024-10-09 SURGICAL SUPPLY — 40 items
BASIN KIT SINGLE STR (MISCELLANEOUS) ×1 IMPLANT
BUR NEURO DRILL SOFT 3.0X3.8M (BURR) ×1 IMPLANT
DERMABOND ADVANCED .7 DNX12 (GAUZE/BANDAGES/DRESSINGS) ×1 IMPLANT
DISC SIMPLIFY SIZE 1 HT 4 (Miscellaneous) IMPLANT
DRAIN CHANNEL JP 10F RND 20C F (MISCELLANEOUS) IMPLANT
DRAPE C ARM PK CFD 31 SPINE (DRAPES) ×1 IMPLANT
DRAPE LAPAROTOMY 77X122 PED (DRAPES) ×1 IMPLANT
DRAPE SPINE LEICA/WILD 54X150 (DRAPES) ×1 IMPLANT
DRSG TEGADERM 4X4.75 (GAUZE/BANDAGES/DRESSINGS) IMPLANT
ELECTRODE REM PT RTRN 9FT ADLT (ELECTROSURGICAL) ×1 IMPLANT
EVACUATOR SILICONE 100CC (DRAIN) IMPLANT
FEE INTRAOP CADWELL SUPPLY NCS (MISCELLANEOUS) IMPLANT
FEE INTRAOP MONITOR IMPULS NCS (MISCELLANEOUS) IMPLANT
GAUZE SPONGE 2X2 STRL 8-PLY (GAUZE/BANDAGES/DRESSINGS) IMPLANT
GLOVE BIOGEL PI IND STRL 6.5 (GLOVE) ×1 IMPLANT
GLOVE SURG SYN 6.5 PF PI (GLOVE) ×1 IMPLANT
GLOVE SURG SYN 8.5 PF PI (GLOVE) ×3 IMPLANT
GOWN SRG LRG LVL 4 IMPRV REINF (GOWNS) ×1 IMPLANT
GOWN SRG XL LVL 3 NONREINFORCE (GOWNS) ×1 IMPLANT
KIT TURNOVER KIT A (KITS) ×1 IMPLANT
MANIFOLD NEPTUNE II (INSTRUMENTS) ×1 IMPLANT
MARKER SKIN DUAL TIP RULER LAB (MISCELLANEOUS) ×2 IMPLANT
NS IRRIG 500ML POUR BTL (IV SOLUTION) ×1 IMPLANT
PACK LAMINECTOMY ARMC (PACKS) ×1 IMPLANT
PAD ARMBOARD POSITIONER FOAM (MISCELLANEOUS) ×2 IMPLANT
PIN CASPAR 14 (PIN) ×1 IMPLANT
PLATE ACP 1.6 18 (Plate) IMPLANT
SCREW ACP 3.5X17 S/D VARIA (Screw) IMPLANT
SPACER CERVICAL FRGE 12X14X8-7 (Spacer) IMPLANT
SPONGE KITTNER 5P (MISCELLANEOUS) ×1 IMPLANT
STAPLER SKIN PROX 35W (STAPLE) IMPLANT
SURGIFLO W/THROMBIN 8M KIT (HEMOSTASIS) ×1 IMPLANT
SUT STRATA 3-0 15 PS-2 (SUTURE) ×1 IMPLANT
SUT VIC AB 3-0 SH 8-18 (SUTURE) ×1 IMPLANT
SUT VICRYL 3-0 CR8 SH (SUTURE) ×1 IMPLANT
SUTURE EHLN 3-0 FS-10 30 BLK (SUTURE) IMPLANT
SYR 20ML LL LF (SYRINGE) ×1 IMPLANT
TAPE CLOTH 3X10 WHT NS LF (GAUZE/BANDAGES/DRESSINGS) ×2 IMPLANT
TRAP FLUID SMOKE EVACUATOR (MISCELLANEOUS) ×1 IMPLANT
TRAY FOLEY SLVR 16FR LF STAT (SET/KITS/TRAYS/PACK) IMPLANT

## 2024-10-09 NOTE — Discharge Instructions (Addendum)
 Your surgeon has performed an operation on your cervical spine (neck) to relieve pressure on the spinal cord and/or nerves. This involved making an incision in the front of your neck and removing one or more of the discs that support your spine. Next, a small piece of bone, a titanium plate, and screws were used to fuse two or more of the vertebrae (bones) together.  The following are instructions to help in your recovery once you have been discharged from the hospital. Even if you feel well, it is important that you follow these activity guidelines. If you do not let your neck heal properly from the surgery, you can increase the chance of return of your symptoms and other complications.  * Do not take anti-inflammatory medications for 3 months after surgery (naproxen [Aleve], ibuprofen [Advil, Motrin], etc.). These medications can prevent your bones from healing properly.  Celebrex , if prescribed, is ok to take after completion of steroid taper  *Regarding compression stockings-  Please wear day and night until you are walking a couple hundred feet three times a day.   Activity    No bending, lifting, or twisting ("BLT"). Avoid lifting objects heavier than 10 pounds (gallon milk jug).  Where possible, avoid household activities that involve lifting, bending, reaching, pushing, or pulling such as laundry, vacuuming, grocery shopping, and childcare. Try to arrange for help from friends and family for these activities while your back heals.  Increase physical activity slowly as tolerated.  Taking short walks is encouraged, but avoid strenuous exercise. Do not jog, run, bicycle, lift weights, or participate in any other exercises unless specifically allowed by your doctor.  Talk to your doctor before resuming sexual activity.  You should not drive until cleared by your doctor.  Until released by your doctor, you should not return to work or school.  You should rest at home and let your body heal.    You may shower three days after your surgery.  After showering, lightly dab your incision dry. Do not take a tub bath or go swimming until approved by your doctor at your follow-up appointment.  If your doctor ordered a cervical collar (neck brace) for you, you should wear it whenever you are out of bed. You may remove it when lying down or sleeping, but you should wear it at all other times. Not all neck surgeries require a cervical collar.  If you smoke, we strongly recommend that you quit.  Smoking has been proven to interfere with normal bone healing and will dramatically reduce the success rate of your surgery. Please contact [facility] (800-QUIT-NOW) and use the resources at www.[facility].com for assistance in stopping smoking.  Surgical Incision   If you have a dressing on your incision, you may remove it two days after your surgery. Keep your incision area clean and dry.  If you have staples or stitches on your incision, you should have a follow up scheduled for removal. If you do not have staples or stitches, you will have steri-strips (small pieces of surgical tape) or Dermabond glue. The steri-strips/glue should begin to peel away within about a week (it is fine if the steri-strips fall off before then). If the strips are still in place one week after your surgery, you may gently remove them.  Diet           You may return to your usual diet. However, you may experience discomfort when swallowing in the first month after your surgery. This is normal. You may  find that softer foods are more comfortable for you to swallow. Be sure to stay hydrated.  When to Contact Us   You may experience pain in your neck and/or pain between your shoulder blades. This is normal and should improve in the next few weeks with the help of pain medication, muscle relaxers, and rest. Some patients report that a warm compress on the back of the neck or between the shoulder blades helps.  However, should  you experience any of the following, contact us  immediately: New numbness or weakness Pain that is progressively getting worse, and is not relieved by your pain medication, muscle relaxers, rest, and warm compresses Bleeding, redness, swelling, pain, or drainage from surgical incision Chills or flu-like symptoms Fever greater than 101.0 F (38.3 C) Inability to eat, drink fluids, or take medications Problems with bowel or bladder functions Difficulty breathing or shortness of breath Warmth, tenderness, or swelling in your calf Contact Information How to contact us :  If you have any questions/concerns before or after surgery, you can reach us  at [phone], or you can send a mychart message. We can be reached by phone or mychart 8am-4pm, Monday-Friday.  *Please note: Calls after 4pm are forwarded to a third party answering service. Mychart messages are not routinely monitored during evenings, weekends, and holidays. Please call our office to contact the answering service for urgent concerns during non-business hours.

## 2024-10-09 NOTE — Anesthesia Preprocedure Evaluation (Addendum)
 Anesthesia Evaluation  Patient identified by MRN, date of birth, ID band Patient awake    Reviewed: Allergy & Precautions, NPO status , Patient's Chart, lab work & pertinent test results  History of Anesthesia Complications Negative for: history of anesthetic complications  Airway Mallampati: IV   Neck ROM: Full    Dental no notable dental hx.    Pulmonary former smoker (quit 2009)   Pulmonary exam normal breath sounds clear to auscultation       Cardiovascular Exercise Tolerance: Good negative cardio ROS Normal cardiovascular exam Rhythm:Regular Rate:Normal  ECG 10/03/24: normal   Neuro/Psych  PSYCHIATRIC DISORDERS Anxiety Depression    negative neurological ROS     GI/Hepatic ,GERD  ,,  Endo/Other  negative endocrine ROS    Renal/GU negative Renal ROS     Musculoskeletal   Abdominal   Peds  Hematology negative hematology ROS (+)   Anesthesia Other Findings   Reproductive/Obstetrics                              Anesthesia Physical Anesthesia Plan  ASA: 2  Anesthesia Plan: General   Post-op Pain Management:    Induction: Intravenous  PONV Risk Score and Plan: 2 and Ondansetron , Dexamethasone  and Treatment may vary due to age or medical condition  Airway Management Planned: Oral ETT  Additional Equipment:   Intra-op Plan:   Post-operative Plan: Extubation in OR  Informed Consent: I have reviewed the patients History and Physical, chart, labs and discussed the procedure including the risks, benefits and alternatives for the proposed anesthesia with the patient or authorized representative who has indicated his/her understanding and acceptance.     Dental advisory given  Plan Discussed with: CRNA  Anesthesia Plan Comments: (Patient consented for risks of anesthesia including but not limited to:  - adverse reactions to medications - damage to eyes, teeth, lips or other  oral mucosa - nerve damage due to positioning  - sore throat or hoarseness - damage to heart, brain, nerves, lungs, other parts of body or loss of life  Informed patient about role of CRNA in peri- and intra-operative care.  Patient voiced understanding.)         Anesthesia Quick Evaluation

## 2024-10-09 NOTE — Progress Notes (Signed)
 Created space for patients girlfriend to share fears and concerns related to the recovery of Kristofer's disc surgery. Offered care, support and listening ear for Becky as she waited for patient/boyfriend to complete surgery.   Naquita Nappier Chaplain Intern

## 2024-10-09 NOTE — Addendum Note (Signed)
 Addendum  created 10/09/24 1420 by Myra Lawless, CRNA   Intraprocedure Meds edited

## 2024-10-09 NOTE — Anesthesia Procedure Notes (Signed)
 Procedure Name: Intubation Date/Time: 10/09/2024 7:24 AM  Performed by: Myra Lawless, CRNAPre-anesthesia Checklist: Patient identified, Patient being monitored, Timeout performed, Emergency Drugs available and Suction available Patient Re-evaluated:Patient Re-evaluated prior to induction Oxygen Delivery Method: Circle system utilized Preoxygenation: Pre-oxygenation with 100% oxygen Induction Type: IV induction Ventilation: Mask ventilation without difficulty Laryngoscope Size: Mac and 4 Grade View: Grade I Tube type: Oral Tube size: 7.5 mm Number of attempts: 1 Airway Equipment and Method: Stylet Placement Confirmation: ETT inserted through vocal cords under direct vision, positive ETCO2 and breath sounds checked- equal and bilateral Secured at: 22 cm Tube secured with: Tape Dental Injury: Teeth and Oropharynx as per pre-operative assessment

## 2024-10-09 NOTE — Interval H&P Note (Signed)
 History and Physical Interval Note:  10/09/2024 7:02 AM  Joe Patterson  has presented today for surgery, with the diagnosis of M54.12 Cervical radiculopathy M48.02 Spinal stenosis in cervical region.  The various methods of treatment have been discussed with the patient and family. After consideration of risks, benefits and other options for treatment, the patient has consented to  Procedure(s) with comments: CERVICAL ANTERIOR DISC ARTHROPLASTY (N/A) - C3-4 ARTHROPLASTY ANTERIOR CERVICAL DECOMPRESSION/DISCECTOMY FUSION 1 LEVEL (N/A) - C5-6 ANTERIOR CERVICAL DISCECTOMY AND FUSION as a surgical intervention.  The patient's history has been reviewed, patient examined, no change in status, stable for surgery.  I have reviewed the patient's chart and labs.  Questions were answered to the patient's satisfaction.    Heart sounds normal no MRG. Chest Clear to Auscultation Bilaterally.   Jamilet Ambroise

## 2024-10-09 NOTE — Anesthesia Postprocedure Evaluation (Signed)
 Anesthesia Post Note  Patient: Joe Patterson  Procedure(s) Performed: CERVICAL ANTERIOR DISC ARTHROPLASTY ANTERIOR CERVICAL DECOMPRESSION/DISCECTOMY FUSION 1 LEVEL  Patient location during evaluation: PACU Anesthesia Type: General Level of consciousness: awake and alert, oriented and patient cooperative Pain management: pain level controlled Vital Signs Assessment: post-procedure vital signs reviewed and stable Respiratory status: spontaneous breathing, nonlabored ventilation and respiratory function stable Cardiovascular status: blood pressure returned to baseline and stable Postop Assessment: adequate PO intake Anesthetic complications: no   No notable events documented.   Last Vitals:  Vitals:   10/09/24 1030 10/09/24 1045  BP: (!) 137/96 (!) 131/95  Pulse: 87 88  Resp: (!) 9 15  Temp:  (!) 36.3 C  SpO2: 95% 97%    Last Pain:  Vitals:   10/09/24 1045  TempSrc:   PainSc: 4                  Sean Macwilliams

## 2024-10-09 NOTE — Op Note (Addendum)
 Indications: [name] is a 48 y.o. male with cervical radiculopathy M54.12.  Findings: left foraminal stenosis C3-4, right foraminal stenosis C5-6  Preoperative Diagnosis: Cervical Radiculopathy M54.12, Cervical Stenosis M48.02 Postoperative Diagnosis: same   EBL: 50 ml IVF: see AR Drains: none Disposition: Extubated and Stable to PACU Complications: none  No foley catheter was placed.   Preoperative Note:   Risks of surgery discussed include: infection, bleeding, stroke, coma, death, paralysis, CSF leak, nerve/spinal cord injury, numbness, tingling, weakness, complex regional pain syndrome, recurrent stenosis and/or disc herniation, vascular injury, development of instability, neck/back pain, need for further surgery, persistent symptoms, development of deformity, and the risks of anesthesia. The patient understood these risks and agreed to proceed.  Operative Note:  Procedure:  1) Cervical Disc Arthroplasty at C3/4 using a Nuvasive Simplify 2) Anterior cervical discectomy and fusion at C5/6   Procedure: After obtaining informed consent, the patient taken to the operating room, placed in supine position, general anesthesia induced.  The patient had a small shoulder roll placed behind the neck.  The patient received preop antibiotics and IV Decadron .  The patient had a neck incision outlined, was prepped and draped in usual sterile fashion. The incision was injected with local anesthetic.   An incision was opened, dissection taken down medial to the carotid artery and jugular vein, lateral to the trachea and esophagus.  The prevertebral fascia identified and a localizing x-ray demonstrated the correct level.  The longus colli were dissected laterally, and self-retaining retractors placed to open the operative field. The microscope was then brought into the field.  With this complete, distractor pins were placed in the vertebral bodies of C3 and C4. The distractor was placed, and  the annulus at C3/4 was opened using a bovie.  Curettes and pituitary rongeurs used to remove the majority of disk, then the drill was used to remove the posterior osteophyte and begin the foraminotomies. The nerve hook was used to elevate the posterior longitudinal ligament, which was then removed with Kerrison rongeurs. The Kerrison was then used to complete the foraminotomies bilaterally. The microblunt nerve hook could be passed out the foramen bilaterally.   Meticulous hemostasis was obtained.  A trial spacer was used to size the disc space. Using flouroscopic guidance, a 4 mm height Nuvasive Simplify was then inserted in the prepared disc space.  The caspar distractor was removed, and bone wax used for hemostasis. Final AP and lateral radiographs were taken.   With the disc arthroplasty in good position, we moved to the ACDF portion at C5/6.   Distractor pins were placed in the vertebral bodies of C5 and C6. The distractor was placed, and the annulus at C5/6 was opened using a bovie.  Curettes and pituitary rongeurs used to remove the majority of disk, then the drill was used to remove the posterior osteophyte, expose the posterior longitudinal ligament, and begin the foraminotomies. The nerve hook was used to elevate the posterior longitudinal ligament, which was then removed with Kerrison rongeurs to complete decompression of the spinal cord. The Kerrison rongeurs were then used to complete the foraminotomies bilaterally to decompress the nerve roots. The nerve hook could be passed out each foramen, ensuring decompression of the nerve roots.  Meticulous hemostasis obtained.  Structural allograft was tapped behind the anterior lip of the vertebral body at C5/6 (8 mm).    Please note that the procedure included removal of the disc, removal of the posterior osteophytes, and removal of the posterior longitudinal ligament to  ensure decompression of the spinal cord.  Additionally, foraminotomies were  performed on both sides of the spinal canal to decompress the nerve roots.  The caspar distractor was removed, and bone wax used for hemostasis. A separate, 18 mm Nuvasive ACP plate was chosen.  Two screws placed in each vertebral body, respectively making sure the screws were behind the locking mechanism.  Final AP and lateral radiographs were taken.   Please note that the plate is not inclusive to the interbody structural allograft.  The anchoring mechanism of the plate is completely separate from the allograft.  With everything in good position, the wound was irrigated copiously and meticulous hemostasis obtained.  Wound was closed in 2 layers using interrupted inverted 3-0 Vicryl sutures.  The wound was dressed with dermabond, the head of bed at 30 degrees, taken to recovery room in stable condition.  No new postop neurological deficits were identified.  Sponge and pattie counts were correct at the end of the procedure.    I performed the entire procedure with Joe Goods PA as an designer, television/film set. An assistant was required for this procedure due to the complexity.  The assistant provided assistance in tissue manipulation and suction, and was required for the successful and safe performance of the procedure. I performed the critical portions of the procedure.   Joe Daisy MD

## 2024-10-09 NOTE — Progress Notes (Signed)
 Pt voided 750 cc, ambulated in hallway, tolerated eating without difficulty, denies any swelling of throat.

## 2024-10-09 NOTE — Transfer of Care (Signed)
 Immediate Anesthesia Transfer of Care Note  Patient: Joe Patterson  Procedure(s) Performed: CERVICAL ANTERIOR DISC ARTHROPLASTY ANTERIOR CERVICAL DECOMPRESSION/DISCECTOMY FUSION 1 LEVEL  Patient Location: PACU  Anesthesia Type:General  Level of Consciousness: awake, alert , oriented, and patient cooperative  Airway & Oxygen Therapy: Patient Spontanous Breathing and Patient connected to face mask oxygen  Post-op Assessment: Report given to RN, Post -op Vital signs reviewed and stable, and Patient moving all extremities X 4  Post vital signs: Reviewed and stable  Last Vitals:  Vitals Value Taken Time  BP 133/90 10/09/24 09:46  Temp    Pulse 85 10/09/24 09:47  Resp 7 10/09/24 09:47  SpO2 100 % 10/09/24 09:47  Vitals shown include unfiled device data.  Last Pain:  Vitals:   10/09/24 0611  TempSrc: Temporal  PainSc: 3          Complications: No notable events documented.

## 2024-10-10 ENCOUNTER — Encounter: Payer: Self-pay | Admitting: Neurosurgery

## 2024-10-11 ENCOUNTER — Telehealth: Payer: Self-pay

## 2024-10-11 DIAGNOSIS — M25561 Pain in right knee: Secondary | ICD-10-CM

## 2024-10-11 NOTE — Telephone Encounter (Signed)
 Copied from CRM [id number]. Topic: Referral - Request for Referral >> Oct 11, 2024  1:29 PM Gustabo D wrote: Did the patient discuss referral with their provider in the last year? Yes he says spoke with pcp about this a few weeks ago (If No - schedule appointment) (If Yes - send message)  Appointment offered? Yes he says spoke with pcp about this a few weeks ago  Type of order/referral and detailed reason for visit: for his knee  Preference of office, provider, location: [facility] in [place]  If referral order, have you been seen by this specialty before? Yes for other things (If Yes, this issue or another issue? When? Where?  Can we respond through MyChart? Yes

## 2024-10-20 NOTE — Progress Notes (Unsigned)
   REFERRING PHYSICIAN:  Leavy Mole, Pa-c [address] [place],  [place] [address]  DOS: 10/09/24  cervical disc arthroplasty C3-C4 and ACDF C5-C6  HISTORY OF PRESENT ILLNESS: Adam Demary DESHLER is 2 weeks status post above surgery. Given flexeril , medrol  dose pack, and oxycodone  on discharge from the hospital.   Preop neck and bilateral arm pain.    PHYSICAL EXAMINATION:  NEUROLOGICAL:  General: In no acute distress.   Awake, alert, oriented to person, place, and time.  Pupils equal round and reactive to light.  Facial tone is symmetric.    Strength: Side Biceps Triceps Deltoid Interossei Grip Wrist Ext. Wrist Flex.  R 5 5 5 5 5 5 5   L 5 5 5 5 5 5 5    Incision c/d/i  Imaging:  Nothing new to review.   Assessment / Plan: MATEUSZ NEILAN is doing well s/p above surgery. Treatment options reviewed with patient and following plan made:   - We discussed activity escalation and I have advised the patient to lift up to 10 pounds until 6 weeks after surgery (until follow up with Dr. Clois).   - Reviewed wound care.  - Continue current medications including *** - Follow up as scheduled in 4 weeks and prn.   Advised to contact the office if any questions or concerns arise.   Glade Boys PA-C Dept of Neurosurgery

## 2024-10-23 ENCOUNTER — Encounter: Admitting: Family Medicine

## 2024-10-24 ENCOUNTER — Ambulatory Visit (INDEPENDENT_AMBULATORY_CARE_PROVIDER_SITE_OTHER): Admitting: Orthopedic Surgery

## 2024-10-24 ENCOUNTER — Encounter: Payer: Self-pay | Admitting: Orthopedic Surgery

## 2024-10-24 VITALS — BP 140/82 | Temp 97.7°F | Ht 70.5 in | Wt 190.1 lb

## 2024-10-24 DIAGNOSIS — Z9889 Other specified postprocedural states: Secondary | ICD-10-CM

## 2024-10-24 DIAGNOSIS — Z981 Arthrodesis status: Secondary | ICD-10-CM

## 2024-10-24 DIAGNOSIS — M5412 Radiculopathy, cervical region: Secondary | ICD-10-CM

## 2024-10-25 ENCOUNTER — Encounter: Admitting: Neurology

## 2024-10-30 ENCOUNTER — Encounter: Payer: Self-pay | Admitting: Orthopedic Surgery

## 2024-10-30 NOTE — Telephone Encounter (Signed)
 Reviewed with Dr. Claudene. If he has already tried bracing then he will see him in clinic to discuss options. Appointment scheduled. He was also added to wait list.

## 2024-11-04 ENCOUNTER — Other Ambulatory Visit: Payer: Self-pay

## 2024-11-05 NOTE — Progress Notes (Signed)
 Referring Physician:  Leavy Mole, PA-C No address on file  Primary Physician:  Gareth Clarity  Discussed the use of AI scribe software for clinical note transcription with the patient, who gave verbal consent to proceed.  History of Present Illness Joe Patterson is a 48 year old male who presents with worsening hand pain and dysfunction. He has had bilateral hand pain for over a decade that has progressively worsened, described as a dull, nerve-like ache with tingling, worse in the left hand. Pain is aggravated by cold and repetitive activities such as typing and increases over the course of the day. He uses wrist braces, compression gloves, and alternates mouse use between hands for partial relief. Despite being out of work and significantly reducing typing after recent neck fusion and arthroplasty, he continues to have substantial hand pain. The pain and tingling affect the dorsal index finger and dorsal thumb of the hand and can spread across the back of the hand after use. Neck surgery was performed for pain radiating down his spine into his arms and hands. His arm and back pain have improved, but hand pain has persisted. He previously engaged in handgun shooting but stopped due to severe left hand pain. He also has elbow pain with nerve-like sensations radiating from the elbow into the hand. He continues to experience tingling and nerve-like pain in both hands, most prominent in the middle finger and back of the hand.  Review of Systems:  A 10 point review of systems is negative, except for the pertinent positives and negatives detailed in the HPI.  Past Medical History: Past Medical History:  Diagnosis Date   Allergy 1990   Anxiety    Appendicitis 07/27/2019   Cyst of skin and subcutaneous tissue 06/22/2016   Eczema    can be severe   Esophageal stricture    Family history of adverse reaction to anesthesia    delerium in mother   Feeling like committing suicide 06/17/2022    GERD (gastroesophageal reflux disease)    Medication monitoring encounter 11/20/2017   Morbid obesity (HCC) 11/21/2018   BMI >35 with GERD and hyperlipidemia   Non-celiac gluten sensitivity 12/29/2015   Preventative health care 07/05/2017   TMJ (dislocation of temporomandibular joint)    Weight gain 11/14/2017    Past Surgical History: Past Surgical History:  Procedure Laterality Date   ANTERIOR CERVICAL DECOMP/DISCECTOMY FUSION N/A 10/09/2024   Procedure: ANTERIOR CERVICAL DECOMPRESSION/DISCECTOMY FUSION 1 LEVEL;  Surgeon: Clois Fret, MD;  Location: [facility] ORS;  Service: Neurosurgery;  Laterality: N/A;  C5-6 ANTERIOR CERVICAL DISCECTOMY AND FUSION   APPENDECTOMY  2020   CERVICAL DISC ARTHROPLASTY N/A 10/09/2024   Procedure: CERVICAL ANTERIOR DISC ARTHROPLASTY;  Surgeon: Clois Fret, MD;  Location: [facility] ORS;  Service: Neurosurgery;  Laterality: N/A;  C3-4 ARTHROPLASTY   ESOPHAGEAL DILATION     2020   ESOPHAGOGASTRODUODENOSCOPY (EGD) WITH PROPOFOL  N/A 02/08/2019   Procedure: ESOPHAGOGASTRODUODENOSCOPY (EGD) WITH PROPOFOL ;  Surgeon: Therisa Bi, MD;  Location: [facility];  Service: Gastroenterology;  Laterality: N/A;   HERNIA REPAIR Right    LAPAROSCOPIC APPENDECTOMY N/A 07/27/2019   Procedure: APPENDECTOMY LAPAROSCOPIC;  Surgeon: Jordis Laneta FALCON, MD;  Location: [facility] ORS;  Service: General;  Laterality: N/A;   TYMPANOSTOMY TUBE PLACEMENT     as a child    Allergies: Allergies as of 11/18/2024 - Review Complete 10/24/2024  Allergen Reaction Noted   Cat dander Itching and Nausea And Vomiting 09/12/2024   Molds & smuts Itching and Nausea And  Vomiting 09/12/2024   Other Nausea Only 06/17/2022   Bactoshield chg [chlorhexidine  gluconate] Rash 10/04/2024    Medications:  Current Outpatient Medications:    atorvastatin  (LIPITOR) 20 MG tablet, TAKE 1 TABLET BY MOUTH EVERYDAY AT BEDTIME (Patient taking differently: Take 20 mg by mouth in the morning.), Disp: 90 tablet,  Rfl: 3   CALCIUM  PO, Take 1 tablet by mouth every evening., Disp: , Rfl:    celecoxib  (CELEBREX ) 100 MG capsule, Take 1-2 capsules (100-200 mg total) by mouth 2 (two) times daily as needed. (Patient taking differently: Take 100 mg by mouth 2 (two) times daily.), Disp: 180 capsule, Rfl: 1   cetirizine  (ZYRTEC ) 10 MG tablet, Take 10 mg by mouth daily., Disp: , Rfl:    cyclobenzaprine  (FLEXERIL ) 10 MG tablet, Take 1 tablet (10 mg total) by mouth 3 (three) times daily as needed for muscle spasms (usually takes in evening causes drowsiness)., Disp: 90 tablet, Rfl: 0   MAGNESIUM PO, Take 1 capsule by mouth every evening., Disp: , Rfl:    pantoprazole  (PROTONIX ) 40 MG tablet, Take 1 tablet (40 mg total) by mouth 2 (two) times daily., Disp: 180 tablet, Rfl: 3   pregabalin  (LYRICA ) 50 MG capsule, Take 1-3 capsules (50-150 mg total) by mouth 3 (three) times daily. (Patient taking differently: Take 150 mg by mouth 3 (three) times daily.), Disp: 810 capsule, Rfl: 0   sertraline  (ZOLOFT ) 25 MG tablet, Take 1 tablet (25 mg total) by mouth daily., Disp: 90 tablet, Rfl: 3   TURMERIC PO, Take 1 capsule by mouth in the morning and at bedtime., Disp: , Rfl:   Social History: Social History   Tobacco Use   Smoking status: Former    Current packs/day: 0.00    Average packs/day: 1 pack/day for 15.0 years (15.0 ttl pk-yrs)    Types: Cigarettes    Start date: 12/22/1992    Quit date: 12/23/2007    Years since quitting: 16.8   Smokeless tobacco: Former    Types: Chew    Quit date: 2009  Vaping Use   Vaping status: Never Used  Substance Use Topics   Alcohol use: No   Drug use: No    Family Medical History: Family History  Problem Relation Age of Onset   Multiple sclerosis Mother    Hearing loss Father    Cancer Maternal Grandmother        lymphoma   Heart disease Maternal Grandfather        early 57s   Lung disease Paternal Grandmother    Blindness Paternal Grandmother    Parkinson's disease Paternal  Grandfather    Cancer Maternal Aunt    COPD Neg Hx    Diabetes Neg Hx    Hypertension Neg Hx    Stroke Neg Hx    Colon cancer Neg Hx     Physical Examination:  NEUROLOGICAL:     Awake, alert, oriented to person, place, and time.  Speech is clear and fluent.   Cranial Nerves: Pupils equal round and reactive to light.  Facial tone is symmetric. Shoulder shrug is symmetric. Tongue protrusion is midline.  There is no pronator drift.  Motor Exam: No evidence of thenar wasting bilaterally.  Overall good hand function with no major weakness noted on physical examination  Phalen and reverse Phalen are negative, no Tinel sign at the carpal tunnel, carpal compression test negative.  Positive Tinel sign at the extensors and lateral radius reproducing the tingling in the dorsum of the index and  thumb.  Some soreness in the lateral aspect of the forearm with   Gait is normal.    Medical Decision Making   Electrodiagnostics:  [facility]  [address] [place], [address]  [place], [place] [address] Tel: [phone] Fax: [phone] Test Date:  09/27/2024   Patient: Jayleon Mcfarlane DOB: Apr 02, 1976 Physician: Tonita Blanch, DO  Sex: Male Height: 5' 11 Ref Phys: Glade Boys, DEVONNA  ID#: [id number]     Technician:      History: This is a 48 year old man referred for evaluation of bilateral arm paresthesias and pain.   NCV & EMG Findings: Extensive electrodiagnostic testing of the right upper extremity and additional studies of the left shows:  Left median sensory response shows mildly prolonged latency (R3.5 ms).  Right median, right mixed palmar, and bilateral ulnar sensory responses are within normal limits.   Bilateral median and ulnar motor responses are within normal limits.   Chronic motor axon loss changes are seen affecting the C5-C6 myotomes bilaterally, without accompanying active denervation.   Impression: Chronic C5-C6 radiculopathy affecting bilateral upper  extremities, mild.   Left median neuropathy at or distal to the wrist, consistent with a clinical diagnosis of carpal tunnel syndrome.  Overall, these findings are mild in degree electrically.       ___________________________ Tonita Blanch, DO    I have personally reviewed the images and electrodiagnostics and agree with the above interpretation.  Assessment and Plan Assessment & Plan Radial tunnel syndrome, left upper extremity Chronic pain in the left hand, primarily on the dorsal side, exacerbated by typing and mouse use. Symptoms include nerve pain and tingling, particularly in the dorsal index and dorsal thumb/dorsal hand. EMG showed mild sensory nerve issues in median distribution but no muscle problems in the median nerve, suggesting carpal tunnel syndrome. However, symptoms align more with radial tunnel syndrome, likely due to inflammation of the radial nerve in the forearm, has some extensor pain on resisted finger extension Differential diagnosis includes carpal tunnel syndrome, but distribution and nature of symptoms suggest radial tunnel syndrome is more likely. - Will coordinate with Dr. Cleotilde for a radial tunnel injection in the left forearm. - If Dr. Cleotilde is unavailable, will refer to [facility] - Will follow up after the injection to assess effectiveness.  Cervical spondylosis with myelopathy, post fusion and arthroplasty Post-operative status following cervical fusion and arthroplasty at C3-4 and C5-6.  - Continue to monitor symptoms and assess for any changes   Penne MICAEL Sharps MD/MSCR Neurosurgery - Peripheral Nerve Surgery

## 2024-11-06 ENCOUNTER — Ambulatory Visit: Admitting: Nurse Practitioner

## 2024-11-06 ENCOUNTER — Encounter: Payer: Self-pay | Admitting: Nurse Practitioner

## 2024-11-06 VITALS — BP 118/76 | HR 104 | Temp 97.6°F | Resp 18 | Ht 70.5 in | Wt 191.3 lb

## 2024-11-06 DIAGNOSIS — Z0001 Encounter for general adult medical examination with abnormal findings: Secondary | ICD-10-CM

## 2024-11-06 DIAGNOSIS — M4802 Spinal stenosis, cervical region: Secondary | ICD-10-CM

## 2024-11-06 DIAGNOSIS — Z131 Encounter for screening for diabetes mellitus: Secondary | ICD-10-CM

## 2024-11-06 DIAGNOSIS — M5412 Radiculopathy, cervical region: Secondary | ICD-10-CM

## 2024-11-06 DIAGNOSIS — G8929 Other chronic pain: Secondary | ICD-10-CM

## 2024-11-06 DIAGNOSIS — Z125 Encounter for screening for malignant neoplasm of prostate: Secondary | ICD-10-CM

## 2024-11-06 DIAGNOSIS — M2550 Pain in unspecified joint: Secondary | ICD-10-CM

## 2024-11-06 DIAGNOSIS — M542 Cervicalgia: Secondary | ICD-10-CM

## 2024-11-06 DIAGNOSIS — F331 Major depressive disorder, recurrent, moderate: Secondary | ICD-10-CM

## 2024-11-06 DIAGNOSIS — Z8719 Personal history of other diseases of the digestive system: Secondary | ICD-10-CM

## 2024-11-06 DIAGNOSIS — J301 Allergic rhinitis due to pollen: Secondary | ICD-10-CM

## 2024-11-06 DIAGNOSIS — K2100 Gastro-esophageal reflux disease with esophagitis, without bleeding: Secondary | ICD-10-CM

## 2024-11-06 DIAGNOSIS — F4010 Social phobia, unspecified: Secondary | ICD-10-CM

## 2024-11-06 DIAGNOSIS — Z0000 Encounter for general adult medical examination without abnormal findings: Secondary | ICD-10-CM

## 2024-11-06 DIAGNOSIS — K219 Gastro-esophageal reflux disease without esophagitis: Secondary | ICD-10-CM

## 2024-11-06 DIAGNOSIS — E782 Mixed hyperlipidemia: Secondary | ICD-10-CM

## 2024-11-06 DIAGNOSIS — Z130 Encounter for screening for diseases of the blood and blood-forming organs and certain disorders involving the immune mechanism: Secondary | ICD-10-CM

## 2024-11-06 MED ORDER — PANTOPRAZOLE SODIUM 40 MG PO TBEC
40.0000 mg | DELAYED_RELEASE_TABLET | Freq: Two times a day (BID) | ORAL | 3 refills | Status: AC
Start: 2024-11-06 — End: ?

## 2024-11-06 MED ORDER — CELECOXIB 100 MG PO CAPS: 200.0000 mg | ORAL_CAPSULE | Freq: Two times a day (BID) | ORAL | 1 refills | Status: DC

## 2024-11-06 MED ORDER — CYCLOBENZAPRINE HCL 10 MG PO TABS: 10.0000 mg | ORAL_TABLET | Freq: Three times a day (TID) | ORAL | 1 refills | Status: AC | PRN

## 2024-11-06 NOTE — Progress Notes (Signed)
 Name: CALLAHAN WILD   MRN: [id number]    DOB: Sep 24, 1976   Date:11/06/2024       Progress Note  Subjective  Chief Complaint  Chief Complaint  Patient presents with   Annual Exam    HPI  Patient presents for annual CPE . Discussed the use of AI scribe software for clinical note transcription with the patient, who gave verbal consent to proceed.  History of Present Illness TRAVONE GEORG is a 48 year old male who presents for an annual physical exam.  Postoperative recovery - Recent surgery with current activity limited to short walks - Reduced ambulation compared to baseline due to surgical recovery  Musculoskeletal pain and arthritis - Spinal stenosis with ongoing symptoms - Arthritis predominantly affecting one knee - Recent painful knee injection performed two weeks ago at [facility] - Use of knee brace for support - Prior injections in feet for plantar fasciitis and in neck and back without symptomatic relief - Injections disrupt sleep  Sleep disturbance - Sleep duration between five and eight hours per night - Sleep disruption attributed to musculoskeletal injections  Medication management - Current medications: sertraline  25 mg daily, Lyrica  150 mg three times daily, pantoprazole  40 mg twice daily, Flexeril  10 mg three times a day as needed, Zyrtec  10 mg daily, Celebrex  100 mg twice daily, atorvastatin  20 mg daily - Attempting to wean off Lyrica  - Running low on atorvastatin  and pantoprazole   Allergic rhinitis - History of allergic rhinitis - Current use of Zyrtec  10 mg daily  Gastroesophageal reflux disease - History of GERD - Current use of pantoprazole  40 mg twice daily  Hyperlipidemia - History of hyperlipidemia - Current use of atorvastatin  20 mg daily - Recent lipid panel within normal limits  Depression and social anxiety - History of depression and social anxiety - Current use of sertraline  25 mg daily  General health maintenance - Stable  weight - Diet includes all food groups - Last dental exam less than six months ago - Last eye exam less than one year ago - Recent laboratory studies (A1c, CBC, CMP, PSA) all within normal limits      Diet: well balanced diet Exercise: walking Sleep: 5-8 hours Last dental exam:6 months ago Last eye exam: 1 year ago  Depression: phq 9 is negative    11/06/2024    3:10 PM 05/24/2024    2:02 PM 11/03/2023    8:04 AM 10/27/2023    1:50 PM 10/06/2023    9:52 AM  Depression screen PHQ 2/9  Decreased Interest 0 0 0 0 0  Down, Depressed, Hopeless 0 0 0 0 0  PHQ - 2 Score 0 0 0 0 0  Altered sleeping 0 0 0 0 0  Tired, decreased energy 0 0 0 0 0  Change in appetite 0 0 0 0 0  Feeling bad or failure about yourself  0 0 0 0 0  Trouble concentrating 0 0 0 0 0  Moving slowly or fidgety/restless 0 0 0 0 0  Suicidal thoughts 0 0 0 0 0  PHQ-9 Score 0 0  0  0  0   Difficult doing work/chores Not difficult at all Not difficult at all Not difficult at all Not difficult at all Not difficult at all     Data saved with a previous flowsheet row definition    Hypertension:  BP Readings from Last 3 Encounters:  11/06/24 118/76  10/24/24 (!) 140/82  10/09/24 (!) 141/96    Obesity:  Wt Readings from Last 3 Encounters:  11/06/24 191 lb 4.8 oz (86.8 kg)  10/24/24 190 lb 2 oz (86.2 kg)  10/09/24 199 lb (90.3 kg)   BMI Readings from Last 3 Encounters:  11/06/24 27.06 kg/m  10/24/24 26.89 kg/m  10/09/24 28.55 kg/m     Lipids:  Lab Results  Component Value Date   CHOL 128 11/03/2023   CHOL 110 05/05/2022   CHOL 126 02/26/2021   Lab Results  Component Value Date   HDL 42 11/03/2023   HDL 51 05/05/2022   HDL 44 02/26/2021   Lab Results  Component Value Date   LDLCALC 68 11/03/2023   LDLCALC 44 05/05/2022   LDLCALC 63 02/26/2021   Lab Results  Component Value Date   TRIG 92 11/03/2023   TRIG 71 05/05/2022   TRIG 107 02/26/2021   Lab Results  Component Value Date    CHOLHDL 3.0 11/03/2023   CHOLHDL 2.2 05/05/2022   CHOLHDL 2.9 02/26/2021   No results found for: LDLDIRECT Glucose:  Glucose, Bld  Date Value Ref Range Status  11/03/2023 91 65 - 99 mg/dL Final    Comment:    .            Fasting reference interval .   10/31/2022 83 65 - 99 mg/dL Final    Comment:    .            Fasting reference interval .   05/05/2022 89 65 - 99 mg/dL Final    Comment:    .            Fasting reference interval .     Flowsheet Row Office Visit from 11/06/2024 in [facility]  AUDIT-C Score 0     Single STD testing and prevention (HIV/chl/gon/syphilis): completed Hep C: completed  Skin cancer: Discussed monitoring for atypical lesions Colorectal cancer: 03/18/2023 Prostate cancer: ordered Lab Results  Component Value Date   PSA 0.37 11/03/2023     Lung cancer:   Low Dose CT Chest recommended if Age 90-80 years, 30 pack-year currently smoking OR have quit w/in 15years. Patient does not qualify.   AAA:  The USPSTF recommends one-time screening with ultrasonography in men ages 58 to 59 years who have ever smoked ECG:  10/10/2024  Vaccines:  HPV: up to at age 28 , ask insurance if age between 44-45  Shingrix: 5-64 yo and ask insurance if covered when patient above 43 yo Pneumonia:  educated and discussed with patient. Flu:  educated and discussed with patient.  Advanced Care Planning: A voluntary discussion about advance care planning including the explanation and discussion of advance directives.  Discussed health care proxy and Living will, and the patient was able to identify a health care proxy as Darron Stuck.  Patient does not have a living will at present time. If patient does have living will, I have requested they bring this to the clinic to be scanned in to their chart.  Patient Active Problem List   Diagnosis Date Noted   Spinal stenosis in cervical region 10/09/2024   Insomnia due to medical condition  05/24/2024   Advanced care planning/counseling discussion 11/03/2023   Mass of right inguinal region 08/04/2022   Cervical radiculopathy 07/28/2022   Bilateral carpal tunnel syndrome 06/17/2022   Circadian rhythm sleep disorder, shift work type 06/17/2022   Low back pain 06/17/2022   Moderate episode of recurrent major depressive disorder (HCC) 06/17/2022   Plantar fascia rupture 01/12/2022  Hypertriglyceridemia 11/26/2018   Elevated glucose 11/26/2018   Allergic rhinitis 11/14/2017   Hyperlipidemia 11/14/2017   TMJ (temporomandibular joint syndrome) 06/22/2016   Shoulder pain, bilateral 06/22/2016   Social anxiety disorder 12/29/2015   Allergy to perfume 12/29/2015   History of esophageal stricture 12/29/2015   Eczema, allergic 12/29/2015   Gastroesophageal reflux 12/29/2015    Past Surgical History:  Procedure Laterality Date   ANTERIOR CERVICAL DECOMP/DISCECTOMY FUSION N/A 10/09/2024   Procedure: ANTERIOR CERVICAL DECOMPRESSION/DISCECTOMY FUSION 1 LEVEL;  Surgeon: Clois Fret, MD;  Location: [facility] ORS;  Service: Neurosurgery;  Laterality: N/A;  C5-6 ANTERIOR CERVICAL DISCECTOMY AND FUSION   APPENDECTOMY  2020   CERVICAL DISC ARTHROPLASTY N/A 10/09/2024   Procedure: CERVICAL ANTERIOR DISC ARTHROPLASTY;  Surgeon: Clois Fret, MD;  Location: [facility] ORS;  Service: Neurosurgery;  Laterality: N/A;  C3-4 ARTHROPLASTY   ESOPHAGEAL DILATION     2020   ESOPHAGOGASTRODUODENOSCOPY (EGD) WITH PROPOFOL  N/A 02/08/2019   Procedure: ESOPHAGOGASTRODUODENOSCOPY (EGD) WITH PROPOFOL ;  Surgeon: Therisa Bi, MD;  Location: [facility] END[facility]COPY;  Service: Gastroenterology;  Laterality: N/A;   HERNIA REPAIR Right    LAPAROSCOPIC APPENDECTOMY N/A 07/27/2019   Procedure: APPENDECTOMY LAPAROSCOPIC;  Surgeon: Jordis Laneta FALCON, MD;  Location: [facility] ORS;  Service: General;  Laterality: N/A;   TYMPANOSTOMY TUBE PLACEMENT     as a child    Family History  Problem Relation Age of Onset   Multiple  sclerosis Mother    Hearing loss Father    Cancer Maternal Grandmother        lymphoma   Heart disease Maternal Grandfather        early 56s   Lung disease Paternal Grandmother    Blindness Paternal Grandmother    Parkinson's disease Paternal Grandfather    Cancer Maternal Aunt    COPD Neg Hx    Diabetes Neg Hx    Hypertension Neg Hx    Stroke Neg Hx    Colon cancer Neg Hx     Social History   Socioeconomic History   Marital status: Single    Spouse name: Not on file   Number of children: Not on file   Years of education: 12   Highest education level: Bachelor's degree (e.g., BA, AB, BS)  Occupational History   Occupation: [employer]  Tobacco Use   Smoking status: Former    Current packs/day: 0.00    Average packs/day: 1 pack/day for 15.0 years (15.0 ttl pk-yrs)    Types: Cigarettes    Start date: 12/22/1992    Quit date: 12/23/2007    Years since quitting: 16.8   Smokeless tobacco: Former    Types: Chew    Quit date: 2009  Vaping Use   Vaping status: Never Used  Substance and Sexual Activity   Alcohol use: No   Drug use: No   Sexual activity: Yes  Other Topics Concern   Not on file  Social History Narrative   Not on file   Social Drivers of Health   Financial Resource Strain: Low Risk  (05/24/2024)   Overall Financial Resource Strain (CARDIA)    Difficulty of Paying Living Expenses: Not very hard  Food Insecurity: No Food Insecurity (05/24/2024)   Hunger Vital Sign    Worried About Running Out of Food in the Last Year: Never true    Ran Out of Food in the Last Year: Never true  Transportation Needs: No Transportation Needs (05/24/2024)   PRAPARE - Administrator, Civil Service (Medical): No  Lack of Transportation (Non-Medical): No  Physical Activity: Sufficiently Active (05/24/2024)   Exercise Vital Sign    Days of Exercise per Week: 6 days    Minutes of Exercise per Session: 30 min  Stress: No Stress Concern Present (05/24/2024)   [employer] - Occupational Stress Questionnaire    Feeling of Stress: Not at all  Social Connections: Moderately Integrated (05/24/2024)   Social Connection and Isolation Panel    Frequency of Communication with Friends and Family: More than three times a week    Frequency of Social Gatherings with Friends and Family: Once a week    Attends Religious Services: More than 4 times per year    Active Member of [employer] or Organizations: Yes    Attends Banker Meetings: More than 4 times per year    Marital Status: Never married  Intimate Partner Violence: Not At Risk (11/06/2024)   Humiliation, Afraid, Rape, and Kick questionnaire    Fear of Current or Ex-Partner: No    Emotionally Abused: No    Physically Abused: No    Sexually Abused: No     Current Outpatient Medications:    atorvastatin  (LIPITOR) 20 MG tablet, TAKE 1 TABLET BY MOUTH EVERYDAY AT BEDTIME (Patient taking differently: Take 20 mg by mouth in the morning.), Disp: 90 tablet, Rfl: 3   CALCIUM  PO, Take 1 tablet by mouth every evening., Disp: , Rfl:    cetirizine  (ZYRTEC ) 10 MG tablet, Take 10 mg by mouth daily., Disp: , Rfl:    MAGNESIUM PO, Take 1 capsule by mouth every evening., Disp: , Rfl:    pregabalin  (LYRICA ) 50 MG capsule, Take 1-3 capsules (50-150 mg total) by mouth 3 (three) times daily. (Patient taking differently: Take 150 mg by mouth 3 (three) times daily.), Disp: 810 capsule, Rfl: 0   sertraline  (ZOLOFT ) 25 MG tablet, Take 1 tablet (25 mg total) by mouth daily., Disp: 90 tablet, Rfl: 3   TURMERIC PO, Take 1 capsule by mouth in the morning and at bedtime., Disp: , Rfl:    celecoxib  (CELEBREX ) 100 MG capsule, Take 2 capsules (200 mg total) by mouth 2 (two) times daily., Disp: 180 capsule, Rfl: 1   cyclobenzaprine  (FLEXERIL ) 10 MG tablet, Take 1 tablet (10 mg total) by mouth 3 (three) times daily as needed for muscle spasms (usually takes in evening causes drowsiness)., Disp: 90 tablet,  Rfl: 1   pantoprazole  (PROTONIX ) 40 MG tablet, Take 1 tablet (40 mg total) by mouth 2 (two) times daily., Disp: 180 tablet, Rfl: 3  Allergies  Allergen Reactions   Cat Dander Itching and Nausea And Vomiting    Animals. Sore throat, itchy eyes   Molds & Smuts Itching and Nausea And Vomiting    Sore throat, itchy eyes   Other Nausea Only    Perfumes   Bactoshield Chg [Chlorhexidine  Gluconate] Rash    Causes Eczema Flare Up     ROS  Constitutional: Negative for fever or weight change.  Respiratory: Negative for cough and shortness of breath.   Cardiovascular: Negative for chest pain or palpitations.  Gastrointestinal: Negative for abdominal pain, no bowel changes.  Musculoskeletal: Negative for gait problem or joint swelling.  Skin: Negative for rash.  Neurological: Negative for dizziness or headache.  No other specific complaints in a complete review of systems (except as listed in HPI above).    Objective  Vitals:   11/06/24 1508  BP: 118/76  Pulse: (!) 104  Resp: 18  Temp: 97.6 F (36.4 C)  SpO2: 96%  Weight: 191 lb 4.8 oz (86.8 kg)  Height: 5' 10.5 (1.791 m)    Body mass index is 27.06 kg/m.  Physical Exam Vitals reviewed.  Constitutional:      Appearance: Normal appearance.  HENT:     Head: Normocephalic.     Right Ear: Tympanic membrane normal.     Left Ear: Tympanic membrane normal.     Nose: Nose normal.  Eyes:     Extraocular Movements: Extraocular movements intact.     Conjunctiva/sclera: Conjunctivae normal.     Pupils: Pupils are equal, round, and reactive to light.  Neck:     Thyroid: No thyroid mass, thyromegaly or thyroid tenderness.  Cardiovascular:     Rate and Rhythm: Normal rate and regular rhythm.     Pulses: Normal pulses.     Heart sounds: Normal heart sounds.  Pulmonary:     Effort: Pulmonary effort is normal.     Breath sounds: Normal breath sounds.  Abdominal:     General: Bowel sounds are normal.     Palpations: Abdomen is  soft.  Musculoskeletal:        General: Normal range of motion.     Cervical back: Normal range of motion and neck supple.     Right lower leg: No edema.     Left lower leg: No edema.  Skin:    General: Skin is warm and dry.     Capillary Refill: Capillary refill takes less than 2 seconds.  Neurological:     General: No focal deficit present.     Mental Status: He is alert and oriented to person, place, and time. Mental status is at baseline.  Psychiatric:        Mood and Affect: Mood normal.        Behavior: Behavior normal.        Thought Content: Thought content normal.        Judgment: Judgment normal.      Recent Results (from the past 2160 hours)  Type and screen [facility]     Status: None   Collection Time: 10/03/24 11:30 AM  Result Value Ref Range   ABO/RH(D) A POS    Antibody Screen NEG    Sample Expiration 10/17/2024,2359    Extend sample reason      NO TRANSFUSIONS OR PREGNANCY IN THE PAST 3 MONTHS Performed at [facility], [address] [place], [place] [address]   CBC per protocol     Status: Abnormal   Collection Time: 10/03/24 11:30 AM  Result Value Ref Range   WBC 5.1 4.0 - 10.5 K/uL   RBC 4.10 (L) 4.22 - 5.81 MIL/uL   Hemoglobin 12.2 (L) 13.0 - 17.0 g/dL   HCT 64.2 (L) 60.9 - 47.9 %   MCV 87.1 80.0 - 100.0 fL   MCH 29.8 26.0 - 34.0 pg   MCHC 34.2 30.0 - 36.0 g/dL   RDW 87.8 88.4 - 84.4 %   Platelets 244 150 - 400 K/uL   nRBC 0.0 0.0 - 0.2 %    Comment: Performed at [facility], [address] [place], [place] [address]  Surgical pcr screen     Status: None   Collection Time: 10/03/24 11:36 AM   Specimen: Nasal Mucosa; Nasal Swab  Result Value Ref Range   MRSA, PCR NEGATIVE NEGATIVE   Staphylococcus aureus NEGATIVE NEGATIVE    Comment: (NOTE) The Xpert SA Assay (  FDA approved for NASAL specimens in patients 47 years of age and older), is one component of a comprehensive surveillance program. It is  not intended to diagnose infection nor to guide or monitor treatment. Performed at [facility], [address] [place], [place] [address]   ABO/Rh     Status: None   Collection Time: 10/09/24  6:13 AM  Result Value Ref Range   ABO/RH(D)      A POS Performed at [facility], [address] [place], [place] [address]      Fall Risk:    11/06/2024    3:09 PM 05/24/2024    2:01 PM 11/03/2023    8:04 AM 10/27/2023    1:46 PM 10/06/2023    9:47 AM  Fall Risk   Falls in the past year? 0 0 0 0 0  Number falls in past yr: 0 0 0 0 0  Injury with Fall? 0 0 0 0 0  Risk for fall due to :  No Fall Risks No Fall Risks  No Fall Risks  Follow up Falls evaluation completed Falls evaluation completed Falls prevention discussed;Education provided;Falls evaluation completed  Falls prevention discussed;Education provided;Falls evaluation completed      Functional Status Survey: Is the patient deaf or have difficulty hearing?: No Does the patient have difficulty seeing, even when wearing glasses/contacts?: No Does the patient have difficulty concentrating, remembering, or making decisions?: No Does the patient have difficulty walking or climbing stairs?: No Does the patient have difficulty dressing or bathing?: No Does the patient have difficulty doing errands alone such as visiting a doctor's office or shopping?: No    Assessment & Plan  Problem List Items Addressed This Visit       Respiratory   Allergic rhinitis     Digestive   Gastroesophageal reflux (Chronic)   Relevant Medications   pantoprazole  (PROTONIX ) 40 MG tablet     Nervous and Auditory   Cervical radiculopathy   Relevant Medications   celecoxib  (CELEBREX ) 100 MG capsule   cyclobenzaprine  (FLEXERIL ) 10 MG tablet     Other   Social anxiety disorder (Chronic)   History of esophageal stricture   Relevant Medications   pantoprazole  (PROTONIX ) 40 MG tablet   Hyperlipidemia   Relevant Orders    Comprehensive metabolic panel with GFR   Lipid panel   Moderate episode of recurrent major depressive disorder (HCC)   Spinal stenosis in cervical region   Other Visit Diagnoses       Annual physical exam    -  Primary   Relevant Orders   CBC with Differential/Platelet   Comprehensive metabolic panel with GFR   Lipid panel   Hemoglobin A1c   PSA     Screening for diabetes mellitus       Relevant Orders   Comprehensive metabolic panel with GFR   Hemoglobin A1c     Screening for deficiency anemia       Relevant Orders   CBC with Differential/Platelet     Screening for prostate cancer       Relevant Orders   PSA     Polyarthralgia       Relevant Medications   celecoxib  (CELEBREX ) 100 MG capsule     Neck pain, chronic       Relevant Medications   celecoxib  (CELEBREX ) 100 MG capsule   cyclobenzaprine  (FLEXERIL ) 10 MG tablet      Assessment and Plan Assessment & Plan Adult Wellness Visit Routine adult wellness visit  with normal blood pressure at 118/76 mmHg, stable weight, and negative depression screening. Up to date on colorectal cancer screening. Last dental exam was less than six months ago and last eye exam was less than a year ago. No need to repeat HIV and hepatitis C screenings. No living will in place. - Ordered PSA test -cbc, cmp, A1c and lipid panel  Bilateral knee osteoarthritis Chronic bilateral knee osteoarthritis with recent knee injection causing pain. Worsening symptoms in the right knee. Uses a knee brace and sleeve for support. Previous injections in feet and neck/back were ineffective. - Continue use of knee brace and sleeve  Cervical spinal stenosis with radiculopathy and cervicalgia Chronic cervical spinal stenosis with radiculopathy and cervicalgia. Previous injections in neck and back were ineffective.  Plantar fasciitis Chronic plantar fasciitis with previous ineffective injections in feet.      -Prostate cancer screening and PSA options  (with potential risks and benefits of testing vs not testing) were discussed along with recent recs/guidelines. -USPSTF grade A and B recommendations reviewed with patient; age-appropriate recommendations, preventive care, screening tests, etc discussed and encouraged; healthy living encouraged; see AVS for patient education given to patient -Discussed importance of 150 minutes of physical activity weekly, eat two servings of fish weekly, eat one serving of tree nuts ( cashews, pistachios, pecans, almonds.SABRA) every other day, eat 6 servings of fruit/vegetables daily and drink plenty of water and avoid sweet beverages.  -Reviewed Health Maintenance: yes

## 2024-11-07 LAB — CBC WITH DIFFERENTIAL/PLATELET
Absolute Lymphocytes: 1234 {cells}/uL (ref 850–3900)
Absolute Monocytes: 509 {cells}/uL (ref 200–950)
Basophils Absolute: 19 {cells}/uL (ref 0–200)
Basophils Relative: 0.4 %
Eosinophils Absolute: 168 {cells}/uL (ref 15–500)
Eosinophils Relative: 3.5 %
HCT: 39.5 % (ref 39.4–51.1)
Hemoglobin: 13 g/dL — ABNORMAL LOW (ref 13.2–17.1)
MCH: 30.2 pg (ref 27.0–33.0)
MCHC: 32.9 g/dL (ref 31.6–35.4)
MCV: 91.9 fL (ref 81.4–101.7)
MPV: 11 fL (ref 7.5–12.5)
Monocytes Relative: 10.6 %
Neutro Abs: 2870 {cells}/uL (ref 1500–7800)
Neutrophils Relative %: 59.8 %
Platelets: 262 Thousand/uL (ref 140–400)
RBC: 4.3 Million/uL (ref 4.20–5.80)
RDW: 12.5 % (ref 11.0–15.0)
Total Lymphocyte: 25.7 %
WBC: 4.8 Thousand/uL (ref 3.8–10.8)

## 2024-11-07 LAB — COMPREHENSIVE METABOLIC PANEL WITH GFR
AG Ratio: 2.9 (calc) — ABNORMAL HIGH (ref 1.0–2.5)
ALT: 47 U/L — ABNORMAL HIGH (ref 9–46)
AST: 27 U/L (ref 10–40)
Albumin: 5 g/dL (ref 3.6–5.1)
Alkaline phosphatase (APISO): 65 U/L (ref 36–130)
BUN: 17 mg/dL (ref 7–25)
CO2: 28 mmol/L (ref 20–32)
Calcium: 9.7 mg/dL (ref 8.6–10.3)
Chloride: 102 mmol/L (ref 98–110)
Creat: 0.73 mg/dL (ref 0.60–1.29)
Globulin: 1.7 g/dL — ABNORMAL LOW (ref 1.9–3.7)
Glucose, Bld: 92 mg/dL (ref 65–99)
Potassium: 4.5 mmol/L (ref 3.5–5.3)
Sodium: 139 mmol/L (ref 135–146)
Total Bilirubin: 0.4 mg/dL (ref 0.2–1.2)
Total Protein: 6.7 g/dL (ref 6.1–8.1)
eGFR: 113 mL/min/1.73m2 (ref >=60)

## 2024-11-07 LAB — LIPID PANEL
Cholesterol: 117 mg/dL (ref <200)
HDL: 63 mg/dL (ref >=40)
LDL Cholesterol (Calc): 40 mg/dL
Non-HDL Cholesterol (Calc): 54 mg/dL (ref <130)
Total CHOL/HDL Ratio: 1.9 (calc) (ref <5.0)
Triglycerides: 64 mg/dL (ref <150)

## 2024-11-07 LAB — HEMOGLOBIN A1C
Hgb A1c MFr Bld: 5.4 % (ref <5.7)
Mean Plasma Glucose: 108 mg/dL
eAG (mmol/L): 6 mmol/L

## 2024-11-07 LAB — PSA: PSA: 0.91 ng/mL (ref <=4.00)

## 2024-11-11 ENCOUNTER — Ambulatory Visit: Payer: Self-pay | Admitting: Nurse Practitioner

## 2024-11-15 ENCOUNTER — Ambulatory Visit: Admitting: Nurse Practitioner

## 2024-11-18 ENCOUNTER — Ambulatory Visit: Admitting: Neurosurgery

## 2024-11-18 ENCOUNTER — Encounter: Payer: Self-pay | Admitting: Neurosurgery

## 2024-11-18 VITALS — BP 136/88 | Wt 194.4 lb

## 2024-11-18 DIAGNOSIS — R200 Anesthesia of skin: Secondary | ICD-10-CM

## 2024-11-18 DIAGNOSIS — G5632 Lesion of radial nerve, left upper limb: Secondary | ICD-10-CM

## 2024-11-20 ENCOUNTER — Other Ambulatory Visit: Payer: Self-pay

## 2024-11-20 DIAGNOSIS — M5412 Radiculopathy, cervical region: Secondary | ICD-10-CM

## 2024-11-20 NOTE — Progress Notes (Signed)
 Error

## 2024-11-20 NOTE — Addendum Note (Signed)
 Addended by: Lazaro Isenhower W on: 11/20/2024 11:34 AM   Modules accepted: Orders

## 2024-11-21 ENCOUNTER — Ambulatory Visit: Admitting: Neurosurgery

## 2024-11-21 ENCOUNTER — Ambulatory Visit

## 2024-11-21 ENCOUNTER — Encounter: Payer: Self-pay | Admitting: Neurosurgery

## 2024-11-21 ENCOUNTER — Telehealth: Payer: Self-pay | Admitting: Neurosurgery

## 2024-11-21 DIAGNOSIS — M542 Cervicalgia: Secondary | ICD-10-CM

## 2024-11-21 DIAGNOSIS — M5412 Radiculopathy, cervical region: Secondary | ICD-10-CM

## 2024-11-21 DIAGNOSIS — Z09 Encounter for follow-up examination after completed treatment for conditions other than malignant neoplasm: Secondary | ICD-10-CM

## 2024-11-21 MED ORDER — PREGABALIN 150 MG PO CAPS: 150.0000 mg | ORAL_CAPSULE | Freq: Three times a day (TID) | ORAL | 0 refills | Status: AC

## 2024-11-21 NOTE — Telephone Encounter (Signed)
 Patient stopped by the front desk to follow up on the referral that Dr. Claudene was placing for him to have an injection with Dr. Cleotilde at [facility]. Please advise.

## 2024-11-21 NOTE — Progress Notes (Signed)
° °  REFERRING PHYSICIAN:  Leavy Mole, Pa-c No address on file  DOS: 10/09/24  cervical disc arthroplasty C3-C4 and ACDF C5-C6  HISTORY OF PRESENT ILLNESS: Joe Patterson is status post above surgery.   He is still having some intrascapular pain.  He also has some discomfort down his left arm that is intermittent in nature.  It is variable at times.  PHYSICAL EXAMINATION:  NEUROLOGICAL:  General: In no acute distress.   Awake, alert, oriented to person, place, and time.  Pupils equal round and reactive to light.  Facial tone is symmetric.    Strength: Side Biceps Triceps Deltoid Interossei Grip Wrist Ext. Wrist Flex.  R 5 5 5 5 5 5 5   L 5 5 5 5 5 5 5    Incision c/d/i  Imaging:  No complications noted   Assessment / Plan: Joe Patterson is doing fair s/p above surgery.  I will refill his pregabalin .    I will give him exercises for his neck.  I am hopeful that he will see some improvement in his pain.  If he is still having significant pain at his next appointment, we will repeat his MRI scan and get an EMG.  He may also need evaluation of his shoulder at that time depending on his symptoms.  He is not ready to return to work.  [facility]

## 2024-11-22 ENCOUNTER — Encounter: Admitting: Family Medicine

## 2024-11-22 ENCOUNTER — Telehealth: Payer: Self-pay | Admitting: Neurosurgery

## 2024-11-22 NOTE — Telephone Encounter (Signed)
 Pt is calling in stating that their fmla paperwork needs #8 on page 3 of their fmla paperwork filed out with the dates. Dates: Oct 29th-Jan 21st Please adjust and resend via fax or email.   ATTNBETHA Leita Gaskins  Fax: [phone]  Email: [email or web address]

## 2024-11-26 NOTE — Telephone Encounter (Signed)
 Noted. We have also told the patient to reach out to his office. (Just fyi)

## 2024-12-11 ENCOUNTER — Encounter: Payer: Self-pay | Admitting: Nurse Practitioner

## 2024-12-11 ENCOUNTER — Ambulatory Visit (INDEPENDENT_AMBULATORY_CARE_PROVIDER_SITE_OTHER): Admitting: Nurse Practitioner

## 2024-12-11 VITALS — BP 130/92 | HR 77 | Temp 97.5°F | Ht 70.5 in | Wt 199.0 lb

## 2024-12-11 DIAGNOSIS — E782 Mixed hyperlipidemia: Secondary | ICD-10-CM

## 2024-12-11 DIAGNOSIS — G8929 Other chronic pain: Secondary | ICD-10-CM | POA: Diagnosis not present

## 2024-12-11 DIAGNOSIS — F419 Anxiety disorder, unspecified: Secondary | ICD-10-CM | POA: Diagnosis not present

## 2024-12-11 DIAGNOSIS — M5412 Radiculopathy, cervical region: Secondary | ICD-10-CM | POA: Diagnosis not present

## 2024-12-11 DIAGNOSIS — F331 Major depressive disorder, recurrent, moderate: Secondary | ICD-10-CM

## 2024-12-11 DIAGNOSIS — Z20828 Contact with and (suspected) exposure to other viral communicable diseases: Secondary | ICD-10-CM

## 2024-12-11 DIAGNOSIS — M542 Cervicalgia: Secondary | ICD-10-CM | POA: Diagnosis not present

## 2024-12-11 DIAGNOSIS — M2550 Pain in unspecified joint: Secondary | ICD-10-CM

## 2024-12-11 MED ORDER — ATORVASTATIN CALCIUM 20 MG PO TABS: 20.0000 mg | ORAL_TABLET | Freq: Every morning | ORAL | 3 refills | Status: AC

## 2024-12-11 MED ORDER — CELECOXIB 100 MG PO CAPS: 200.0000 mg | ORAL_CAPSULE | Freq: Two times a day (BID) | ORAL | 1 refills | Status: DC

## 2024-12-11 MED ORDER — SERTRALINE HCL 50 MG PO TABS: 50.0000 mg | ORAL_TABLET | Freq: Every day | ORAL | 1 refills | Status: AC

## 2024-12-11 MED ORDER — OSELTAMIVIR PHOSPHATE 75 MG PO CAPS: 75.0000 mg | ORAL_CAPSULE | Freq: Two times a day (BID) | ORAL | 0 refills | Status: DC

## 2024-12-11 NOTE — Progress Notes (Addendum)
 "  BP (!) 130/92   Pulse 77   Temp (!) 97.5 F (36.4 C)   Ht 5' 10.5 (1.791 m)   Wt 199 lb (90.3 kg)   SpO2 99%   BMI 28.15 kg/m    Subjective:    Patient ID: Joe Patterson, male    DOB: December 17, 1975, 48 y.o.   MRN: [id number]  HPI: Joe Patterson is a 48 y.o. male  Chief Complaint  Patient presents with   Anxiety    Would like to increase zoloft  to 50 mg   Discussed the use of AI scribe software for clinical note transcription with the patient, who gave verbal consent to proceed.  History of Present Illness Joe Patterson is a 48 year old male who presents with worsening anxiety and medication management.  Anxiety symptoms and management - Worsening anxiety symptoms - Current Zoloft  dose is 25 mg daily, well tolerated but perceived as insufficient - Requests increase in Zoloft  to 50 mg daily - Anxiety related to ongoing medical issues and anticipated return to work on January 20th - Expresses concern about potential need for future surgery  Cervical radiculopathy and postoperative recovery - History of cervical anterior disc arthroplasty in October - Ongoing use of Celebrex  100 mg twice daily for cervical radiculopathy - Continued use of Lyrica , prescribed by neurosurgeon - Concerned about delayed recovery and not healing as expected - Scheduled for neurosurgical follow-up on January 20th - Does not feel ready to return to work due to persistent symptoms  Blood pressure monitoring - Monitors blood pressure at home, typically within normal range - Elevated blood pressure during medical visits, attributed to anxiety and caffeine intake  Infectious disease prophylaxis concerns - Concerned about influenza exposure - Inquires about having Tamiflu  available as a precaution  Medication management - Current medications include Lipitor 20 mg daily, Celebrex  100 mg twice daily, Lyrica , and Zoloft  25 mg daily         12/11/2024    9:22 AM 11/06/2024    3:10 PM  05/24/2024    2:02 PM  Depression screen PHQ 2/9  Decreased Interest 0 0 0  Down, Depressed, Hopeless 0 0 0  PHQ - 2 Score 0 0 0  Altered sleeping 2 0 0  Tired, decreased energy 0 0 0  Change in appetite 1 0 0  Feeling bad or failure about yourself  0 0 0  Trouble concentrating 0 0 0  Moving slowly or fidgety/restless 0 0 0  Suicidal thoughts 0 0 0  PHQ-9 Score 3 0 0   Difficult doing work/chores Not difficult at all Not difficult at all Not difficult at all     Data saved with a previous flowsheet row definition       12/11/2024    9:22 AM 11/06/2024    3:09 PM 05/24/2024    2:02 PM 11/03/2023    8:12 AM  GAD 7 : Generalized Anxiety Score  Nervous, Anxious, on Edge 1 0 0 0  Control/stop worrying 2 0 0 0  Worry too much - different things 1 0 0 0  Trouble relaxing 0 0 0 0  Restless 0 0 0 0  Easily annoyed or irritable 0 0 0 0  Afraid - awful might happen 0 0 0 0  Total GAD 7 Score 4 0 0 0  Anxiety Difficulty Not difficult at all Not difficult at all Not difficult at all Not difficult at all     Relevant past medical, surgical,  family and social history reviewed and updated as indicated. Interim medical history since our last visit reviewed. Allergies and medications reviewed and updated.  Review of Systems  Ten systems reviewed and is negative except as mentioned in HPI      Objective:      BP (!) 130/92   Pulse 77   Temp (!) 97.5 F (36.4 C)   Ht 5' 10.5 (1.791 m)   Wt 199 lb (90.3 kg)   SpO2 99%   BMI 28.15 kg/m    Wt Readings from Last 3 Encounters:  12/11/24 199 lb (90.3 kg)  11/21/24 194 lb (88 kg)  11/18/24 194 lb 6.4 oz (88.2 kg)    Physical Exam GENERAL: Alert, cooperative, well developed, no acute distress HEENT: Normocephalic, normal oropharynx, moist mucous membranes CHEST: Clear to auscultation bilaterally, No wheezes, rhonchi, or crackles CARDIOVASCULAR: Normal heart rate and rhythm, S1 and S2 normal without murmurs ABDOMEN: Soft,  non-tender, non-distended, without organomegaly, Normal bowel sounds EXTREMITIES: No cyanosis or edema NEUROLOGICAL: Cranial nerves grossly intact, Moves all extremities without gross motor or sensory deficit  Results for orders placed or performed in visit on 11/06/24  CBC with Differential/Platelet   Collection Time: 11/06/24  3:28 PM  Result Value Ref Range   WBC 4.8 3.8 - 10.8 Thousand/uL   RBC 4.30 4.20 - 5.80 Million/uL   Hemoglobin 13.0 (L) 13.2 - 17.1 g/dL   HCT 60.4 60.5 - 48.8 %   MCV 91.9 81.4 - 101.7 fL   MCH 30.2 27.0 - 33.0 pg   MCHC 32.9 31.6 - 35.4 g/dL   RDW 87.4 88.9 - 84.9 %   Platelets 262 140 - 400 Thousand/uL   MPV 11.0 7.5 - 12.5 fL   Neutro Abs 2,870 1,500 - 7,800 cells/uL   Absolute Lymphocytes 1,234 850 - 3,900 cells/uL   Absolute Monocytes 509 200 - 950 cells/uL   Eosinophils Absolute 168 15 - 500 cells/uL   Basophils Absolute 19 0 - 200 cells/uL   Neutrophils Relative % 59.8 %   Total Lymphocyte 25.7 %   Monocytes Relative 10.6 %   Eosinophils Relative 3.5 %   Basophils Relative 0.4 %  Comprehensive metabolic panel with GFR   Collection Time: 11/06/24  3:28 PM  Result Value Ref Range   Glucose, Bld 92 65 - 99 mg/dL   BUN 17 7 - 25 mg/dL   Creat 9.26 9.39 - 8.70 mg/dL   eGFR 886 > OR = 60 fO/fpw/8.26f7   BUN/Creatinine Ratio SEE NOTE: 6 - 22 (calc)   Sodium 139 135 - 146 mmol/L   Potassium 4.5 3.5 - 5.3 mmol/L   Chloride 102 98 - 110 mmol/L   CO2 28 20 - 32 mmol/L   Calcium  9.7 8.6 - 10.3 mg/dL   Total Protein 6.7 6.1 - 8.1 g/dL   Albumin 5.0 3.6 - 5.1 g/dL   Globulin 1.7 (L) 1.9 - 3.7 g/dL (calc)   AG Ratio 2.9 (H) 1.0 - 2.5 (calc)   Total Bilirubin 0.4 0.2 - 1.2 mg/dL   Alkaline phosphatase (APISO) 65 36 - 130 U/L   AST 27 10 - 40 U/L   ALT 47 (H) 9 - 46 U/L  Lipid panel   Collection Time: 11/06/24  3:28 PM  Result Value Ref Range   Cholesterol 117 <200 mg/dL   HDL 63 > OR = 40 mg/dL   Triglycerides 64 <849 mg/dL   LDL Cholesterol  (Calc) 40 mg/dL (calc)   Total CHOL/HDL Ratio  1.9 <5.0 (calc)   Non-HDL Cholesterol (Calc) 54 <869 mg/dL (calc)  Hemoglobin J8r   Collection Time: 11/06/24  3:28 PM  Result Value Ref Range   Hgb A1c MFr Bld 5.4 <5.7 %   Mean Plasma Glucose 108 mg/dL   eAG (mmol/L) 6.0 mmol/L  PSA   Collection Time: 11/06/24  3:28 PM  Result Value Ref Range   PSA 0.91 < OR = 4.00 ng/mL          Assessment & Plan:   Problem List Items Addressed This Visit       Nervous and Auditory   Cervical radiculopathy   Relevant Medications   celecoxib  (CELEBREX ) 100 MG capsule   sertraline  (ZOLOFT ) 50 MG tablet     Other   Hyperlipidemia   Relevant Medications   atorvastatin  (LIPITOR) 20 MG tablet   Moderate episode of recurrent major depressive disorder (HCC) - Primary   Relevant Medications   sertraline  (ZOLOFT ) 50 MG tablet   Anxiety   Relevant Medications   sertraline  (ZOLOFT ) 50 MG tablet   Other Visit Diagnoses       Polyarthralgia       Relevant Medications   celecoxib  (CELEBREX ) 100 MG capsule     Neck pain, chronic       Relevant Medications   celecoxib  (CELEBREX ) 100 MG capsule   sertraline  (ZOLOFT ) 50 MG tablet     Exposure to the flu       Relevant Medications   oseltamivir  (TAMIFLU ) 75 MG capsule        Assessment and Plan Assessment & Plan Anxiety disorder Worsening anxiety, possibly exacerbated by concerns about recovery from recent cervical anterior disc arthroplasty and potential need for further surgery. Currently on Zoloft  25 mg daily, which is well-tolerated but insufficient for symptom control. - Increased Zoloft  to 50 mg daily.  Cervical radiculopathy with chronic neck pain Chronic neck pain secondary to cervical radiculopathy, managed with Celebrex . Recent cervical anterior disc arthroplasty in October with concerns about incomplete recovery and potential need for further surgical intervention. Follow-up with neurosurgery scheduled for January 20th, with a  plan to repeat MRI if pain persists. - Continue Celebrex  100 mg twice daily. - Follow up with neurosurgery on January 20th.  General health maintenance Concern about potential flu exposure and interest in having Tamiflu  available for prophylaxis. - Prescribed Tamiflu  for potential flu exposure.    Elevated blood pressure reading -reports that at home his blood pressure is in normal range.      Follow up plan: Return in about 4 weeks (around 01/08/2025) for follow up anxiety. "

## 2024-12-24 ENCOUNTER — Telehealth: Payer: Self-pay | Admitting: Neurosurgery

## 2024-12-24 DIAGNOSIS — M5412 Radiculopathy, cervical region: Secondary | ICD-10-CM

## 2024-12-24 DIAGNOSIS — Z9889 Other specified postprocedural states: Secondary | ICD-10-CM

## 2024-12-24 DIAGNOSIS — Z981 Arthrodesis status: Secondary | ICD-10-CM

## 2024-12-24 NOTE — Telephone Encounter (Signed)
 Patient is calling to see if Dr. Clois would go ahead and order the cervical MRI. He states he is still having pain. He would like to have this done at [facility].

## 2024-12-24 NOTE — Telephone Encounter (Signed)
 Dr. Clois recommends he have cervical MRI done along with EMG Of his upper extremities.   I have ordered the cervical MRI. Does he want me to order EMG? I recommend he have EMG in [facility] with [facility], is that okay?

## 2024-12-24 NOTE — Addendum Note (Signed)
 Addended byBETHA HILMA HASTINGS on: 12/24/2024 05:04 PM   Modules accepted: Orders

## 2024-12-25 ENCOUNTER — Telehealth: Payer: Self-pay | Admitting: Neurosurgery

## 2024-12-25 ENCOUNTER — Other Ambulatory Visit: Payer: Self-pay | Admitting: Orthopedic Surgery

## 2024-12-25 DIAGNOSIS — G959 Disease of spinal cord, unspecified: Secondary | ICD-10-CM

## 2024-12-25 NOTE — Telephone Encounter (Signed)
 I spoke to patient and he is willing to do the EMG.

## 2024-12-25 NOTE — Telephone Encounter (Signed)
 Please get more information about his work- what does he do? Do we need to do any restrictions when he returns?

## 2024-12-25 NOTE — Telephone Encounter (Signed)
 Patient called to request and extension on his return to work. He is requesting a letter that states he can return on 01/13/2025 and this his next appointment with our office is on 12/31/2024. He would like to get this from MyChart.

## 2024-12-25 NOTE — Addendum Note (Signed)
 Addended by: SEBASTIAN ELENOR HERO on: 12/25/2024 09:27 AM   Modules accepted: Orders

## 2024-12-25 NOTE — Telephone Encounter (Signed)
 EMG of bilateral upper extremities ordered at [facility].   They should call him to schedule. Let us  know if he does not hear from them in a few days.

## 2024-12-25 NOTE — Addendum Note (Signed)
 Addended byBETHA HILMA HASTINGS on: 12/25/2024 10:52 AM   Modules accepted: Orders

## 2024-12-25 NOTE — Telephone Encounter (Signed)
 Patient notified.

## 2024-12-26 ENCOUNTER — Telehealth: Payer: Self-pay | Admitting: Neurosurgery

## 2024-12-26 DIAGNOSIS — M5412 Radiculopathy, cervical region: Secondary | ICD-10-CM

## 2024-12-26 DIAGNOSIS — Z981 Arthrodesis status: Secondary | ICD-10-CM

## 2024-12-26 DIAGNOSIS — Z9889 Other specified postprocedural states: Secondary | ICD-10-CM

## 2024-12-26 NOTE — Telephone Encounter (Signed)
 If he is still having arm pain, then we recommend that he have MRI and EMG to evaluate it further. It is up to him if he wants to get them done.   We can also discuss further at his visit with me next week.

## 2024-12-26 NOTE — Telephone Encounter (Signed)
Note sent in MyChart. °

## 2024-12-26 NOTE — Telephone Encounter (Signed)
 Patient called and would like his EMG order sent to [facility] due to his insurance for this year.

## 2024-12-26 NOTE — Addendum Note (Signed)
 Addended byBETHA HILMA HASTINGS on: 12/26/2024 09:03 AM   Modules accepted: Orders

## 2024-12-26 NOTE — Telephone Encounter (Signed)
 Patient notified and wants to discuss it further at his visit.

## 2024-12-26 NOTE — Telephone Encounter (Signed)
 Referral placed as requested. Cancelled referral that was placed to [facility]

## 2024-12-26 NOTE — Addendum Note (Signed)
 Addended byBETHA HILMA HASTINGS on: 12/26/2024 09:05 AM   Modules accepted: Orders

## 2024-12-26 NOTE — Telephone Encounter (Signed)
 He works in the court. Sitting on hard benches. He wants to return on 01/14/2024. He wants to put a office type chair in the court room with a high back. There is a court case on the week of January 26 and they will need to be in a small room and there will be to room to bring a chair so he wants to wait to return 1 more week.

## 2024-12-26 NOTE — Telephone Encounter (Signed)
 EMG orders placed and he wants to go to [facility].   Tell him to let us  know when he has it done so we can work on getting results.

## 2024-12-27 NOTE — Progress Notes (Unsigned)
" ° °  REFERRING PHYSICIAN:  Leavy Mole, Pa-c No address on file  DOS: 10/09/24  cervical disc arthroplasty C3-C4 and ACDF C5-C6  HISTORY OF PRESENT ILLNESS: Joe Patterson is 2 weeks status post above surgery. Given flexeril , medrol  dose pack, and oxycodone  on discharge from the hospital.    He saw Dr. Claudene on 11/18/24- preop EMG on 09/27/24 showed left carpal tunnel syndrome. 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 Cleotilde.***  He was still having left arm pain at his last visit with Dr. Clois on 11/21/24. He discussed repeat EMG and cervical MRI if no improvement.   He called last week about further workup- cervical MRI and EMG of upper extremities was ordered. He is scheduled to return to work on 01/13/25.          Preop neck and bilateral arm pain is about the same. He has expected pain in his shoulder blades. He continues with intermittent numbness, tingling, and weakness. Swallowing is almost back to normal.   He is taking celebrex , flexeril , and lyrica . He is not taking oxycodone . Taking prn tylenol .    PHYSICAL EXAMINATION:  NEUROLOGICAL:  General: In no acute distress.   Awake, alert, oriented to person, place, and time.  Pupils equal round and reactive to light.  Facial tone is symmetric.    Strength: Side Biceps Triceps Deltoid Interossei Grip Wrist Ext. Wrist Flex.  R 5 5 5 5 5 5 5   L 5 5 5 5 5 5 5    Incision c/d/i  Imaging:  Nothing new to review.   Assessment / Plan: DENZELL COLASANTI is doing well s/p above surgery. Treatment options reviewed with patient and following plan made:   - We discussed activity escalation and I have advised the patient to lift up to 10 pounds until 6 weeks after surgery (until follow up with Dr. Clois).   - Reviewed wound care.  - Continue current medications including lyrica , flexeril , and celebrex .  - Follow up as scheduled in 4 weeks and prn.    Advised to contact the office if any questions or concerns arise.    Glade Boys PA-C Dept of Neurosurgery  "

## 2024-12-31 ENCOUNTER — Ambulatory Visit: Admitting: Orthopedic Surgery

## 2024-12-31 ENCOUNTER — Encounter: Payer: Self-pay | Admitting: Orthopedic Surgery

## 2024-12-31 ENCOUNTER — Ambulatory Visit

## 2024-12-31 VITALS — BP 148/104 | Temp 98.4°F | Ht 70.0 in | Wt 197.0 lb

## 2024-12-31 DIAGNOSIS — G959 Disease of spinal cord, unspecified: Secondary | ICD-10-CM | POA: Diagnosis not present

## 2024-12-31 DIAGNOSIS — Z981 Arthrodesis status: Secondary | ICD-10-CM

## 2024-12-31 DIAGNOSIS — M5412 Radiculopathy, cervical region: Secondary | ICD-10-CM

## 2024-12-31 DIAGNOSIS — Z9889 Other specified postprocedural states: Secondary | ICD-10-CM

## 2024-12-31 NOTE — Patient Instructions (Signed)
 It was nice to see you today.   I will review everything with Dr. Claudene and message you about your EMG.   Your blood pressure was elevated today. I want you to recheck it at home and follow up with your PCP if it remains high. If you have any chest pain, shortness of breath, blurry vision, or headaches then you need to go to ED. We have also sent your PCP a message to let them know about your elevated blood pressure.    Dr. Clois will see you back in 3 months.   Please call with any questions or concerns.   Glade Boys PA-C [phone]     The physicians and staff at [facility] at [facility] are committed to providing excellent care. You may receive a survey asking for feedback about your experience at our office. We value you your feedback and appreciate you taking the time to to fill it out. The [facility] leadership team is also available to discuss your experience in person, feel free to contact us  [phone].

## 2025-01-02 ENCOUNTER — Encounter: Admitting: Orthopedic Surgery

## 2025-01-02 ENCOUNTER — Other Ambulatory Visit

## 2025-01-07 ENCOUNTER — Telehealth: Payer: Self-pay | Admitting: Neurosurgery

## 2025-01-07 NOTE — Telephone Encounter (Signed)
 Completed and emailed to patient

## 2025-01-07 NOTE — Telephone Encounter (Signed)
 Patient called stating he emailed a central job functions form/checklist to Dr.Yarbrough.  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 Please advise

## 2025-01-08 ENCOUNTER — Other Ambulatory Visit

## 2025-01-08 NOTE — Telephone Encounter (Signed)
 EMG of bilateral upper extremities dated 01/08/25:   Results scanned under media.

## 2025-01-08 NOTE — Telephone Encounter (Signed)
 DOS: 10/09/24 cervical disc arthroplasty C3-C4 and ACDF C5-C6

## 2025-01-17 ENCOUNTER — Encounter: Payer: Self-pay | Admitting: Neurology

## 2025-04-01 ENCOUNTER — Ambulatory Visit: Admitting: Neurosurgery
